# Patient Record
Sex: Male | Born: 1955
Health system: Southern US, Community
[De-identification: ages and names within clinical notes are randomized; demographics above are authoritative.]

## PROBLEM LIST (undated history)

## (undated) DIAGNOSIS — Z87448 Personal history of other diseases of urinary system: Secondary | ICD-10-CM

## (undated) DIAGNOSIS — N2581 Secondary hyperparathyroidism of renal origin: Secondary | ICD-10-CM

## (undated) DIAGNOSIS — I10 Essential (primary) hypertension: Secondary | ICD-10-CM

## (undated) DIAGNOSIS — E785 Hyperlipidemia, unspecified: Secondary | ICD-10-CM

## (undated) DIAGNOSIS — I639 Cerebral infarction, unspecified: Secondary | ICD-10-CM

## (undated) DIAGNOSIS — E0500 Thyrotoxicosis with diffuse goiter without thyrotoxic crisis or storm: Secondary | ICD-10-CM

## (undated) DIAGNOSIS — N059 Unspecified nephritic syndrome with unspecified morphologic changes: Secondary | ICD-10-CM

## (undated) DIAGNOSIS — R339 Retention of urine, unspecified: Secondary | ICD-10-CM

## (undated) DIAGNOSIS — H9190 Unspecified hearing loss, unspecified ear: Secondary | ICD-10-CM

## (undated) HISTORY — PX: OTHER SURGICAL HISTORY: SHX169

## (undated) HISTORY — DX: Secondary hyperparathyroidism of renal origin: N25.81

## (undated) HISTORY — DX: Cerebral infarction, unspecified: I63.9

## (undated) HISTORY — DX: Essential (primary) hypertension: I10

## (undated) HISTORY — DX: Hyperlipidemia, unspecified: E78.5

## (undated) HISTORY — DX: Personal history of other diseases of urinary system: Z87.448

## (undated) HISTORY — DX: Retention of urine, unspecified: R33.9

## (undated) HISTORY — DX: Unspecified hearing loss, unspecified ear: H91.90

## (undated) HISTORY — DX: Thyrotoxicosis with diffuse goiter without thyrotoxic crisis or storm: E05.00

## (undated) HISTORY — DX: Unspecified nephritic syndrome with unspecified morphologic changes: N05.9

---

## 1998-01-24 ENCOUNTER — Ambulatory Visit (HOSPITAL_COMMUNITY): Admission: RE | Admit: 1998-01-24 | Discharge: 1998-01-24 | Payer: Self-pay | Admitting: *Deleted

## 2002-11-07 ENCOUNTER — Encounter: Admission: RE | Admit: 2002-11-07 | Discharge: 2002-11-07 | Payer: Self-pay | Admitting: Otolaryngology

## 2002-11-07 ENCOUNTER — Encounter: Payer: Self-pay | Admitting: Otolaryngology

## 2003-03-13 ENCOUNTER — Emergency Department (HOSPITAL_COMMUNITY): Admission: AD | Admit: 2003-03-13 | Discharge: 2003-03-13 | Payer: Self-pay | Admitting: Emergency Medicine

## 2004-01-28 ENCOUNTER — Emergency Department (HOSPITAL_COMMUNITY): Admission: EM | Admit: 2004-01-28 | Discharge: 2004-01-28 | Payer: Self-pay | Admitting: Emergency Medicine

## 2005-06-06 LAB — HM COLONOSCOPY: HM Colonoscopy: 2

## 2005-06-27 ENCOUNTER — Encounter: Payer: Self-pay | Admitting: Internal Medicine

## 2006-01-26 ENCOUNTER — Ambulatory Visit: Payer: Self-pay | Admitting: Family Medicine

## 2006-01-26 ENCOUNTER — Ambulatory Visit: Payer: Self-pay | Admitting: Cardiology

## 2006-01-30 ENCOUNTER — Ambulatory Visit: Payer: Self-pay | Admitting: Family Medicine

## 2006-02-13 ENCOUNTER — Ambulatory Visit: Payer: Self-pay | Admitting: Family Medicine

## 2006-05-08 ENCOUNTER — Ambulatory Visit: Payer: Self-pay | Admitting: Family Medicine

## 2006-06-05 ENCOUNTER — Encounter: Admission: RE | Admit: 2006-06-05 | Discharge: 2006-09-03 | Payer: Self-pay | Admitting: Family Medicine

## 2006-08-23 DIAGNOSIS — I10 Essential (primary) hypertension: Secondary | ICD-10-CM | POA: Insufficient documentation

## 2006-08-23 DIAGNOSIS — E785 Hyperlipidemia, unspecified: Secondary | ICD-10-CM | POA: Insufficient documentation

## 2007-02-10 ENCOUNTER — Encounter (INDEPENDENT_AMBULATORY_CARE_PROVIDER_SITE_OTHER): Payer: Self-pay | Admitting: *Deleted

## 2007-08-23 ENCOUNTER — Telehealth (INDEPENDENT_AMBULATORY_CARE_PROVIDER_SITE_OTHER): Payer: Self-pay | Admitting: *Deleted

## 2007-08-30 ENCOUNTER — Ambulatory Visit: Payer: Self-pay | Admitting: Family Medicine

## 2007-08-30 LAB — CONVERTED CEMR LAB
Bilirubin Urine: NEGATIVE
Glucose, Urine, Semiquant: NEGATIVE
Ketones, urine, test strip: NEGATIVE
Protein, U semiquant: >=300
Specific Gravity, Urine: 1.025
Urobilinogen, UA: 0.2
pH: 5

## 2007-08-31 ENCOUNTER — Encounter (INDEPENDENT_AMBULATORY_CARE_PROVIDER_SITE_OTHER): Payer: Self-pay | Admitting: Family Medicine

## 2007-08-31 LAB — CONVERTED CEMR LAB
ALT: 29 units/L (ref 0–53)
AST: 21 units/L (ref 0–37)
BUN: 21 mg/dL (ref 6–23)
CO2: 29 meq/L (ref 19–32)
Calcium: 9.2 mg/dL (ref 8.4–10.5)
Chloride: 102 meq/L (ref 96–112)
Cholesterol: 206 mg/dL (ref 0–200)
Creatinine, Ser: 1.1 mg/dL (ref 0.4–1.5)
Direct LDL: 108.7 mg/dL
GFR calc Af Amer: 90 mL/min
GFR calc non Af Amer: 74 mL/min
Glucose, Bld: 156 mg/dL — ABNORMAL HIGH (ref 70–99)
HDL: 38.1 mg/dL — ABNORMAL LOW (ref >39.0)
Potassium: 3.6 meq/L (ref 3.5–5.1)
Sodium: 138 meq/L (ref 135–145)
Total CHOL/HDL Ratio: 5.4
Triglycerides: 282 mg/dL (ref 0–149)
VLDL: 56 mg/dL — ABNORMAL HIGH (ref 0–40)

## 2007-09-01 ENCOUNTER — Telehealth (INDEPENDENT_AMBULATORY_CARE_PROVIDER_SITE_OTHER): Payer: Self-pay | Admitting: *Deleted

## 2007-09-03 ENCOUNTER — Ambulatory Visit: Payer: Self-pay | Admitting: Family Medicine

## 2007-09-07 LAB — CONVERTED CEMR LAB
Direct LDL: 139.6 mg/dL
Glucose, Bld: 138 mg/dL — ABNORMAL HIGH (ref 70–99)
Triglycerides: 286 mg/dL (ref 0–149)

## 2007-09-08 ENCOUNTER — Encounter (INDEPENDENT_AMBULATORY_CARE_PROVIDER_SITE_OTHER): Payer: Self-pay | Admitting: Family Medicine

## 2007-09-08 ENCOUNTER — Telehealth (INDEPENDENT_AMBULATORY_CARE_PROVIDER_SITE_OTHER): Payer: Self-pay | Admitting: *Deleted

## 2007-09-17 ENCOUNTER — Ambulatory Visit: Payer: Self-pay | Admitting: Family Medicine

## 2007-09-20 ENCOUNTER — Encounter (INDEPENDENT_AMBULATORY_CARE_PROVIDER_SITE_OTHER): Payer: Self-pay | Admitting: *Deleted

## 2007-09-23 LAB — CONVERTED CEMR LAB
Creatinine,U: 136.9 mg/dL
Hgb A1c MFr Bld: 7.1 % — ABNORMAL HIGH (ref 4.6–6.0)
Microalb Creat Ratio: 325.8 mg/g — ABNORMAL HIGH (ref 0.0–30.0)
Microalb, Ur: 44.6 mg/dL — ABNORMAL HIGH (ref 0.0–1.9)

## 2007-09-24 ENCOUNTER — Encounter: Payer: Self-pay | Admitting: Internal Medicine

## 2007-10-01 ENCOUNTER — Ambulatory Visit: Payer: Self-pay | Admitting: Internal Medicine

## 2007-10-01 DIAGNOSIS — E119 Type 2 diabetes mellitus without complications: Secondary | ICD-10-CM | POA: Insufficient documentation

## 2007-10-22 ENCOUNTER — Encounter (INDEPENDENT_AMBULATORY_CARE_PROVIDER_SITE_OTHER): Payer: Self-pay | Admitting: *Deleted

## 2007-10-25 ENCOUNTER — Telehealth (INDEPENDENT_AMBULATORY_CARE_PROVIDER_SITE_OTHER): Payer: Self-pay | Admitting: *Deleted

## 2007-11-12 ENCOUNTER — Ambulatory Visit: Payer: Self-pay | Admitting: Internal Medicine

## 2007-11-12 LAB — CONVERTED CEMR LAB: Blood Glucose, Fasting: 130 mg/dL

## 2007-12-08 ENCOUNTER — Telehealth (INDEPENDENT_AMBULATORY_CARE_PROVIDER_SITE_OTHER): Payer: Self-pay | Admitting: *Deleted

## 2007-12-18 ENCOUNTER — Emergency Department (HOSPITAL_COMMUNITY): Admission: EM | Admit: 2007-12-18 | Discharge: 2007-12-18 | Payer: Self-pay | Admitting: Emergency Medicine

## 2007-12-20 ENCOUNTER — Emergency Department (HOSPITAL_COMMUNITY): Admission: EM | Admit: 2007-12-20 | Discharge: 2007-12-20 | Payer: Self-pay | Admitting: Emergency Medicine

## 2008-01-04 ENCOUNTER — Ambulatory Visit: Payer: Self-pay | Admitting: Internal Medicine

## 2008-01-04 LAB — CONVERTED CEMR LAB: Hemoglobin: 16.2 g/dL

## 2008-01-28 ENCOUNTER — Ambulatory Visit: Payer: Self-pay | Admitting: Internal Medicine

## 2008-02-02 ENCOUNTER — Telehealth (INDEPENDENT_AMBULATORY_CARE_PROVIDER_SITE_OTHER): Payer: Self-pay | Admitting: *Deleted

## 2008-02-02 LAB — CONVERTED CEMR LAB
BUN: 15 mg/dL (ref 6–23)
CO2: 28 meq/L (ref 19–32)
Calcium: 9 mg/dL (ref 8.4–10.5)
Chloride: 104 meq/L (ref 96–112)
Creatinine, Ser: 0.9 mg/dL (ref 0.4–1.5)
Creatinine,U: 161.4 mg/dL
GFR calc Af Amer: 114 mL/min
GFR calc non Af Amer: 94 mL/min
Glucose, Bld: 130 mg/dL — ABNORMAL HIGH (ref 70–99)
Hgb A1c MFr Bld: 6 % (ref 4.6–6.0)
Microalb, Ur: 322.1 mg/dL — ABNORMAL HIGH (ref 0.0–1.9)
Potassium: 3.4 meq/L — ABNORMAL LOW (ref 3.5–5.1)
Sodium: 139 meq/L (ref 135–145)

## 2008-05-26 ENCOUNTER — Ambulatory Visit: Payer: Self-pay | Admitting: Internal Medicine

## 2008-05-30 ENCOUNTER — Telehealth (INDEPENDENT_AMBULATORY_CARE_PROVIDER_SITE_OTHER): Payer: Self-pay | Admitting: *Deleted

## 2008-05-30 LAB — CONVERTED CEMR LAB
ALT: 24 units/L (ref 0–53)
AST: 23 units/L (ref 0–37)
Cholesterol: 216 mg/dL (ref 0–200)
Creatinine,U: 157.2 mg/dL
Direct LDL: 125.1 mg/dL
HDL: 51.6 mg/dL (ref >39.0)
Hgb A1c MFr Bld: 6.2 % — ABNORMAL HIGH (ref 4.6–6.0)
Microalb Creat Ratio: 1848.6 mg/g — ABNORMAL HIGH (ref 0.0–30.0)
Microalb, Ur: 290.6 mg/dL — ABNORMAL HIGH (ref 0.0–1.9)
Total CHOL/HDL Ratio: 4.2
Triglycerides: 107 mg/dL (ref 0–149)
VLDL: 21 mg/dL (ref 0–40)

## 2008-09-08 ENCOUNTER — Ambulatory Visit: Payer: Self-pay | Admitting: Internal Medicine

## 2008-09-13 ENCOUNTER — Telehealth (INDEPENDENT_AMBULATORY_CARE_PROVIDER_SITE_OTHER): Payer: Self-pay | Admitting: *Deleted

## 2008-09-13 LAB — CONVERTED CEMR LAB
BUN: 16 mg/dL (ref 6–23)
CO2: 29 meq/L (ref 19–32)
Calcium: 8.9 mg/dL (ref 8.4–10.5)
Chloride: 104 meq/L (ref 96–112)
Creatinine, Ser: 1.1 mg/dL (ref 0.4–1.5)
Creatinine,U: 118.6 mg/dL
GFR calc Af Amer: 90 mL/min
GFR calc non Af Amer: 74 mL/min
Glucose, Bld: 157 mg/dL — ABNORMAL HIGH (ref 70–99)
Hgb A1c MFr Bld: 6.7 % — ABNORMAL HIGH (ref 4.6–6.0)
Microalb Creat Ratio: 728.5 mg/g — ABNORMAL HIGH (ref 0.0–30.0)
Microalb, Ur: 86.4 mg/dL — ABNORMAL HIGH (ref 0.0–1.9)
Potassium: 3.6 meq/L (ref 3.5–5.1)
Sodium: 138 meq/L (ref 135–145)

## 2008-09-22 ENCOUNTER — Encounter: Payer: Self-pay | Admitting: Internal Medicine

## 2008-11-20 ENCOUNTER — Encounter: Payer: Self-pay | Admitting: Internal Medicine

## 2008-12-05 DIAGNOSIS — N059 Unspecified nephritic syndrome with unspecified morphologic changes: Secondary | ICD-10-CM

## 2008-12-05 HISTORY — DX: Unspecified nephritic syndrome with unspecified morphologic changes: N05.9

## 2008-12-08 ENCOUNTER — Ambulatory Visit (HOSPITAL_COMMUNITY): Admission: RE | Admit: 2008-12-08 | Discharge: 2008-12-08 | Payer: Self-pay | Admitting: Nephrology

## 2008-12-08 ENCOUNTER — Telehealth (INDEPENDENT_AMBULATORY_CARE_PROVIDER_SITE_OTHER): Payer: Self-pay | Admitting: *Deleted

## 2008-12-08 ENCOUNTER — Encounter (INDEPENDENT_AMBULATORY_CARE_PROVIDER_SITE_OTHER): Payer: Self-pay | Admitting: Interventional Radiology

## 2008-12-12 ENCOUNTER — Telehealth: Payer: Self-pay | Admitting: Internal Medicine

## 2008-12-29 ENCOUNTER — Ambulatory Visit: Payer: Self-pay | Admitting: Internal Medicine

## 2008-12-29 DIAGNOSIS — R809 Proteinuria, unspecified: Secondary | ICD-10-CM | POA: Insufficient documentation

## 2008-12-29 LAB — HM DIABETES FOOT EXAM

## 2009-01-01 LAB — CONVERTED CEMR LAB
BUN: 15 mg/dL (ref 6–23)
Basophils Absolute: 0 10*3/uL (ref 0.0–0.1)
Basophils Relative: 0.4 % (ref 0.0–3.0)
CO2: 28 meq/L (ref 19–32)
Calcium: 8.7 mg/dL (ref 8.4–10.5)
Chloride: 105 meq/L (ref 96–112)
Creatinine, Ser: 1 mg/dL (ref 0.4–1.5)
Eosinophils Absolute: 0.1 10*3/uL (ref 0.0–0.7)
Eosinophils Relative: 1 % (ref 0.0–5.0)
GFR calc non Af Amer: 82.66 mL/min (ref >60)
Glucose, Bld: 165 mg/dL — ABNORMAL HIGH (ref 70–99)
HCT: 40.8 % (ref 39.0–52.0)
Hemoglobin: 14.7 g/dL (ref 13.0–17.0)
Hgb A1c MFr Bld: 6.3 % (ref 4.6–6.5)
Lymphocytes Relative: 24.6 % (ref 12.0–46.0)
Lymphs Abs: 1.4 10*3/uL (ref 0.7–4.0)
MCHC: 35.9 g/dL (ref 30.0–36.0)
MCV: 84.5 fL (ref 78.0–100.0)
Monocytes Absolute: 0.4 10*3/uL (ref 0.1–1.0)
Monocytes Relative: 6.4 % (ref 3.0–12.0)
Neutro Abs: 3.8 10*3/uL (ref 1.4–7.7)
Neutrophils Relative %: 67.6 % (ref 43.0–77.0)
Platelets: 193 10*3/uL (ref 150.0–400.0)
Potassium: 3.5 meq/L (ref 3.5–5.1)
RBC: 4.83 M/uL (ref 4.22–5.81)
RDW: 12.5 % (ref 11.5–14.6)
Sodium: 139 meq/L (ref 135–145)
TSH: 1.64 microintl units/mL (ref 0.35–5.50)
WBC: 5.7 10*3/uL (ref 4.5–10.5)

## 2009-01-02 ENCOUNTER — Encounter: Admission: RE | Admit: 2009-01-02 | Discharge: 2009-01-02 | Payer: Self-pay | Admitting: Nephrology

## 2009-01-02 ENCOUNTER — Encounter: Payer: Self-pay | Admitting: Internal Medicine

## 2009-01-03 LAB — CONVERTED CEMR LAB: PSA: 1.6 ng/mL

## 2009-01-04 DIAGNOSIS — R339 Retention of urine, unspecified: Secondary | ICD-10-CM

## 2009-01-04 HISTORY — DX: Retention of urine, unspecified: R33.9

## 2009-01-11 ENCOUNTER — Encounter: Payer: Self-pay | Admitting: Internal Medicine

## 2009-01-16 ENCOUNTER — Ambulatory Visit: Payer: Self-pay | Admitting: Internal Medicine

## 2009-01-16 DIAGNOSIS — N052 Unspecified nephritic syndrome with diffuse membranous glomerulonephritis: Secondary | ICD-10-CM | POA: Insufficient documentation

## 2009-01-16 DIAGNOSIS — B009 Herpesviral infection, unspecified: Secondary | ICD-10-CM | POA: Insufficient documentation

## 2009-01-16 DIAGNOSIS — R3919 Other difficulties with micturition: Secondary | ICD-10-CM | POA: Insufficient documentation

## 2009-01-17 ENCOUNTER — Emergency Department (HOSPITAL_COMMUNITY): Admission: EM | Admit: 2009-01-17 | Discharge: 2009-01-17 | Payer: Self-pay | Admitting: Emergency Medicine

## 2009-01-19 ENCOUNTER — Encounter: Payer: Self-pay | Admitting: Internal Medicine

## 2009-01-26 ENCOUNTER — Encounter: Payer: Self-pay | Admitting: Internal Medicine

## 2009-02-01 ENCOUNTER — Encounter: Payer: Self-pay | Admitting: Internal Medicine

## 2009-08-31 ENCOUNTER — Ambulatory Visit: Payer: Self-pay | Admitting: Internal Medicine

## 2009-08-31 DIAGNOSIS — M25569 Pain in unspecified knee: Secondary | ICD-10-CM | POA: Insufficient documentation

## 2009-09-06 LAB — CONVERTED CEMR LAB
BUN: 25 mg/dL — ABNORMAL HIGH (ref 6–23)
CO2: 23 meq/L (ref 19–32)
Calcium: 9.2 mg/dL (ref 8.4–10.5)
Chloride: 106 meq/L (ref 96–112)
Creatinine, Ser: 1.4 mg/dL (ref 0.40–1.50)
Glucose, Bld: 120 mg/dL — ABNORMAL HIGH (ref 70–99)
Hgb A1c MFr Bld: 6 % (ref 4.6–6.1)
Potassium: 4 meq/L (ref 3.5–5.3)
Sodium: 141 meq/L (ref 135–145)

## 2009-09-21 ENCOUNTER — Encounter: Payer: Self-pay | Admitting: Internal Medicine

## 2009-11-20 ENCOUNTER — Ambulatory Visit: Payer: Self-pay | Admitting: Internal Medicine

## 2009-11-20 DIAGNOSIS — E0500 Thyrotoxicosis with diffuse goiter without thyrotoxic crisis or storm: Secondary | ICD-10-CM | POA: Insufficient documentation

## 2009-11-21 ENCOUNTER — Telehealth: Payer: Self-pay | Admitting: Internal Medicine

## 2009-11-23 LAB — CONVERTED CEMR LAB
ALT: 26 units/L (ref 0–53)
AST: 23 units/L (ref 0–37)
BUN: 20 mg/dL (ref 6–23)
Basophils Absolute: 0 10*3/uL (ref 0.0–0.1)
Basophils Relative: 0.5 % (ref 0.0–3.0)
CO2: 28 meq/L (ref 19–32)
Calcium: 9.2 mg/dL (ref 8.4–10.5)
Chloride: 100 meq/L (ref 96–112)
Cholesterol: 247 mg/dL — ABNORMAL HIGH (ref 0–200)
Creatinine, Ser: 1.1 mg/dL (ref 0.4–1.5)
Direct LDL: 157.5 mg/dL
Eosinophils Absolute: 0.1 10*3/uL (ref 0.0–0.7)
Eosinophils Relative: 1.1 % (ref 0.0–5.0)
GFR calc non Af Amer: 72.28 mL/min (ref >60)
Glucose, Bld: 100 mg/dL — ABNORMAL HIGH (ref 70–99)
HCT: 44.2 % (ref 39.0–52.0)
HDL: 53.6 mg/dL (ref >39.00)
Hemoglobin: 15.2 g/dL (ref 13.0–17.0)
Hgb A1c MFr Bld: 5.8 % (ref 4.6–6.5)
Lymphocytes Relative: 27.2 % (ref 12.0–46.0)
Lymphs Abs: 1.9 10*3/uL (ref 0.7–4.0)
MCHC: 34.4 g/dL (ref 30.0–36.0)
MCV: 88.5 fL (ref 78.0–100.0)
Monocytes Absolute: 0.4 10*3/uL (ref 0.1–1.0)
Monocytes Relative: 5.9 % (ref 3.0–12.0)
Neutro Abs: 4.5 10*3/uL (ref 1.4–7.7)
Neutrophils Relative %: 65.3 % (ref 43.0–77.0)
PSA: 1.95 ng/mL (ref 0.10–4.00)
Platelets: 214 10*3/uL (ref 150.0–400.0)
Potassium: 3.9 meq/L (ref 3.5–5.1)
RBC: 4.99 M/uL (ref 4.22–5.81)
RDW: 13.5 % (ref 11.5–14.6)
Sodium: 139 meq/L (ref 135–145)
Total CHOL/HDL Ratio: 5
Triglycerides: 191 mg/dL — ABNORMAL HIGH (ref 0.0–149.0)
VLDL: 38.2 mg/dL (ref 0.0–40.0)
WBC: 6.8 10*3/uL (ref 4.5–10.5)

## 2009-12-21 ENCOUNTER — Encounter: Admission: RE | Admit: 2009-12-21 | Discharge: 2009-12-21 | Payer: Self-pay | Admitting: Internal Medicine

## 2010-05-14 ENCOUNTER — Encounter (INDEPENDENT_AMBULATORY_CARE_PROVIDER_SITE_OTHER): Payer: Self-pay | Admitting: *Deleted

## 2010-06-07 ENCOUNTER — Ambulatory Visit: Payer: Self-pay | Admitting: Internal Medicine

## 2010-06-13 LAB — CONVERTED CEMR LAB
Cholesterol: 248 mg/dL — ABNORMAL HIGH (ref 0–200)
HDL: 54 mg/dL (ref >39)
Hgb A1c MFr Bld: 6 % — ABNORMAL HIGH (ref <5.7)
LDL Cholesterol: 165 mg/dL — ABNORMAL HIGH (ref 0–99)
Total CHOL/HDL Ratio: 4.6
Triglycerides: 147 mg/dL (ref <150)
VLDL: 29 mg/dL (ref 0–40)

## 2010-08-08 NOTE — Assessment & Plan Note (Signed)
Summary: CONSTIPATION/KDC   Vital Signs:  Patient profile:   55 year old male Height:      73 inches Weight:      230 pounds Temp:     99 degrees F oral Pulse rate:   60 / minute Pulse rhythm:   regular BP sitting:   160 / 100  (left arm) Cuff size:   large  Vitals Entered By: Shary Decamp (January 16, 2009 1:59 PM) CC: constipation Is Patient Diabetic? Yes   History of Present Illness: Several issues: -- had constipation last week, now better w/ MOM -- developed a rash R from the anal area, no pain or itching, went to [facility], results form the UC reviewed:   a test confirmed Herpes in the perianal lesions, was Rx valtrex --has some difficulty voiding x few days  -- recent OV note and labs from nephrology reviewed: was Dx w/ Bx proven GN  , his nephrologist would like to "r/o malignancy"   Current Medications (verified): 1)  Simvastatin 80 Mg Tabs (Simvastatin) .Marland Kitchen.. 1 By Mouth Qd 2)  Lisinopril 5 Mg Tabs (Lisinopril) .Marland Kitchen.. 1 By Mouth Once Daily 3)  Freestyle Lite Test   Strp (Glucose Blood) .... Check Blood Sugar 2x/day 4)  Freestyle Lite Lancets .... Check Blood Sugar 2x/day 5)  Aspirin 81 Mg Tbec (Aspirin) .Marland Kitchen.. 1 A Day  Allergies (verified): 1)  ! Penicillin  Past History:  Past Medical History: AODM Dx 2009 HYPERTENSION   Bx proven membranous glomerulonephritis ----Dx 12-2008 HYPERLIPIDEMIA   h/o hematuria, saw urology 3-09: neg w/u screening Cscope 12-06 Dr Bosie Clos:  2 polyps , repeat in 5 years per report    Review of Systems General:  Denies chills and fever. CV:  Denies swelling of feet. GI:  Denies bloody stools, nausea, and vomiting. GU:  Denies dysuria, hematuria, and urinary frequency.  Physical Exam  General:  alert and well-developed.   Lungs:  normal respiratory effort, no intercostal retractions, no accessory muscle use, and normal breath sounds.   Heart:  normal rate, regular rhythm, and no murmur.   Abdomen:  soft, non-tender, no distention, no  masses, no guarding, and no rigidity.   Rectal:  No external abnormalities noted. Normal sphincter tone. No rectal masses or tenderness. 2 superficial red lesion aprox. 2mm noted R from the anus  Prostate:  Prostate gland firm and smooth, slt  enlargement, no nodularity, tenderness, mass, asymmetry or induration.   Impression & Recommendations:  Problem # 1:  HERPES SIMPLEX INFECTION (ICD-054.9) Assessment New herpex around the anus, rec continue  w/ valtrex   Problem # 2:  VOIDING HESITANCY (ZOX-096.04) Assessment: New related to #1? prostatitis? recent PSA 1.6 unable to get a Urine specimen today, see instructions  consider Abx ,pt to call if sx worsen   Problem # 3:  MEMBRANOUS GLOMERULONEPHRITIS (ICD-583.1) Assessment: New new Dx , nephrology suggested "r/o malignancy" , otherwise conservative treatment  recent PSA ok patient reports they did a CXR and a thyroid u/s , results? needs a Cscope : advised to get a physical to discuss further  Problem # 4:  HYPERTENSION (ICD-401.9) BP slightly  elevated today, usually ok , no change  His updated medication list for this problem includes:    Lisinopril 5 Mg Tabs (Lisinopril) .Marland Kitchen... 1 by mouth once daily  BP today: 160/100 Prior BP: 138/76 (12/29/2008)  Labs Reviewed: K+: 3.5 (12/29/2008) Creat: : 1.0 (12/29/2008)   Chol: 216 (05/26/2008)   HDL: 51.6 (05/26/2008)   LDL: DEL (  05/26/2008)   TG: 107 (05/26/2008)  Complete Medication List: 1)  Simvastatin 80 Mg Tabs (Simvastatin) .Marland Kitchen.. 1 by mouth qd 2)  Lisinopril 5 Mg Tabs (Lisinopril) .Marland Kitchen.. 1 by mouth once daily 3)  Freestyle Lite Test Strp (Glucose blood) .... Check blood sugar 2x/day 4)  Freestyle Lite Lancets  .... Check blood sugar 2x/day 5)  Aspirin 81 Mg Tbec (Aspirin) .Marland Kitchen.. 1 a day 6)  Flomax 0.4 Mg Xr24h-cap (Tamsulosin hcl) .Marland Kitchen.. 1 by mouth  once daily  Patient Instructions: 1)  bring a urine sample 2)  UA UCX  --- Dx 788.69 3)  came back for a physical in 1  month Prescriptions: FLOMAX 0.4 MG XR24H-CAP (TAMSULOSIN HCL) 1 by mouth  once daily  #30 x 1   Entered and Authorized by:   Elita Quick E. Nakeysha Pasqual MD   Signed by:   Nolon Rod. Tokiko Diefenderfer MD on 01/16/2009   Method used:   Print then Give to Patient   RxID:   [id number]

## 2010-08-08 NOTE — Assessment & Plan Note (Signed)
Summary: 6 month followup--bumped from 11/18///sph   Vital Signs:  Patient profile:   55 year old male Weight:      225.25 pounds Pulse rate:   92 / minute Pulse rhythm:   regular BP sitting:   134 / 88  (left arm) Cuff size:   large  Vitals Entered By: Army Fossa CMA (June 07, 2010 2:27 PM) CC: Pt here for 6 month f/u- not ate since 5am. Comments [facility] [address]   History of Present Illness:  ROV  AODM-- diet only , no recent ambulatory CBGs  HYPERTENSION  -- ambulatory BPs < 139/85  membranous glomerulonephritis , to be seen again by nephroilogy  ~ 07-2010    Current Medications (verified): 1)  Lisinopril 5 Mg Tabs (Lisinopril) .Marland Kitchen.. 1 By Mouth Once Daily  Allergies (verified): 1)  ! Penicillin  Past History:  Past Medical History: AODM Dx 2009 HYPERTENSION   Bx proven membranous glomerulonephritis ----Dx 12-2008 HYPERLIPIDEMIA   h/o hematuria, saw urology 3-09: neg w/u history of urinary retention 01/2009, was seen at the ER and followup by urology screening Cscope 12-06 Dr Bosie Clos:  2 polyps , repeat in 5 years per report   reportedly, history of Graves' disease in the past--u/s 6-11 slightly  enlarged gland w/o nodules  Past Surgical History: Reviewed history from 11/20/2009 and no changes required. no major surgeries   Social History: Reviewed history from 11/20/2009 and no changes required. 3 children Married original from [place] moved to [place] in the 80s works , Psychologist, occupational quit tobaco in the 90s, used to smoke aprox 1 ppd   Review of Systems CV:  Denies chest pain or discomfort and swelling of feet. GU:  Denies dysuria, hematuria, urinary frequency, and urinary hesitancy.  Physical Exam  General:  alert and well-developed.   Lungs:  normal respiratory effort, no intercostal retractions, no accessory muscle use, and normal breath sounds.   Heart:  normal rate, regular rhythm, and no murmur.    Extremities:  no lower extremity  edema   Impression & Recommendations:  Problem # 1:  Hx of GRAVES' DISEASE (ICD-242.00) u/s 6-11 showed enlarged thyroid w/o nodules   Problem # 2:  KNEE PAIN, ACUTE (ICD-719.46) Resolved, saw ortho, had a local shot per patient   Problem # 3:  AODM (ICD-250.00)  diet only  His updated medication list for this problem includes:    Lisinopril 5 Mg Tabs (Lisinopril) .Marland Kitchen... 1 by mouth once daily    Aspirin 81 Mg Tbec (Aspirin) .Marland Kitchen... 1 a day  Labs Reviewed: Creat: 1.1 (11/20/2009)    Reviewed HgBA1c results: 5.8 (11/20/2009)  6.0 (08/31/2009)  Orders: Specimen Handling (09604)  Problem # 4:  HYPERLIPIDEMIA (ICD-272.4)  fast > 8 hours, labs  Labs Reviewed: SGOT: 23 (11/20/2009)   SGPT: 26 (11/20/2009)   HDL:53.60 (11/20/2009), 51.6 (05/26/2008)  LDL:DEL (05/26/2008), DEL (08/30/2007)  Chol:247 (11/20/2009), 216 (05/26/2008)  Trig:191.0 (11/20/2009), 107 (05/26/2008)  Orders: Venipuncture (54098) Specimen Handling (11914)  Complete Medication List: 1)  Lisinopril 5 Mg Tabs (Lisinopril) .Marland Kitchen.. 1 by mouth once daily 2)  Freestyle Lite Test Strp (Glucose blood) .... Check blood sugar 2x/day 3)  Freestyle Lite Lancets  .... Check blood sugar 2x/day 4)  Aspirin 81 Mg Tbec (Aspirin) .Marland Kitchen.. 1 a day  Patient Instructions: 1)  Please schedule a follow-up appointment in 6  months .  Prescriptions: LISINOPRIL 5 MG TABS (LISINOPRIL) 1 by mouth once daily  #30 Each x 5   Entered by:   Duwayne Heck  Barmer CMA   Authorized by:   Nolon Rod. Paz MD   Signed by:   Army Fossa CMA on 06/07/2010   Method used:   Electronically to        [facility] [phone]* (retail)       [address]       [place], [place]  [address]       Ph: [phone]       Fax: [phone]   RxID:   [id number]    Orders Added: 1)  Venipuncture [44034] 2)  Specimen Handling [99000] 3)  Est. Patient Level III [74259]   Immunization History:  Influenza Immunization History:    Influenza:  historical  (05/07/2010)   Immunization History:  Influenza Immunization History:    Influenza:  Historical (05/07/2010)

## 2010-08-08 NOTE — Progress Notes (Signed)
Summary: labs  Phone Note Outgoing Call   Summary of Call: [facility] All labs good, LDL slightly elevated, recommend diet and recheck FLP in 3 months. We'll send a letter as well. Mazi Brailsford E. Adabella Stanis MD  Nov 23, 2009 11:05 AM

## 2010-08-08 NOTE — Assessment & Plan Note (Signed)
Summary: CPX/KDC   Vital Signs:  Patient profile:   55 year old male Height:      73 inches Weight:      222 pounds BMI:     29.40 Pulse rate:   64 / minute BP sitting:   120 / 90  Vitals Entered By: Kandice Hams (Nov 20, 2009 12:59 PM) CC: cpx//  need refill of lancets and strips   History of Present Illness: CPX  still has knee pain, saw ortho, taking meloxicam   Allergies: 1)  ! Penicillin  Past History:  Past Medical History: AODM Dx 2009 HYPERTENSION   Bx proven membranous glomerulonephritis ----Dx 12-2008 HYPERLIPIDEMIA   h/o hematuria, saw urology 3-09: neg w/u history of urinary retention 01/2009, was seen at the ER and followup by urology screening Cscope 12-06 Dr Bosie Clos:  2 polyps , repeat in 5 years per report   reportedly, history of Graves' disease in the past  Past Surgical History: no major surgeries   Family History: DM-- GM MI-- no colon ca-- no prostate ca- no  Social History: 3 children Married original from [place] moved to [place] in the 80s works , Psychologist, occupational quit tobaco in the 90s, used to smoke aprox 1 ppd   Review of Systems CV:  Denies chest pain or discomfort and swelling of feet. Resp:  Denies cough and shortness of breath. GI:  Denies bloody stools, diarrhea, nausea, and vomiting. GU:  Denies hematuria, urinary frequency, and urinary hesitancy. Endo:  no recent ambulatory CBGs .  Physical Exam  General:  alert and well-developed.   Neck:  no masses, no thyromegaly, and normal carotid upstroke.   Lungs:  normal respiratory effort, no intercostal retractions, no accessory muscle use, and normal breath sounds.   Heart:  normal rate, regular rhythm, and no murmur.    Abdomen:  soft, non-tender, no distention, no masses, no guarding, and no rigidity.   Rectal:  No external abnormalities noted. Normal sphincter tone. No rectal masses or tenderness. Prostate:  exam limited by patient discomfort, I was barely able to feel the prostate but  it seems to be normal Extremities:  no lower extremity edema Psych:  Cognition and judgment appear intact. Alert and cooperative with normal attention span and concentration.  not anxious appearing and not depressed appearing.     Impression & Recommendations:  Problem # 1:  HEALTH SCREENING (ICD-V70.0) Td today Cscope 12-06 Dr Bosie Clos:  2 polyps , repeat in 5 years per report    labs Continue with healthy lifestyle  Orders: Venipuncture (16109) TLB-CBC Platelet - w/Differential (85025-CBCD) TLB-ALT (SGPT) (84460-ALT) TLB-AST (SGOT) (84450-SGOT) TLB-Lipid Panel (80061-LIPID) TLB-PSA (Prostate Specific Antigen) (84153-PSA)  Problem # 2:  MEMBRANOUS GLOMERULONEPHRITIS (ICD-583.1) the patient is follow up closely by renal He's taking meloxicam for knee pain, I advised the patient to discontinue that as it is nephrotoxic stay on Tylenol or Ultram, prescription for Ultram provided  Orders: TLB-BMP (Basic Metabolic Panel-BMET) (80048-METABOL)  Problem # 3:  Hx of GRAVES' DISEASE (ICD-242.00)  patient reports a history of Graves' disease  in one of his notes, nephrology recommended a full evaluation of his thyroid because sometimes membranous glomerulonephritis is associated with cancer and they felt a nodule in the right thigh thyroid few months ago On exam today, there is no enlargement of the thyroid or nodules that I can tell Plan:  thyroid  ultrasound  Orders: Radiology Referral (Radiology)  Complete Medication List: 1)  Lisinopril 5 Mg Tabs (Lisinopril) .Marland Kitchen.. 1 by mouth  once daily 2)  Freestyle Lite Test Strp (Glucose blood) .... Check blood sugar 2x/day 3)  Freestyle Lite Lancets  .... Check blood sugar 2x/day 4)  Aspirin 81 Mg Tbec (Aspirin) .Marland Kitchen.. 1 a day 5)  Ultram 50 Mg Tabs (Tramadol hcl) .... One by mouth 3 times a day as needed for pain  Other Orders: TLB-A1C / Hgb A1C (Glycohemoglobin) (83036-A1C) Tdap => 37yrs IM (16109) Admin 1st Vaccine (60454) Admin 1st  Vaccine [facility]) [phone])  Patient Instructions: 1)  stop meloxicam, it may affect your kidney  2)  Take 1000 mg of tylenol every 6 hours as needed for relief of pain.. Avoid taking more than 4000 mg in a 24 hour period( can cause liver damage in higher doses).  3)  if the pain is severe, then take Ultram 4)  Please schedule a follow-up appointment in 6 months .  Prescriptions: ULTRAM 50 MG TABS (TRAMADOL HCL) one by mouth 3 times a day as needed for pain  #60 x 0   Entered and Authorized by:   Nolon Rod. Neng Albee MD   Signed by:   Nolon Rod. Devera Englander MD on 11/20/2009   Method used:   Print then Give to Patient   RxID:   [id number]    Tetanus/Td Vaccine    Vaccine Type: Tdap    Site: right deltoid    Mfr: GlaxoSmithKline    Dose: 0.5 ml    Route: IM    Given by: Doristine Devoid    Exp. Date: 09/29/2011    Lot #: [id number]

## 2010-08-08 NOTE — Assessment & Plan Note (Signed)
Summary: NEEDS BUMP CHECKED/NTA   Vital Signs:  Patient profile:   55 year old male Weight:      223 pounds Pulse rate:   72 / minute BP sitting:   130 / 80  (left arm)  Vitals Entered By: Doristine Devoid (August 31, 2009 3:16 PM) CC: R knee lump tender x2 wks and also some discomfort in L knee    History of Present Illness: two weeks ago at work  he slipped going up stairs and twisted his right knee.  Ever since he has pain @  the external aspect of the knee and some swelling he went to urgent care, he was prescribed an anti-inflammatory which helped  very little he has also tried knee sleeve which doesn't help a few days ago, the  left knee started to hurt  as well, thinks is  because he is putting more pressure to compensate for the pain in the right knee  Allergies: 1)  ! Penicillin  Past History:  Past Medical History: Reviewed history from 01/16/2009 and no changes required. AODM Dx 2009 HYPERTENSION   Bx proven membranous glomerulonephritis ----Dx 12-2008 HYPERLIPIDEMIA   h/o hematuria, saw urology 3-09: neg w/u screening Cscope 12-06 Dr Bosie Clos:  2 polyps , repeat in 5 years per report    Review of Systems       he unfortunately lost his insurance temporarily as far as his cholesterol he had to discontinue simvastatinas far as his diabetes, he check his CBG from time to time and is usually in the 140 to 150 range  Physical Exam  General:  alert and well-developed.  alert and well-developed.   Lungs:  normal respiratory effort, no intercostal retractions, no accessory muscle use, and normal breath sounds.    Heart:  normal rate, regular rhythm, and no murmur.    Extremities:  no lower extremity edema right knee: no warm, e mild swelling at the external aspect of the knee with some tenderness  to pressure.  Range of motion is full left knee normal   Impression & Recommendations:  Problem # 1:  KNEE PAIN, ACUTE (ICD-719.46)  knee injury refer to ortho   he  has a history of kidney disease, will recommend to avoid anti-inflammatories, take Tylenol only  His updated medication list for this problem includes:    Aspirin 81 Mg Tbec (Aspirin) .Marland Kitchen... 1 a day  Orders: Orthopedic Referral (Ortho)  Problem # 2:  AODM (ICD-250.00) labs  His updated medication list for this problem includes:    Lisinopril 5 Mg Tabs (Lisinopril) .Marland Kitchen... 1 by mouth once daily    Aspirin 81 Mg Tbec (Aspirin) .Marland Kitchen... 1 a day  Orders: Venipuncture (16109)  Labs Reviewed: Creat: 1.0 (12/29/2008)    Reviewed HgBA1c results: 6.3 (12/29/2008)  6.7 (09/08/2008)  Problem # 3:  HYPERLIPIDEMIA (ICD-272.4) off cholesterol medication for a while reassess on return to the office The following medications were removed from the medication list:    Simvastatin 80 Mg Tabs (Simvastatin) .Marland Kitchen... 1 by mouth qd  Labs Reviewed: SGOT: 23 (05/26/2008)   SGPT: 24 (05/26/2008)   HDL:51.6 (05/26/2008), 38.1 (08/30/2007)  LDL:DEL (05/26/2008), DEL (08/30/2007)  Chol:216 (05/26/2008), 206 (08/30/2007)  Trig:107 (05/26/2008), 286 (09/03/2007)  The following medications were removed from the medication list:    Simvastatin 80 Mg Tabs (Simvastatin) .Marland Kitchen... 1 by mouth qd  Problem # 4:  HYPERTENSION (ICD-401.9)  at goal  His updated medication list for this problem includes:    Lisinopril 5 Mg  Tabs (Lisinopril) .Marland Kitchen... 1 by mouth once daily  BP today: 130/80 Prior BP: 160/100 (01/16/2009)  Labs Reviewed: K+: 3.5 (12/29/2008) Creat: : 1.0 (12/29/2008)   Chol: 216 (05/26/2008)   HDL: 51.6 (05/26/2008)   LDL: DEL (05/26/2008)   TG: 107 (05/26/2008)    His updated medication list for this problem includes:    Lisinopril 5 Mg Tabs (Lisinopril) .Marland Kitchen... 1 by mouth once daily  Complete Medication List: 1)  Lisinopril 5 Mg Tabs (Lisinopril) .Marland Kitchen.. 1 by mouth once daily 2)  Freestyle Lite Test Strp (Glucose blood) .... Check blood sugar 2x/day 3)  Freestyle Lite Lancets  .... Check blood sugar  2x/day 4)  Aspirin 81 Mg Tbec (Aspirin) .Marland Kitchen.. 1 a day 5)  Glucosamine Chondr 500 Complex Caps (Glucosamine-chondroit-vit c-mn) .... Take one table daily  Patient Instructions: 1)  for pain, take Tylenol 500 mg one or two tablets every 6 hours as needed 2)  come back fasting  in a month or two for a physical

## 2010-08-08 NOTE — Letter (Signed)
Summary: Primary Care Appointment Letter  [facility] [place]  [address] [place], [place] [address]   Phone: [phone]  Fax: [phone]    05/14/2010 MRN: [id number]  [facility] [address] [place], [place]  [address]  Dear Mr. CHAIRES,   Your Primary Care Physician Oreminea E. Paz MD has indicated that:    _______it is time to schedule an appointment.    _______you missed your appointment on______ and need to call and          reschedule.    _______you need to have lab work done.    _______you need to schedule an appointment discuss lab or test results.    __xxx_____you need to call to reschedule your appointment that is                       scheduled on 05/24/2010.  Dr Drue Novel will not be in the office that day.     Please call our office as soon as possible. Our phone number is [phone]. Our office is open 8a-12noon and 1p-5p, Monday through Friday.     Thank you,     Sarah at [facility] [address]   [facility]

## 2010-08-08 NOTE — Letter (Signed)
Summary: [facility]  [facility]   Imported By: Lanelle Bal 10/15/2009 08:20:10  _____________________________________________________________________  External Attachment:    Type:   Image     Comment:   External Document

## 2010-09-18 ENCOUNTER — Encounter: Payer: Self-pay | Admitting: Internal Medicine

## 2010-09-18 ENCOUNTER — Ambulatory Visit (INDEPENDENT_AMBULATORY_CARE_PROVIDER_SITE_OTHER): Payer: BC Managed Care – PPO | Admitting: Internal Medicine

## 2010-09-18 DIAGNOSIS — R109 Unspecified abdominal pain: Secondary | ICD-10-CM

## 2010-09-19 ENCOUNTER — Encounter: Payer: Self-pay | Admitting: Internal Medicine

## 2010-09-20 ENCOUNTER — Other Ambulatory Visit: Payer: Self-pay | Admitting: Internal Medicine

## 2010-09-20 DIAGNOSIS — R1011 Right upper quadrant pain: Secondary | ICD-10-CM

## 2010-09-24 ENCOUNTER — Other Ambulatory Visit: Payer: BC Managed Care – PPO

## 2010-09-24 NOTE — Assessment & Plan Note (Signed)
Summary: pain stomach/cbs so much back ground noise couldnt hear pt/cbs   Vital Signs:  Patient profile:   55 year old male Height:      73 inches Weight:      219.38 pounds BMI:     29.05 Pulse rate:   78 / minute Pulse rhythm:   regular BP sitting:   132 / 88  (left arm) Cuff size:   large  Vitals Entered By: Army Fossa CMA (September 18, 2010 2:36 PM) CC: Pain in upper stomach, hearing an echo in ear Comments pain in stomach only when eating not fasting [employer] [address]    History of Present Illness:  three-day history of epigastric pain , only postprandial, last about one hour, described as colicky and not burning. The only associated symptoms is mild nausea   Also, 2 weeks history of a ill-defined  left ear discomfort ("echo ")   ROS  no fevers  no diarrhea No change in the color of the stools Has not been taking any Motrin or Motrin like medication No heartburn or odynophagia  Denies any ear pain or ear discharge  Current Medications (verified): 1)  Lisinopril 5 Mg Tabs (Lisinopril) .Marland Kitchen.. 1 By Mouth Once Daily 2)  Freestyle Lite Test   Strp (Glucose Blood) .... Check Blood Sugar 2x/day 3)  Freestyle Lite Lancets .... Check Blood Sugar 2x/day 4)  Aspirin 81 Mg Tbec (Aspirin) .Marland Kitchen.. 1 A Day 5)  Lipitor 20 Mg Tabs (Atorvastatin Calcium) .Marland Kitchen.. 1 By Mouth At Bedtime.  Allergies (verified): 1)  ! Penicillin  Past History:  Past Medical History: AODM Dx 2009 HYPERTENSION   Bx proven membranous glomerulonephritis ----Dx 12-2008 HYPERLIPIDEMIA   h/o hematuria, saw urology 3-09: neg w/u history of urinary retention 01/2009, was seen at the ER and followup by urology screening Cscope 12-06 Dr Bosie Clos:  2 polyps , repeat in 5 years per report   reportedly, history of Graves' disease in the past--u/s 6-11 slightly  enlarged gland w/o nodules HOH R side   Past Surgical History: Reviewed history from 11/20/2009 and no changes required. no major surgeries   Social  History: Reviewed history from 11/20/2009 and no changes required. 3 children Married original from [place] moved to [place] in the 80s works , Psychologist, occupational quit tobaco in the 90s, used to smoke aprox 1 ppd   Physical Exam  General:  alert and well-developed.   Head:   face is symmetric, not tender to palpation Ears:  R ear normal and L ear normal.   Lungs:  normal respiratory effort, no intercostal retractions, no accessory muscle use, and normal breath sounds.   Heart:  normal rate, regular rhythm, and no murmur.    Abdomen:   not distended, soft, tender @ the  right upper quadrant > epigastrium.  no mass or rebound, normal bowel sounds. Very mild discomfort  at the right lower quadrant     Impression & Recommendations:  Problem # 1:  ABDOMINAL PAIN OTHER SPECIFIED SITE (ICD-789.09)  upper abdominal  pain, more tender at the right upper quadrant than the epigastrium   DDX : Gastritis, PUD, cholelithiasis Plan: PPIs, samples of Prilosec provided, to take twice a day Bland diet Patient will call if symptoms severe, fever, blood in the stools  ultrasound Reassess in 2 weeks  His updated medication list for this problem includes:    Aspirin 81 Mg Tbec (Aspirin) .Marland Kitchen... 1 a day  Orders: Radiology Referral (Radiology)  Problem # 2:   ear discomfort  exam negative, reassess in 2 weeks, patient would be interested to see ENT if the problems continue.  Complete Medication List: 1)  Lisinopril 5 Mg Tabs (Lisinopril) .Marland Kitchen.. 1 by mouth once daily 2)  Freestyle Lite Test Strp (Glucose blood) .... Check blood sugar 2x/day 3)  Freestyle Lite Lancets  .... Check blood sugar 2x/day 4)  Aspirin 81 Mg Tbec (Aspirin) .Marland Kitchen.. 1 a day 5)  Lipitor 20 Mg Tabs (Atorvastatin calcium) .Marland Kitchen.. 1 by mouth at bedtime.  Patient Instructions: 1)  prilosec 20mg  1 tableta antes del desayuno y [place] antes del acena 2)  dieta blanda 3)  regrese en 2 semamas 4)  llame si los simtomas son severos, tiene algun sangrado 5)   Please schedule a follow-up appointment in 2 weeks.    Orders Added: 1)  Est. Patient Level III [16109] 2)  Radiology Referral [Radiology]

## 2010-10-02 ENCOUNTER — Ambulatory Visit: Payer: BC Managed Care – PPO | Admitting: Internal Medicine

## 2010-10-07 ENCOUNTER — Other Ambulatory Visit: Payer: BC Managed Care – PPO

## 2010-10-13 LAB — URINALYSIS, ROUTINE W REFLEX MICROSCOPIC
Bilirubin Urine: NEGATIVE
Glucose, UA: 100 mg/dL — AB
Hgb urine dipstick: NEGATIVE
Ketones, ur: NEGATIVE mg/dL
Leukocytes, UA: NEGATIVE
Nitrite: NEGATIVE
Protein, ur: 100 mg/dL — AB
Specific Gravity, Urine: 1.021 (ref 1.005–1.030)
Urobilinogen, UA: 1 mg/dL (ref 0.0–1.0)
pH: 6.5 (ref 5.0–8.0)

## 2010-10-13 LAB — BASIC METABOLIC PANEL
BUN: 12 mg/dL (ref 6–23)
CO2: 28 mEq/L (ref 19–32)
Calcium: 8.8 mg/dL (ref 8.4–10.5)
Chloride: 103 mEq/L (ref 96–112)
Creatinine, Ser: 0.91 mg/dL (ref 0.4–1.5)
GFR calc Af Amer: >60 mL/min (ref >60)
GFR calc non Af Amer: >60 mL/min (ref >60)
Glucose, Bld: 200 mg/dL — ABNORMAL HIGH (ref 70–99)
Potassium: 3.8 mEq/L (ref 3.5–5.1)
Sodium: 135 mEq/L (ref 135–145)

## 2010-10-13 LAB — CBC
HCT: 42.5 % (ref 39.0–52.0)
Hemoglobin: 14.7 g/dL (ref 13.0–17.0)
MCHC: 34.6 g/dL (ref 30.0–36.0)
MCV: 86.4 fL (ref 78.0–100.0)
Platelets: 166 10*3/uL (ref 150–400)
RBC: 4.92 MIL/uL (ref 4.22–5.81)
RDW: 13.2 % (ref 11.5–15.5)
WBC: 5.2 10*3/uL (ref 4.0–10.5)

## 2010-10-13 LAB — DIFFERENTIAL
Basophils Absolute: 0 10*3/uL (ref 0.0–0.1)
Basophils Relative: 0 % (ref 0–1)
Eosinophils Absolute: 0.1 10*3/uL (ref 0.0–0.7)
Eosinophils Relative: 1 % (ref 0–5)
Lymphocytes Relative: 26 % (ref 12–46)
Lymphs Abs: 1.3 10*3/uL (ref 0.7–4.0)
Monocytes Absolute: 0.4 10*3/uL (ref 0.1–1.0)
Monocytes Relative: 7 % (ref 3–12)
Neutro Abs: 3.4 10*3/uL (ref 1.7–7.7)
Neutrophils Relative %: 66 % (ref 43–77)

## 2010-10-13 LAB — URINE MICROSCOPIC-ADD ON

## 2010-10-13 LAB — HEMOCCULT GUIAC POC 1CARD (OFFICE): Fecal Occult Bld: NEGATIVE

## 2010-10-14 LAB — CBC
HCT: 42.3 % (ref 39.0–52.0)
Hemoglobin: 15 g/dL (ref 13.0–17.0)
MCHC: 35.5 g/dL (ref 30.0–36.0)
MCV: 85.4 fL (ref 78.0–100.0)
Platelets: 178 10*3/uL (ref 150–400)
RBC: 4.95 MIL/uL (ref 4.22–5.81)
RDW: 13.3 % (ref 11.5–15.5)
WBC: 4.7 10*3/uL (ref 4.0–10.5)

## 2010-10-14 LAB — GLUCOSE, CAPILLARY
Glucose-Capillary: 135 mg/dL — ABNORMAL HIGH (ref 70–99)
Glucose-Capillary: 187 mg/dL — ABNORMAL HIGH (ref 70–99)

## 2010-10-14 LAB — PROTIME-INR
INR: 1 (ref 0.00–1.49)
Prothrombin Time: 13.6 seconds (ref 11.6–15.2)

## 2010-10-14 LAB — APTT: aPTT: 27 seconds (ref 24–37)

## 2010-10-21 ENCOUNTER — Encounter: Payer: Self-pay | Admitting: Internal Medicine

## 2010-12-13 ENCOUNTER — Ambulatory Visit: Payer: Self-pay | Admitting: Internal Medicine

## 2010-12-20 ENCOUNTER — Ambulatory Visit (INDEPENDENT_AMBULATORY_CARE_PROVIDER_SITE_OTHER): Payer: BC Managed Care – PPO | Admitting: Internal Medicine

## 2010-12-20 ENCOUNTER — Encounter: Payer: Self-pay | Admitting: Internal Medicine

## 2010-12-20 DIAGNOSIS — E119 Type 2 diabetes mellitus without complications: Secondary | ICD-10-CM

## 2010-12-20 DIAGNOSIS — I10 Essential (primary) hypertension: Secondary | ICD-10-CM

## 2010-12-20 DIAGNOSIS — H9190 Unspecified hearing loss, unspecified ear: Secondary | ICD-10-CM | POA: Insufficient documentation

## 2010-12-20 DIAGNOSIS — E0500 Thyrotoxicosis with diffuse goiter without thyrotoxic crisis or storm: Secondary | ICD-10-CM

## 2010-12-20 DIAGNOSIS — E785 Hyperlipidemia, unspecified: Secondary | ICD-10-CM

## 2010-12-20 NOTE — Assessment & Plan Note (Signed)
Continue with same medicines

## 2010-12-20 NOTE — Progress Notes (Signed)
  Subjective:    Patient ID: Calvin Williams, male    DOB: Dec 11, 1955, 55 y.o.   MRN: [id number]  HPI Routine office visit He was seen a few months ago with abdominal pain, symptoms resolved He was also seen w/  tinnitus, left ear.  Denies any pain or discharge from the ear . He also reports today that he has a chronic right hearing loss and  the left ear is slightly getting worse. As far as the diabetes, his ambulatory blood sugars are 133. Cholesterol, he was started on Lipitor, good compliance, no apparent side effects Hypertension, good medication compliance, blood pressures in the ambulatory setting is  around 130/80  Past Medical History  Diagnosis Date  . Diabetes mellitus 2009    adult onset  . Hypertension   . Glomerulonephritis 12/2008    bx provem membranous  . Hyperlipemia   . H/O: hematuria     saw urology 3/09, neg w/u  . Urinary retention 01/2009    was seen at the ER and followup by urology  . Grave's disease     reportedly, history of i nthe past u/s 6/11 slightly enlarged gland w/o nodules  . Decreased hearing    No past surgical history on file.   Review of Systems Denies chest pain or shortness of breath No nausea, vomiting, diarrhea     Objective:   Physical Exam  Constitutional: He is oriented to person, place, and time. He appears well-developed and well-nourished. No distress.  HENT:  Head: Normocephalic and atraumatic.  Right Ear: External ear normal.  Left Ear: External ear normal.       Throat is normal to inspection, no redness, discharge  Neck: Normal range of motion. Neck supple.  Cardiovascular: Normal rate, regular rhythm and normal heart sounds.   No murmur heard. Pulmonary/Chest: Effort normal. No respiratory distress. He has no wheezes. He has no rales.  Musculoskeletal: He exhibits no edema.  Lymphadenopathy:    He has no cervical adenopathy.  Neurological: He is alert and oriented to person, place, and time.  Skin: Skin is warm and  dry. He is not diaphoretic.  Psychiatric: He has a normal mood and affect. His behavior is normal. Thought content normal.          Assessment & Plan:

## 2010-12-20 NOTE — Assessment & Plan Note (Signed)
Chronic right hearing loss. Left hearing slightly decreased along with tinnitus. Plan: ENT referral

## 2010-12-20 NOTE — Assessment & Plan Note (Addendum)
Based on the last cholesterol , he was started on medication, good  compliance and tolerance. Labs, see instructions

## 2010-12-20 NOTE — Patient Instructions (Signed)
Came back fasting: A1C --microalb ---dx DM BMP---dx HTN FLP AST ALT---dx DM TSH---dx Graves Dz

## 2010-12-20 NOTE — Assessment & Plan Note (Addendum)
Due for labs

## 2010-12-23 NOTE — Assessment & Plan Note (Signed)
History of remote treatment, labs

## 2010-12-25 ENCOUNTER — Encounter: Payer: Self-pay | Admitting: Internal Medicine

## 2010-12-26 ENCOUNTER — Other Ambulatory Visit: Payer: Self-pay | Admitting: Internal Medicine

## 2010-12-26 DIAGNOSIS — E0500 Thyrotoxicosis with diffuse goiter without thyrotoxic crisis or storm: Secondary | ICD-10-CM

## 2010-12-26 DIAGNOSIS — E119 Type 2 diabetes mellitus without complications: Secondary | ICD-10-CM

## 2010-12-26 DIAGNOSIS — I10 Essential (primary) hypertension: Secondary | ICD-10-CM

## 2010-12-27 ENCOUNTER — Other Ambulatory Visit (INDEPENDENT_AMBULATORY_CARE_PROVIDER_SITE_OTHER): Payer: BC Managed Care – PPO

## 2010-12-27 DIAGNOSIS — E0500 Thyrotoxicosis with diffuse goiter without thyrotoxic crisis or storm: Secondary | ICD-10-CM

## 2010-12-27 DIAGNOSIS — I10 Essential (primary) hypertension: Secondary | ICD-10-CM

## 2010-12-27 DIAGNOSIS — E119 Type 2 diabetes mellitus without complications: Secondary | ICD-10-CM

## 2010-12-27 LAB — BASIC METABOLIC PANEL
BUN: 24 mg/dL — ABNORMAL HIGH (ref 6–23)
CO2: 26 mEq/L (ref 19–32)
Calcium: 8.8 mg/dL (ref 8.4–10.5)
Chloride: 109 mEq/L (ref 96–112)
Creatinine, Ser: 1.3 mg/dL (ref 0.4–1.5)
GFR: 59.04 mL/min — ABNORMAL LOW (ref >60.00)
Glucose, Bld: 116 mg/dL — ABNORMAL HIGH (ref 70–99)
Potassium: 3.8 mEq/L (ref 3.5–5.1)
Sodium: 142 mEq/L (ref 135–145)

## 2010-12-27 LAB — MICROALBUMIN / CREATININE URINE RATIO
Creatinine,U: 163.4 mg/dL
Microalb Creat Ratio: 23.9 mg/g (ref 0.0–30.0)
Microalb, Ur: 39.1 mg/dL — ABNORMAL HIGH (ref 0.0–1.9)

## 2010-12-27 LAB — LIPID PANEL
Cholesterol: 201 mg/dL — ABNORMAL HIGH (ref 0–200)
HDL: 48.7 mg/dL (ref >39.00)
Total CHOL/HDL Ratio: 4
Triglycerides: 112 mg/dL (ref 0.0–149.0)
VLDL: 22.4 mg/dL (ref 0.0–40.0)

## 2010-12-27 LAB — HEMOGLOBIN A1C: Hgb A1c MFr Bld: 6.3 % (ref 4.6–6.5)

## 2010-12-27 LAB — AST: AST: 20 U/L (ref 0–37)

## 2010-12-27 LAB — LDL CHOLESTEROL, DIRECT: Direct LDL: 118.7 mg/dL

## 2010-12-27 LAB — ALT: ALT: 25 U/L (ref 0–53)

## 2010-12-27 LAB — TSH: TSH: 2.41 u[IU]/mL (ref 0.35–5.50)

## 2010-12-27 NOTE — Progress Notes (Signed)
Labs only

## 2010-12-31 ENCOUNTER — Telehealth: Payer: Self-pay | Admitting: *Deleted

## 2010-12-31 NOTE — Telephone Encounter (Signed)
Message left for patient to return my call.  

## 2010-12-31 NOTE — Telephone Encounter (Signed)
Message copied by Leanne Lovely on Tue Dec 31, 2010 11:51 AM ------      Message from: Calvin Williams      Created: Mon Dec 30, 2010  5:14 PM       (creat slt elevated , recheck on RTC)      Advise patient:      Diabetes is stable      Thyroid test normal      Cholesterol has improved but needs more medication, change Lipitor from 20 to  40 mg

## 2011-01-01 NOTE — Telephone Encounter (Signed)
Pt aware of instructions will call when he needs refill

## 2011-01-01 NOTE — Telephone Encounter (Signed)
Message left for patient to return my call.  

## 2011-04-03 LAB — URINE MICROSCOPIC-ADD ON

## 2011-04-03 LAB — URINALYSIS, ROUTINE W REFLEX MICROSCOPIC
Bilirubin Urine: NEGATIVE
Bilirubin Urine: NEGATIVE
Glucose, UA: NEGATIVE
Glucose, UA: NEGATIVE
Ketones, ur: NEGATIVE
Ketones, ur: NEGATIVE
Leukocytes, UA: NEGATIVE
Leukocytes, UA: NEGATIVE
Nitrite: NEGATIVE
Nitrite: NEGATIVE
Protein, ur: 100 — AB
Protein, ur: 100 — AB
Specific Gravity, Urine: 1.019
Specific Gravity, Urine: 1.016
Urobilinogen, UA: 2 — ABNORMAL HIGH
Urobilinogen, UA: 2 — ABNORMAL HIGH
pH: 6.5
pH: 7

## 2011-04-03 LAB — CBC
HCT: 42.5
Hemoglobin: 15.2
MCHC: 35.7
MCV: 85.7
Platelets: 200
RBC: 4.96
RDW: 13.1
WBC: 8.9

## 2011-04-03 LAB — DIFFERENTIAL
Basophils Absolute: 0
Basophils Relative: 0
Eosinophils Absolute: 0.1
Eosinophils Relative: 1
Lymphocytes Relative: 15
Lymphs Abs: 1.4
Monocytes Absolute: 0.5
Monocytes Relative: 5
Neutro Abs: 7
Neutrophils Relative %: 78 — ABNORMAL HIGH

## 2011-04-03 LAB — POCT I-STAT, CHEM 8
BUN: 19
Calcium, Ion: 1.16
Chloride: 106
Creatinine, Ser: 1.3
Glucose, Bld: 158 — ABNORMAL HIGH
HCT: 42
Hemoglobin: 14.3
Potassium: 4
Sodium: 140
TCO2: 26

## 2011-04-18 ENCOUNTER — Encounter: Payer: Self-pay | Admitting: Internal Medicine

## 2011-04-18 ENCOUNTER — Ambulatory Visit (INDEPENDENT_AMBULATORY_CARE_PROVIDER_SITE_OTHER): Payer: 59 | Admitting: Internal Medicine

## 2011-04-18 DIAGNOSIS — Z136 Encounter for screening for cardiovascular disorders: Secondary | ICD-10-CM

## 2011-04-18 DIAGNOSIS — Z23 Encounter for immunization: Secondary | ICD-10-CM

## 2011-04-18 DIAGNOSIS — Z0000 Encounter for general adult medical examination without abnormal findings: Secondary | ICD-10-CM | POA: Insufficient documentation

## 2011-04-18 DIAGNOSIS — E119 Type 2 diabetes mellitus without complications: Secondary | ICD-10-CM

## 2011-04-18 DIAGNOSIS — N052 Unspecified nephritic syndrome with diffuse membranous glomerulonephritis: Secondary | ICD-10-CM

## 2011-04-18 DIAGNOSIS — H9190 Unspecified hearing loss, unspecified ear: Secondary | ICD-10-CM

## 2011-04-18 LAB — CBC WITH DIFFERENTIAL/PLATELET
Basophils Absolute: 0 10*3/uL (ref 0.0–0.1)
Basophils Relative: 0.6 % (ref 0.0–3.0)
Eosinophils Absolute: 0.1 10*3/uL (ref 0.0–0.7)
Eosinophils Relative: 1.1 % (ref 0.0–5.0)
HCT: 42.7 % (ref 39.0–52.0)
Hemoglobin: 14.8 g/dL (ref 13.0–17.0)
Lymphocytes Relative: 26.5 % (ref 12.0–46.0)
Lymphs Abs: 1.5 10*3/uL (ref 0.7–4.0)
MCHC: 34.6 g/dL (ref 30.0–36.0)
MCV: 87.1 fl (ref 78.0–100.0)
Monocytes Absolute: 0.4 10*3/uL (ref 0.1–1.0)
Monocytes Relative: 7.1 % (ref 3.0–12.0)
Neutro Abs: 3.5 10*3/uL (ref 1.4–7.7)
Neutrophils Relative %: 64.7 % (ref 43.0–77.0)
Platelets: 186 10*3/uL (ref 150.0–400.0)
RBC: 4.9 Mil/uL (ref 4.22–5.81)
RDW: 13.5 % (ref 11.5–14.6)
WBC: 5.5 10*3/uL (ref 4.5–10.5)

## 2011-04-18 LAB — POCT URINALYSIS DIPSTICK
Glucose, UA: NEGATIVE
Ketones, UA: NEGATIVE
Leukocytes, UA: NEGATIVE
Nitrite, UA: NEGATIVE
Spec Grav, UA: 1.025
Urobilinogen, UA: 0.2
pH, UA: 6

## 2011-04-18 LAB — PSA: PSA: 1.41 ng/mL (ref 0.10–4.00)

## 2011-04-18 LAB — LIPID PANEL
Cholesterol: 186 mg/dL (ref 0–200)
HDL: 50.5 mg/dL (ref >39.00)
LDL Cholesterol: 111 mg/dL — ABNORMAL HIGH (ref 0–99)
Total CHOL/HDL Ratio: 4
Triglycerides: 122 mg/dL (ref 0.0–149.0)
VLDL: 24.4 mg/dL (ref 0.0–40.0)

## 2011-04-18 LAB — BASIC METABOLIC PANEL
BUN: 17 mg/dL (ref 6–23)
CO2: 27 mEq/L (ref 19–32)
Calcium: 9.1 mg/dL (ref 8.4–10.5)
Chloride: 107 mEq/L (ref 96–112)
Creatinine, Ser: 1.2 mg/dL (ref 0.4–1.5)
GFR: 68.38 mL/min (ref >60.00)
Glucose, Bld: 114 mg/dL — ABNORMAL HIGH (ref 70–99)
Potassium: 4.3 mEq/L (ref 3.5–5.1)
Sodium: 141 mEq/L (ref 135–145)

## 2011-04-18 LAB — HEMOGLOBIN A1C: Hgb A1c MFr Bld: 6.3 % (ref 4.6–6.5)

## 2011-04-18 NOTE — Progress Notes (Signed)
  Subjective:    Patient ID: Calvin Williams, male    DOB: 1955/12/08, 55 y.o.   MRN: [id number]  HPI CPX Has a umbilical hernia, wonders if he has to do anything about it. Currently asymptomatic  Past Medical History  Diagnosis Date  . Diabetes mellitus 2009    adult onset  . Hypertension   . Glomerulonephritis 12/2008    bx provem membranous GN  . Hyperlipemia   . H/O: hematuria     saw urology 3/09, neg w/u  . Urinary retention 01/2009    was seen at the ER and followup by urology  . Grave's disease     reportedly, history of i nthe past u/s 6/11 slightly enlarged gland w/o nodules  . Decreased hearing    Past Surgical History  Procedure Date  . No past surgeries    History   Social History  . Marital Status: Married    Spouse Name: N/A    Number of Children: 3  . Years of Education: N/A   Occupational History  . welder    Social History Main Topics  . Smoking status: Former Smoker -- 1.0 packs/day    Quit date: 04/17/1989  . Smokeless tobacco: Never Used  . Alcohol Use: No  . Drug Use: No  . Sexually Active: Not on file   Other Topics Concern  . Not on file   Social History Narrative   Original from [place], moved to [place] in the 80s----Diet: healthy ---Exercise:active, walks daily    Family History  Problem Relation Age of Onset  . Diabetes      GM  . Heart attack Neg Hx   . Colon cancer Neg Hx   . Prostate cancer Neg Hx     Review of Systems  Respiratory: Negative for cough and shortness of breath.   Cardiovascular: Negative for chest pain and leg swelling.  Gastrointestinal: Negative for abdominal pain and blood in stool.  Genitourinary: Negative for dysuria, hematuria and difficulty urinating.       Objective:   Physical Exam  Constitutional: He is oriented to person, place, and time. He appears well-developed. No distress.  HENT:  Head: Normocephalic and atraumatic.  Neck: No thyromegaly present.  Cardiovascular: Normal rate, regular rhythm  and normal heart sounds.   No murmur heard. Pulmonary/Chest: Effort normal. No respiratory distress. He has no wheezes. He has no rales.  Abdominal: Soft. Bowel sounds are normal. He exhibits no distension. There is no tenderness. There is no rebound and no guarding.       1 inch, reducible, nontender, umbilical hernia.  Genitourinary: Rectum normal and prostate normal.  Musculoskeletal: He exhibits no edema.  Neurological: He is alert and oriented to person, place, and time.  Skin: He is not diaphoretic.  Psychiatric: He has a normal mood and affect. His behavior is normal. Judgment and thought content normal.          Assessment & Plan:

## 2011-04-18 NOTE — Assessment & Plan Note (Addendum)
Td 2011 Flu shot today Cscope 12-06 Dr Bosie Clos:  2 polyps , repeat in 5 years per report  --------------> refer to GI EKG today nsr Diet and exercise discussed Labs Has a umbilical hernia we discussed referral, will call if interested. Incarceration symptoms discussed

## 2011-04-18 NOTE — Assessment & Plan Note (Addendum)
Last visit with nephrology 4- 12, was stable, follow up yearly with them. Urine showed some blood, urine culture pending although doubt infection

## 2011-04-18 NOTE — Assessment & Plan Note (Addendum)
saw ENT 01-2011, was prescribed nasal steroids

## 2011-04-22 ENCOUNTER — Telehealth: Payer: Self-pay | Admitting: *Deleted

## 2011-04-22 LAB — CULTURE, URINE COMPREHENSIVE: Colony Count: 45000

## 2011-04-22 MED ORDER — CIPROFLOXACIN HCL 500 MG PO TABS: 500.0000 mg | ORAL_TABLET | Freq: Two times a day (BID) | ORAL | Status: AC

## 2011-04-22 MED ORDER — ATORVASTATIN CALCIUM 40 MG PO TABS: 80.0000 mg | ORAL_TABLET | Freq: Every day | ORAL | Status: DC

## 2011-04-22 NOTE — Telephone Encounter (Signed)
Message copied by Doristine Devoid on Tue Apr 22, 2011  3:36 PM ------      Message from: Willow Ora E      Created: Tue Apr 22, 2011  8:16 AM       Urine culture positive, he had some hematuria consequently will treat with antibiotics.      Advise patient:      --Diabetes well-controlled, kidney function stable      --Needs Cipro 500 mg twice a day for one week for a urine infection      --Cholesterol needs better control, increase Lipitor from 40 to 80, will need a new prescription.      --Next visit in 5 months as planned      Fax results to nephrology

## 2011-04-22 NOTE — Telephone Encounter (Signed)
Spoke w/ pt aware of labs and medication sent to pharmacy  

## 2011-06-20 ENCOUNTER — Other Ambulatory Visit: Payer: Self-pay | Admitting: Internal Medicine

## 2011-08-14 ENCOUNTER — Other Ambulatory Visit: Payer: Self-pay | Admitting: Gastroenterology

## 2011-09-19 ENCOUNTER — Ambulatory Visit: Payer: 59 | Admitting: Internal Medicine

## 2011-09-19 DIAGNOSIS — Z0289 Encounter for other administrative examinations: Secondary | ICD-10-CM

## 2011-10-17 ENCOUNTER — Other Ambulatory Visit: Payer: Self-pay | Admitting: Internal Medicine

## 2011-10-17 NOTE — Telephone Encounter (Signed)
Refill done.  

## 2012-11-24 ENCOUNTER — Other Ambulatory Visit: Payer: Self-pay | Admitting: Internal Medicine

## 2012-11-26 ENCOUNTER — Other Ambulatory Visit: Payer: Self-pay | Admitting: Internal Medicine

## 2012-11-26 ENCOUNTER — Ambulatory Visit (INDEPENDENT_AMBULATORY_CARE_PROVIDER_SITE_OTHER): Payer: 59 | Admitting: Internal Medicine

## 2012-11-26 ENCOUNTER — Encounter: Payer: Self-pay | Admitting: Internal Medicine

## 2012-11-26 VITALS — BP 142/90 | HR 63 | Temp 97.7°F | Ht 73.0 in | Wt 227.0 lb

## 2012-11-26 DIAGNOSIS — I10 Essential (primary) hypertension: Secondary | ICD-10-CM

## 2012-11-26 DIAGNOSIS — E785 Hyperlipidemia, unspecified: Secondary | ICD-10-CM

## 2012-11-26 DIAGNOSIS — M25561 Pain in right knee: Secondary | ICD-10-CM

## 2012-11-26 DIAGNOSIS — E119 Type 2 diabetes mellitus without complications: Secondary | ICD-10-CM

## 2012-11-26 DIAGNOSIS — M25569 Pain in unspecified knee: Secondary | ICD-10-CM | POA: Insufficient documentation

## 2012-11-26 DIAGNOSIS — N052 Unspecified nephritic syndrome with diffuse membranous glomerulonephritis: Secondary | ICD-10-CM

## 2012-11-26 DIAGNOSIS — Z0000 Encounter for general adult medical examination without abnormal findings: Secondary | ICD-10-CM

## 2012-11-26 LAB — LIPID PANEL
Cholesterol: 173 mg/dL (ref 0–200)
HDL: 45.3 mg/dL (ref >39.00)
LDL Cholesterol: 97 mg/dL (ref 0–99)
Total CHOL/HDL Ratio: 4
Triglycerides: 156 mg/dL — ABNORMAL HIGH (ref 0.0–149.0)
VLDL: 31.2 mg/dL (ref 0.0–40.0)

## 2012-11-26 LAB — POCT URINALYSIS DIPSTICK
Bilirubin, UA: NEGATIVE
Glucose, UA: 500
Ketones, UA: NEGATIVE
Leukocytes, UA: NEGATIVE
Nitrite, UA: NEGATIVE
Protein, UA: 100
Spec Grav, UA: 1.01
Urobilinogen, UA: 0.2
pH, UA: 6.5

## 2012-11-26 LAB — ALT: ALT: 31 U/L (ref 0–53)

## 2012-11-26 LAB — TSH: TSH: 0.77 u[IU]/mL (ref 0.35–5.50)

## 2012-11-26 LAB — PSA: PSA: 1.39 ng/mL (ref 0.10–4.00)

## 2012-11-26 LAB — AST: AST: 22 U/L (ref 0–37)

## 2012-11-26 LAB — HEMOGLOBIN A1C: Hgb A1c MFr Bld: 7.8 % — ABNORMAL HIGH (ref 4.6–6.5)

## 2012-11-26 MED ORDER — LISINOPRIL 5 MG PO TABS: ORAL_TABLET | ORAL | Status: DC

## 2012-11-26 NOTE — Assessment & Plan Note (Addendum)
BP at a nephrology office a month ago within normal. BP today slightly elevated but he reports ambulatory BPs in the 120s/80. Plan: No change. See instructions

## 2012-11-26 NOTE — Assessment & Plan Note (Signed)
Reports a injury ~ 2 years ago, saw a [facility] Physician, since then is not completely well, unable to exercise as much as he would like to. Plan: Refer to ortho, he knows  to avoid NSAIDS

## 2012-11-26 NOTE — Assessment & Plan Note (Addendum)
Is on the last cholesterol panel was recommended to increased Lipitor from 40-80 mg, he remains in 40 mg. Labs------refill whith results

## 2012-11-26 NOTE — Progress Notes (Signed)
  Subjective:    Patient ID: Calvin Williams, male    DOB: March 01, 1956, 57 y.o.   MRN: [id number]  HPI Complete physical exam, last visit more than a year ago, he was without medical insurance for a while.  Past Medical History  Diagnosis Date  . Diabetes mellitus 2009    adult onset  . Hypertension   . Glomerulonephritis 12/2008    bx provem membranous GN  . Hyperlipemia   . H/O: hematuria     saw urology 3/09, neg w/u  . Urinary retention 01/2009    was seen at the ER and followup by urology  . Grave's disease     reportedly, history of i nthe past u/s 6/11 slightly enlarged gland w/o nodules  . Decreased hearing     Left   Past Surgical History  Procedure Laterality Date  . No past surgeries     History   Social History  . Marital Status: Married    Spouse Name: N/A    Number of Children: 3  . Years of Education: N/A   Occupational History  . welder    Social History Main Topics  . Smoking status: Former Smoker -- 1.00 packs/day    Quit date: 04/17/1989  . Smokeless tobacco: Never Used  . Alcohol Use: No  . Drug Use: No  . Sexually Active: Not on file   Other Topics Concern  . Not on file   Social History Narrative   Original from [place], moved to [place] in the 80s , lives w/ wife   Family History  Problem Relation Age of Onset  . Diabetes Other     GM  . Heart attack Neg Hx   . Colon cancer Neg Hx   . Prostate cancer Neg Hx      Review of Systems Diet: regular to healthy Exercise limited by R knee pain Good compliance with Lipitor 40 mg and  lisinopril. No ambulatory blood sugars. Ambulatory BP in the 120s/80s. Has chronic right knee pain, see assessment and plan. Denies chest pain or shortness of breath. No nausea, vomiting, diarrhea. No blood in the stools. No dysuria or gross hematuria. No lower extremity edema unless he takes prolonged car trips.     Objective:   Physical Exam BP 142/90  Pulse 63  Temp(Src) 97.7 F (36.5 C) (Oral)  Ht 6'  1" (1.854 m)  Wt 227 lb (102.967 kg)  BMI 29.96 kg/m2  SpO2 98%  General -- alert, well-developed  .   Neck --no thyromegaly , normal carotid pulse Lungs -- normal respiratory effort, no intercostal retractions, no accessory muscle use, and normal breath sounds.   Heart-- normal rate, regular rhythm, no murmur, and no gallop.   Abdomen--soft, non-tender, no distention, no masses, no HSM, umbilical hernia unchanged from previous exam.  Extremities-- no pretibial edema bilaterally Rectal-- No external abnormalities noted. Normal sphincter tone. No rectal masses or tenderness. Brown stools Prostate:  Prostate gland firm and smooth, no enlargement, nodularity, tenderness, mass, asymmetry or induration. Neurologic-- alert & oriented X3 and strength normal in all extremities. Psych-- Cognition and judgment appear intact. Alert and cooperative with normal attention span and concentration.  not anxious appearing and not depressed appearing.       Assessment & Plan:

## 2012-11-26 NOTE — Patient Instructions (Addendum)
Check the  blood pressure 2 or 3 times a week, be sure it is between 110/60 and 140/85. If it is consistently higher or lower, let me know Next visit 4 months Tylenol  500 mg OTC 2 tabs a day every 8 hours as needed for pain NO motrin, advil, naproxen, ibuprofen or similar meds

## 2012-11-26 NOTE — Assessment & Plan Note (Addendum)
Td 2011 zostavax discussed  Cscope 12-06 Dr Bosie Clos:  2 polyps Cscope again 08-2011, +polyp--->  repeat in 5 years   Diet and exercise discussed Labs umbilical hernia unchanged

## 2012-11-26 NOTE — Assessment & Plan Note (Addendum)
Reports he saw nephrology a month ago, records not available at this time. Was told  blood work was fine .  Was diagnosed with a UTI, status post antibiotics. Urinalysis today shows some blood, will send a urine culture.  He has been completely  Asymptomatic in the last few months, will check a urine culture and treat with results. If he has persisting UTIs, may need urology eval.

## 2012-11-26 NOTE — Assessment & Plan Note (Signed)
Labs

## 2012-11-26 NOTE — Telephone Encounter (Signed)
Per dr. Drue Novel, waiting for the lab results before refilling this med.

## 2012-11-29 LAB — URINE CULTURE: Colony Count: >=100000

## 2012-12-01 ENCOUNTER — Telehealth: Payer: Self-pay | Admitting: *Deleted

## 2012-12-01 MED ORDER — SITAGLIPTIN PHOSPHATE 100 MG PO TABS: 100.0000 mg | ORAL_TABLET | Freq: Every day | ORAL | Status: DC

## 2012-12-01 NOTE — Telephone Encounter (Signed)
Refill done.  

## 2012-12-01 NOTE — Telephone Encounter (Signed)
Message copied by Nada Maclachlan on Wed Dec 01, 2012  4:47 PM ------      Message from: Willow Ora E      Created: Wed Dec 01, 2012  1:41 PM       Advise patient:      Cholesterol is well-controlled with Lipitor 40 mg one tablet daily. Call a refill.      Diabetes needs better control, start Januvia 100 mg one tablet daily, provide samples and call #30 and 3 refills.      Urine culture showed a bacteria, he is completely asymptomatic, likely a contaminant-----> Advise patient to call anytime if has fever, chills, difficulty urinating or burning.      Other labs are normal.      Next f/u in 3 months       ------

## 2012-12-01 NOTE — Telephone Encounter (Signed)
Discussed with pt.  rx sent to pharmacy & samples left at front desk.

## 2012-12-06 ENCOUNTER — Encounter: Payer: Self-pay | Admitting: Nephrology

## 2013-04-08 ENCOUNTER — Encounter: Payer: Self-pay | Admitting: Internal Medicine

## 2013-04-08 ENCOUNTER — Ambulatory Visit (INDEPENDENT_AMBULATORY_CARE_PROVIDER_SITE_OTHER): Payer: 59 | Admitting: Internal Medicine

## 2013-04-08 VITALS — BP 157/93 | HR 69 | Temp 98.6°F | Wt 226.0 lb

## 2013-04-08 DIAGNOSIS — E119 Type 2 diabetes mellitus without complications: Secondary | ICD-10-CM

## 2013-04-08 DIAGNOSIS — E785 Hyperlipidemia, unspecified: Secondary | ICD-10-CM

## 2013-04-08 DIAGNOSIS — I10 Essential (primary) hypertension: Secondary | ICD-10-CM

## 2013-04-08 DIAGNOSIS — Z23 Encounter for immunization: Secondary | ICD-10-CM

## 2013-04-08 MED ORDER — ATORVASTATIN CALCIUM 40 MG PO TABS: 40.0000 mg | ORAL_TABLET | Freq: Every day | ORAL | Status: DC

## 2013-04-08 MED ORDER — LISINOPRIL 10 MG PO TABS: 10.0000 mg | ORAL_TABLET | Freq: Every day | ORAL | Status: DC

## 2013-04-08 MED ORDER — SITAGLIPTIN PHOSPHATE 100 MG PO TABS: 100.0000 mg | ORAL_TABLET | Freq: Every day | ORAL | Status: DC

## 2013-04-08 NOTE — Assessment & Plan Note (Signed)
Was recommended Januvia based on the last A1c, he never started it, recommend to go ahead and take Januvia. New prescription sent, follow up in 2 months

## 2013-04-08 NOTE — Progress Notes (Signed)
  Subjective:    Patient ID: Calvin Williams, male    DOB: [birth year], 57 y.o.   MRN: [id number]  HPI Routine office visit Hypertension, good compliance with lisinopril, ambulatory BPs usually 130/80, sometimes "a little more". Diabetes--was prescribed Januvia but never started. No ambulatory CBGs. Labs from nephrology 10/2012 reviewed: Normal CBC, potassium 3.8, creatinine 1.2, LFTs normal. Needs a refill of Lipitor   Past Medical History  Diagnosis Date  . Diabetes mellitus 2009    adult onset  . Hypertension   . Glomerulonephritis 12/2008    bx provem membranous GN  . Hyperlipemia   . H/O: hematuria     saw urology 3/09, neg w/u  . Urinary retention 01/2009    was seen at the ER and followup by urology  . Grave's disease     reportedly, history of i nthe past u/s 6/11 slightly enlarged gland w/o nodules  . Decreased hearing     Left   Past Surgical History  Procedure Laterality Date  . No past surgeries     History   Social History  . Marital Status: Married    Spouse Name: N/A    Number of Children: 3  . Years of Education: N/A   Occupational History  . welder    Social History Main Topics  . Smoking status: Former Smoker -- 1.00 packs/day    Quit date: 04/17/1989  . Smokeless tobacco: Never Used  . Alcohol Use: No  . Drug Use: No  . Sexual Activity: Not on file   Other Topics Concern  . Not on file   Social History Narrative   Original from [place], moved to [place] in the 80s , lives w/ wife     Review of Systems History of a UTI, denies fever, chills, dysuria or gross hematuria.     Objective:   Physical Exam BP 157/93  Pulse 69  Temp(Src) 98.6 F (37 C)  Wt 226 lb (102.513 kg)  BMI 29.82 kg/m2  SpO2 99% General -- alert, well-developed, NAD.   Lungs -- normal respiratory effort, no intercostal retractions, no accessory muscle use, and normal breath sounds.  Heart-- normal rate, regular rhythm, no murmur.   Extremities-- no pretibial edema  bilaterally  Neurologic--  alert & oriented X3. Speech normal, gait normal, strength normal in all extremities.   Psych-- Cognition and judgment appear intact. Cooperative with normal attention span and concentration. No anxious appearing , no depressed appearing.     Assessment & Plan:

## 2013-04-08 NOTE — Assessment & Plan Note (Addendum)
Well-controlled? Recommend to increase lisinopril from 5 mg to 10 mg, check BPs in the ambulatory setting. Reassess in 2 months. Labs from nephrology 10/2012 reviewed: Normal CBC, potassium 3.8, creatinine 1.2, LFTs normal.

## 2013-04-08 NOTE — Assessment & Plan Note (Signed)
Last cholesterol panel showed good control on Lipitor 40 mg, a new prescription for 40 mg is sent . Labs from nephrology 10/2012 reviewed: Normal CBC, potassium 3.8, creatinine 1.2, LFTs normal.

## 2013-04-09 ENCOUNTER — Encounter: Payer: Self-pay | Admitting: Internal Medicine

## 2013-06-10 ENCOUNTER — Ambulatory Visit: Payer: 59 | Admitting: Internal Medicine

## 2013-06-24 ENCOUNTER — Encounter: Payer: Self-pay | Admitting: Internal Medicine

## 2013-06-24 ENCOUNTER — Ambulatory Visit (INDEPENDENT_AMBULATORY_CARE_PROVIDER_SITE_OTHER): Payer: 59 | Admitting: Internal Medicine

## 2013-06-24 VITALS — BP 129/86 | HR 78 | Temp 98.1°F | Wt 227.0 lb

## 2013-06-24 DIAGNOSIS — I10 Essential (primary) hypertension: Secondary | ICD-10-CM

## 2013-06-24 DIAGNOSIS — E119 Type 2 diabetes mellitus without complications: Secondary | ICD-10-CM

## 2013-06-24 LAB — BASIC METABOLIC PANEL
BUN: 22 mg/dL (ref 6–23)
CO2: 26 mEq/L (ref 19–32)
Calcium: 9 mg/dL (ref 8.4–10.5)
Chloride: 106 mEq/L (ref 96–112)
Creatinine, Ser: 1.2 mg/dL (ref 0.4–1.5)
GFR: 66.54 mL/min (ref >60.00)
Glucose, Bld: 160 mg/dL — ABNORMAL HIGH (ref 70–99)
Potassium: 3.8 mEq/L (ref 3.5–5.1)
Sodium: 138 mEq/L (ref 135–145)

## 2013-06-24 MED ORDER — GLIMEPIRIDE 2 MG PO TABS: 2.0000 mg | ORAL_TABLET | Freq: Every day | ORAL | Status: DC

## 2013-06-24 MED ORDER — LISINOPRIL 10 MG PO TABS: 10.0000 mg | ORAL_TABLET | Freq: Every day | ORAL | Status: DC

## 2013-06-24 MED ORDER — ATORVASTATIN CALCIUM 40 MG PO TABS: 40.0000 mg | ORAL_TABLET | Freq: Every day | ORAL | Status: DC

## 2013-06-24 NOTE — Progress Notes (Signed)
   Subjective:    Patient ID: Calvin Williams, male    DOB: 10-31-1955, 57 y.o.   MRN: [id number]  HPI Followup visit Hypertension, lisinopril dose was increased from 5 mg to 10 mg, ambulatory BPs now in the 120, 130. Diabetes, was unable to start Januvia d/t cost  Past Medical History  Diagnosis Date  . Diabetes mellitus 2009    adult onset  . Hypertension   . Glomerulonephritis 12/2008    bx provem membranous GN  . Hyperlipemia   . H/O: hematuria     saw urology 3/09, neg w/u  . Urinary retention 01/2009    was seen at the ER and followup by urology  . Grave's disease     reportedly, history of i nthe past u/s 6/11 slightly enlarged gland w/o nodules  . Decreased hearing     Left   Past Surgical History  Procedure Laterality Date  . No past surgeries      Review of Systems Diet has improved, still able to exercise much. Denies chest pain or difficulty breathing.     Objective:   Physical Exam  BP 129/86  Pulse 78  Temp(Src) 98.1 F (36.7 C)  Wt 227 lb (102.967 kg)  SpO2 99% General -- alert, well-developed, NAD.  Lungs -- normal respiratory effort, no intercostal retractions, no accessory muscle use, and normal breath sounds.  Heart-- normal rate, regular rhythm, no murmur.   Psych-- Cognition and judgment appear intact. Cooperative with normal attention span and concentration. No anxious appearing , no depressed appearing.     Assessment & Plan:

## 2013-06-24 NOTE — Assessment & Plan Note (Signed)
Since the last time he was here, was unable to afford Januvia. I'm somehow hesitant to prescribe metformin d/t h/o CRI Plan: Glimepiride 2 mg, low blood sugar symptoms discussed. CBG goals discussed RTC 3 months

## 2013-06-24 NOTE — Assessment & Plan Note (Signed)
Seems to be better controlled with lisinopril 10 mg, check a BMP

## 2013-06-24 NOTE — Patient Instructions (Addendum)
Get your blood work before you leave  Next visit for a physical exam follow up   regards diabetes hypertension , no fasting, in 3 months  Please make an appointment     tomese el azucar 5-6 veces a la semana, llame si sale mas de 200 o si se le baja a menos de 90      Hipoglucemia (bajo nivel de glucosa en sangre) (Hypoglycemia [Low Blood Sugar]) La hipoglucemia ocurre cuando la glucosa (azcar) en su sangre es demasiado baja. La hipoglucemia puede suceder por muchas razones. La hipoglucemia puede ocurrir [employer] con o sin diabetes. La hipoglucemia puede comenzar sbitamente y ser una emergencia mdica.  CAUSAS Hipoglucemia no significa que tiene diabetes. Las causas ms frecuentes son:  Retardar o Actor comidas o no comer la suficiente cantidad de carbohidratos.  Sobredosis de medicamentos. Esto podra ser por accidente o deliberadamente. Si por accidente, necesita cambiar o ajustar su medicacin.  Aumento de ejercicios o actividad sin ajustar los carbohidratos o medicamentos.  Un trastorno nervioso que afecta algunas funciones corporales como ritmo cardaco, presin sangunea y digestin (neuropata [place]).  Una enfermedad en la que los msculos del estmago no funcionan adecuadamente (gastroparesis). Por lo tanto, los medicamentos podran no absorberse de [place].  Incapacidad para reconocer las seales de hipoglucemia (inconsciencia hipoglucmica).  Absorcin de Immunologist.  El consumo de alcohol.  Embarazo/ciclos menstruales/postparto. Puede deberse al consumo de hormonas.  Ciertos tipos de tumores. Esto es [employer]. SNTOMAS  Sudoracin.  Hambre.  Mareos.  Visin borrosa.  Somnolencia.  Debilitamiento.  Dolor de [place].  Ritmo cardaco rpido.  Temblores.  Nervios. DIAGNSTICO El diagnstico se realiza mediante el control de la glucosa en sangre de una o varias de las siguientes maneras:  Control de glucosa  en sangre con tiras de prueba.  Resultados de laboratorio. TRATAMIENTO Si cree que su nivel de glucosa en sangre es bajo:  Controle su nivel de glucosa, si es posible. Si tiene menos de 70mg /dl, tome una de las siguientes:  3-4 tabletas de glucosa.   taza de jugo (preferiblemente claro, Berline Lopes).   taza de refresco normal.  1 taza de leche   - 1 tubo de glucosa en gel.  5 6 caramelos duros.  No se sobreestimule por que la glucosa en sangre (azcar) se elevar demasiado.  Espere 15 minutos y vuelva a controlarse la glucosa. Si tiene menos de 70mg /dl, (o por debajo de su rango normal), repita el tratamiento.  Ingiera una colacin si falta ms de una hora hasta su prxima comida. A veces, la glucosa en sangre podr ser tan baja que ser incapaz de tratarse usted mismo. Podr ser necesario que alguien le ayude. Podra incluso desmayarse o ser incapaz de tragar. Esto podr requerir una inyeccin de glucagn, que eleva la glucosa en sangre. INSTRUCCIONES PARA EL CUIDADO DOMICILIARIO  Controle la glucosa en sangre segn le haya indicado el mdico.  Utilice los medicamentos tal como se le indic.  Siga el plan de alimentacin. No saltee comidas. Coma siempre a la misma hora.  Si va a beber alcohol, hgalo slo con las comidas.  Controle su nivel de glucosa antes de conducir.  Controle su nivel de glucosa antes de hacer ejercicio. Si va a hacer ejercicio por ms tiempo o de [employer] a la usual, controle la glucosa con ms frecuencia.  Siempre lleve el tratamiento con usted. Las tabletas de glucosa son las ms fciles de Midwife.  Tenga siempre  una pulsera de alerta mdica o alguna identificacin que indique que es diabtico. Esto le indicar a las personas que usted tiene diabetes. Si tiene hipoglucemia, tendrn Estate agent idea Public relations account executive. SOLICITE ATENCIN MDICA SI:  Tiene problemas para Futures trader de glucosa en el rango indicado.  Tiene episodios  frecuentes de hipoglucemia.  Siente efectos adversos por los medicamentos prescriptos.  Tiene sntomas de enfermedad que no mejoran en 3  4 das.  Nota cambios o un nuevo problema en la visin . SOLICITE ATENCIN MDICA DE INMEDIATO SI:  Usted es un familiar o amigo de la persona cuya glucosa en sangre est por debajo de 70mg /dl y est acompaada de:  Confusin.  Cambio en el estado mental.  Incapacidad para tragar.  Se desmaya. Document Released: 06/23/2005 Document Revised: 09/15/2011 [facility] Patient Information 2014 [place], [place].

## 2013-06-24 NOTE — Progress Notes (Signed)
Pre visit review using our clinic review tool, if applicable. No additional management support is needed unless otherwise documented below in the visit note. 

## 2013-06-27 ENCOUNTER — Encounter: Payer: Self-pay | Admitting: *Deleted

## 2013-07-26 ENCOUNTER — Telehealth: Payer: Self-pay | Admitting: *Deleted

## 2013-07-26 ENCOUNTER — Other Ambulatory Visit: Payer: Self-pay | Admitting: *Deleted

## 2013-07-26 MED ORDER — GLUCOSE BLOOD VI STRP: ORAL_STRIP | Status: DC

## 2013-07-26 NOTE — Telephone Encounter (Signed)
Patient came in and requested a refill for glucose blood (FREESTYLE TEST STRIPS) test strip   Pharmacy [facility] [address]- [place], [place] - [address]

## 2013-07-26 NOTE — Telephone Encounter (Signed)
Patient came in today and stated that his glucose meter was broken. He was given a new meter, Freestyle [employer]. Strips were ordered and sent to patient's pharmacy. JG//CMA

## 2013-09-23 ENCOUNTER — Ambulatory Visit: Payer: 59 | Admitting: Internal Medicine

## 2013-09-30 ENCOUNTER — Encounter: Payer: Self-pay | Admitting: Internal Medicine

## 2013-09-30 ENCOUNTER — Ambulatory Visit (INDEPENDENT_AMBULATORY_CARE_PROVIDER_SITE_OTHER): Payer: 59 | Admitting: Internal Medicine

## 2013-09-30 VITALS — BP 149/98 | HR 75 | Temp 98.0°F | Wt 232.0 lb

## 2013-09-30 DIAGNOSIS — E119 Type 2 diabetes mellitus without complications: Secondary | ICD-10-CM

## 2013-09-30 DIAGNOSIS — I10 Essential (primary) hypertension: Secondary | ICD-10-CM

## 2013-09-30 MED ORDER — SAXAGLIPTIN HCL 5 MG PO TABS: 5.0000 mg | ORAL_TABLET | Freq: Every day | ORAL | Status: DC

## 2013-09-30 MED ORDER — LOSARTAN POTASSIUM 50 MG PO TABS: 50.0000 mg | ORAL_TABLET | Freq: Every day | ORAL | Status: DC

## 2013-09-30 NOTE — Assessment & Plan Note (Addendum)
Well-controlled? Plan:  Start lisinopril 10, start losartan 50, BMP in 2 weeks

## 2013-09-30 NOTE — Patient Instructions (Signed)
Schedule labs 2 weeks from now, No need to be fasting: BMP--- hypertension A1c ---diabetes  Schedule a complete physical 3 months from now     deje el lisinopril empieze losartan ---- glimeperide tome solamente 1/2 tableta empieze onglyza  ---- llame si la presion esta mas de 150 llame si el azucar se le baja a menos de 90

## 2013-09-30 NOTE — Assessment & Plan Note (Addendum)
With consistent use of  glimepiride 2 mg, CBGs   sometimes in the 60s. Plan: Decrease glimeperide 1 mg daily Start onglyza (cupon) Will still consider metformin in the future if needed as long as creat remains ok Also recommend eye exam  Feet exam today normal

## 2013-09-30 NOTE — Progress Notes (Signed)
Pre visit review using our clinic review tool, if applicable. No additional management support is needed unless otherwise documented below in the visit note. 

## 2013-09-30 NOTE — Progress Notes (Signed)
Subjective:    Patient ID: Calvin Williams, male    DOB: 08/18/1955, 58 y.o.   MRN: [id number]  DOS:  09/30/2013  Type of  visit: Routine office visit Hypertension, BP today is elevated at 149/98, when he checks at the pharmacy is also sometimes is elevated at 130, 140s  Diabetes, w/ daily amaryly CBGs drops to  50s - 60s , skipping doses to prevent hypoglycemia   ROS No visual disturbances. No lower extremity paresthesias. No nausea, vomiting, diarrhea.   Past Medical History  Diagnosis Date  . Diabetes mellitus 2009    adult onset  . Hypertension   . Glomerulonephritis 12/2008    bx provem membranous GN  . Hyperlipemia   . H/O: hematuria     saw urology 3/09, neg w/u  . Urinary retention 01/2009    was seen at the ER and followup by urology  . Grave's disease     reportedly, history of i nthe past u/s 6/11 slightly enlarged gland w/o nodules  . Decreased hearing     Left    Past Surgical History  Procedure Laterality Date  . No past surgeries      History   Social History  . Marital Status: Married    Spouse Name: N/A    Number of Children: 3  . Years of Education: N/A   Occupational History  . welder    Social History Main Topics  . Smoking status: Former Smoker -- 1.00 packs/day    Quit date: 04/17/1989  . Smokeless tobacco: Never Used  . Alcohol Use: No  . Drug Use: No  . Sexual Activity: Not on file   Other Topics Concern  . Not on file   Social History Narrative   Original from [place], moved to [place] in the 80s , lives w/ wife        Medication List       This list is accurate as of: 09/30/13  5:01 PM.  Always use your most recent med list.               aspirin 81 MG tablet  Take 81 mg by mouth daily.     atorvastatin 40 MG tablet  Commonly known as:  LIPITOR  Take 1 tablet (40 mg total) by mouth daily.     freestyle lancets  1 each by Other route 2 (two) times daily. Use as instructed     glimepiride 2 MG tablet  Commonly known  as:  AMARYL  Take 1 mg by mouth daily before breakfast.     glucose blood test strip  Commonly known as:  FREESTYLE INSULINX TEST  Test blood sugar once daily. Dx code: 250.00     losartan 50 MG tablet  Commonly known as:  COZAAR  Take 1 tablet (50 mg total) by mouth daily.     saxagliptin HCl 5 MG Tabs tablet  Commonly known as:  ONGLYZA  Take 1 tablet (5 mg total) by mouth daily.           Objective:   Physical Exam BP 149/98  Pulse 75  Temp(Src) 98 F (36.7 C)  Wt 232 lb (105.235 kg)  SpO2 99% General -- alert, well-developed, NAD.  DIABETIC FEET EXAM: No lower extremity edema Normal pedal pulses bilaterally Skin normal, nails dystrophic, no calluses Pinprick examination of the feet normal. Neurologic--  alert & oriented X3. Speech normal, gait normal, strength normal in all extremities.  Psych-- Cognition and judgment appear intact.  Cooperative with normal attention span and concentration. No anxious or depressed appearing.         Assessment & Plan:

## 2013-10-01 ENCOUNTER — Telehealth: Payer: Self-pay | Admitting: Internal Medicine

## 2013-10-01 NOTE — Telephone Encounter (Signed)
Relevant patient education mailed to patient.  

## 2013-10-14 ENCOUNTER — Other Ambulatory Visit (INDEPENDENT_AMBULATORY_CARE_PROVIDER_SITE_OTHER): Payer: 59

## 2013-10-14 DIAGNOSIS — I10 Essential (primary) hypertension: Secondary | ICD-10-CM

## 2013-10-14 DIAGNOSIS — E119 Type 2 diabetes mellitus without complications: Secondary | ICD-10-CM

## 2013-10-14 LAB — BASIC METABOLIC PANEL
BUN: 19 mg/dL (ref 6–23)
CO2: 26 mEq/L (ref 19–32)
Calcium: 9.2 mg/dL (ref 8.4–10.5)
Chloride: 104 mEq/L (ref 96–112)
Creatinine, Ser: 1.2 mg/dL (ref 0.4–1.5)
GFR: 66.47 mL/min (ref >60.00)
Glucose, Bld: 81 mg/dL (ref 70–99)
Potassium: 3.5 mEq/L (ref 3.5–5.1)
Sodium: 138 mEq/L (ref 135–145)

## 2013-10-14 LAB — HEMOGLOBIN A1C: Hgb A1c MFr Bld: 6.8 % — ABNORMAL HIGH (ref 4.6–6.5)

## 2013-10-17 ENCOUNTER — Encounter: Payer: Self-pay | Admitting: *Deleted

## 2013-10-27 ENCOUNTER — Telehealth: Payer: Self-pay

## 2013-10-27 NOTE — Telephone Encounter (Signed)
Relevant patient education mailed to patient.  

## 2013-12-29 ENCOUNTER — Telehealth: Payer: Self-pay | Admitting: *Deleted

## 2013-12-29 DIAGNOSIS — E785 Hyperlipidemia, unspecified: Secondary | ICD-10-CM

## 2013-12-29 NOTE — Telephone Encounter (Signed)
Spoke with patient who advised he was at work and was unable to review chart.

## 2013-12-29 NOTE — Telephone Encounter (Signed)
Medication List and allergies:     [facility] [place]  Immunizations Due:   A/P FH, PSH, or Personal Hx: Flu vaccine: 04/2013 Tdap: Td 11/2009 PNA: Shingles: CCS:  08/2011 Polyp/Internal Hemorrhoids-->5 years PSA: 11/2012 1.39  To Discuss with Provider:

## 2013-12-30 ENCOUNTER — Encounter: Payer: Self-pay | Admitting: Internal Medicine

## 2013-12-30 ENCOUNTER — Ambulatory Visit (INDEPENDENT_AMBULATORY_CARE_PROVIDER_SITE_OTHER): Payer: 59 | Admitting: Internal Medicine

## 2013-12-30 VITALS — BP 161/101 | HR 73 | Temp 98.0°F | Ht 73.0 in | Wt 235.0 lb

## 2013-12-30 DIAGNOSIS — I10 Essential (primary) hypertension: Secondary | ICD-10-CM

## 2013-12-30 DIAGNOSIS — Z0000 Encounter for general adult medical examination without abnormal findings: Secondary | ICD-10-CM

## 2013-12-30 DIAGNOSIS — E119 Type 2 diabetes mellitus without complications: Secondary | ICD-10-CM

## 2013-12-30 LAB — LIPID PANEL
Cholesterol: 229 mg/dL — ABNORMAL HIGH (ref 0–200)
HDL: 45.1 mg/dL (ref >39.00)
LDL Cholesterol: 137 mg/dL — ABNORMAL HIGH (ref 0–99)
NonHDL: 183.9
Total CHOL/HDL Ratio: 5
Triglycerides: 233 mg/dL — ABNORMAL HIGH (ref 0.0–149.0)
VLDL: 46.6 mg/dL — ABNORMAL HIGH (ref 0.0–40.0)

## 2013-12-30 LAB — PSA: PSA: 1.47 ng/mL (ref 0.10–4.00)

## 2013-12-30 LAB — CBC WITH DIFFERENTIAL/PLATELET
Basophils Absolute: 0 10*3/uL (ref 0.0–0.1)
Basophils Relative: 0.4 % (ref 0.0–3.0)
Eosinophils Absolute: 0.1 10*3/uL (ref 0.0–0.7)
Eosinophils Relative: 1 % (ref 0.0–5.0)
HCT: 44.8 % (ref 39.0–52.0)
Hemoglobin: 15.5 g/dL (ref 13.0–17.0)
Lymphocytes Relative: 22.9 % (ref 12.0–46.0)
Lymphs Abs: 1.4 10*3/uL (ref 0.7–4.0)
MCHC: 34.6 g/dL (ref 30.0–36.0)
MCV: 84.9 fl (ref 78.0–100.0)
Monocytes Absolute: 0.4 10*3/uL (ref 0.1–1.0)
Monocytes Relative: 6.9 % (ref 3.0–12.0)
Neutro Abs: 4.3 10*3/uL (ref 1.4–7.7)
Neutrophils Relative %: 68.8 % (ref 43.0–77.0)
Platelets: 205 10*3/uL (ref 150.0–400.0)
RBC: 5.28 Mil/uL (ref 4.22–5.81)
RDW: 13.5 % (ref 11.5–15.5)
WBC: 6.2 10*3/uL (ref 4.0–10.5)

## 2013-12-30 LAB — TSH: TSH: 2.08 u[IU]/mL (ref 0.35–4.50)

## 2013-12-30 LAB — AST: AST: 24 U/L (ref 0–37)

## 2013-12-30 LAB — ALT: ALT: 31 U/L (ref 0–53)

## 2013-12-30 MED ORDER — LOSARTAN POTASSIUM 100 MG PO TABS: 100.0000 mg | ORAL_TABLET | Freq: Every day | ORAL | Status: DC

## 2013-12-30 NOTE — Assessment & Plan Note (Signed)
At last OV was switched to  losartan 50 mg, ran out of medication 2 days ago, BP today elevated. When he was taking the medication BP was 130, 140. Plan: Increase losartan to 100 mg, BMP in one month, continue monitoring BPs

## 2013-12-30 NOTE — Patient Instructions (Signed)
Get your blood work before you leave   Increase losartan  Check the  blood pressure 2 times a week be sure it is between 110/60 and 140/85. Ideal blood pressure is 120/80. If it is consistently higher or lower, let me know  Necome back in 1 month for labs: BMP ----- dx HTN A1C ----- dx DM   Next visit is for routine check up regards your blood sugar , blood pressure   in 4-5 months  No need to come back fasting Please make an appointment

## 2013-12-30 NOTE — Assessment & Plan Note (Signed)
Doing very well with new medication regimen, check the A1c in one month

## 2013-12-30 NOTE — Progress Notes (Signed)
Pre visit review using our clinic review tool, if applicable. No additional management support is needed unless otherwise documented below in the visit note. 

## 2013-12-30 NOTE — Progress Notes (Signed)
Subjective:    Patient ID: Calvin LimboJorge Williams, male    DOB: 27-Aug-1955, 58 y.o.   MRN: [id number]  DOS:  12/30/2013 Type of  Visit: CPX History: feeling well  .   ROS Diet-- healthy most of the time  Exercise-- better, able to do more (had knee pain) No  CP, SOB No palpitations, no lower extremity edema Denies  nausea, vomiting diarrhea, blood in the stools (-) cough, sputum production (-) wheezing, chest congestion No dysuria, gross hematuria, difficulty urinating  No anxiety, depression    Past Medical History  Diagnosis Date  . Diabetes mellitus 2009    adult onset  . Hypertension   . Glomerulonephritis 12/2008    bx provem membranous GN, sees renal q year  . Hyperlipemia   . H/O: hematuria     saw urology 3/09, neg w/u  . Urinary retention 01/2009    was seen at the ER and followup by urology  . Grave's disease     reportedly, history of i nthe past u/s 6/11 slightly enlarged gland w/o nodules  . Decreased hearing     Left    Past Surgical History  Procedure Laterality Date  . No past surgeries      History   Social History  . Marital Status: Married    Spouse Name: N/A    Number of Children: 3  . Years of Education: N/A   Occupational History  . welder    Social History Main Topics  . Smoking status: Former Smoker -- 1.00 packs/day    Quit date: 04/17/1989  . Smokeless tobacco: Never Used  . Alcohol Use: No  . Drug Use: No  . Sexual Activity: Not on file   Other Topics Concern  . Not on file   Social History Narrative   Original from [place], moved to [place] in the 80s , lives w/ wife     Family History  Problem Relation Age of Onset  . Diabetes Other     GM  . Heart attack Neg Hx   . Colon cancer Neg Hx   . Prostate cancer Neg Hx        Medication List       This list is accurate as of: 12/30/13 11:59 PM.  Always use your most recent med list.               aspirin 81 MG tablet  Take 81 mg by mouth daily.     atorvastatin 40 MG  tablet  Commonly known as:  LIPITOR  Take 1 tablet (40 mg total) by mouth daily.     freestyle lancets  1 each by Other route 2 (two) times daily. Use as instructed     glimepiride 2 MG tablet  Commonly known as:  AMARYL  Take 1 mg by mouth daily before breakfast.     glucose blood test strip  Commonly known as:  FREESTYLE INSULINX TEST  Test blood sugar once daily. Dx code: 250.00     losartan 100 MG tablet  Commonly known as:  COZAAR  Take 1 tablet (100 mg total) by mouth daily.     saxagliptin HCl 5 MG Tabs tablet  Commonly known as:  ONGLYZA  Take 1 tablet (5 mg total) by mouth daily.           Objective:   Physical Exam BP 161/101  Pulse 73  Temp(Src) 98 F (36.7 C)  Ht 6\' 1"  (1.854 m)  Wt 235 lb (  106.595 kg)  BMI 31.01 kg/m2  SpO2 99%  General -- alert, well-developed, NAD.  Neck --no thyromegaly  HEENT-- Not pale. Lungs -- normal respiratory effort, no intercostal retractions, no accessory muscle use, and normal breath sounds.  Heart-- normal rate, regular rhythm, no murmur.  Abdomen-- Not distended, good bowel sounds,soft, non-tender.  Rectal-- No external abnormalities noted. Normal sphincter tone. No rectal masses or tenderness. No stool found  Prostate--Prostate gland firm and smooth, no enlargement, nodularity, tenderness, mass, asymmetry or induration. Extremities-- no pretibial edema bilaterally , + varicose veins B w/o phlebitis  Neurologic--  alert & oriented X3. Speech normal, gait appropriate for age, strength symmetric and appropriate for age.  Psych-- Cognition and judgment appear intact. Cooperative with normal attention span and concentration. No anxious or depressed appearing.         Assessment & Plan:

## 2013-12-30 NOTE — Assessment & Plan Note (Signed)
Td 2011 zostavax discussed before   Cscope 12-06 Dr Bosie ClosSchooler:  2 polyps Cscope again 08-2011, +polyp--->  repeat in 5 years   Diet and exercise discussed Labs

## 2014-01-04 NOTE — Addendum Note (Signed)
Addended by: Eustace QuailEABOLD, Sola Margolis J on: 01/04/2014 09:12 AM   Modules accepted: Orders

## 2014-01-13 ENCOUNTER — Telehealth: Payer: Self-pay | Admitting: *Deleted

## 2014-01-13 DIAGNOSIS — E785 Hyperlipidemia, unspecified: Secondary | ICD-10-CM

## 2014-01-13 NOTE — Telephone Encounter (Signed)
FYI will need a FLP in 1 months in addition to a BMP and A1C

## 2014-01-13 NOTE — Telephone Encounter (Signed)
Labs ordered.

## 2014-01-30 ENCOUNTER — Other Ambulatory Visit: Payer: Self-pay | Admitting: Internal Medicine

## 2014-02-03 ENCOUNTER — Other Ambulatory Visit (INDEPENDENT_AMBULATORY_CARE_PROVIDER_SITE_OTHER): Payer: 59

## 2014-02-03 DIAGNOSIS — E785 Hyperlipidemia, unspecified: Secondary | ICD-10-CM

## 2014-02-03 LAB — LIPID PANEL
Cholesterol: 208 mg/dL — ABNORMAL HIGH (ref 0–200)
HDL: 40.9 mg/dL (ref >39.00)
LDL Cholesterol: 128 mg/dL — ABNORMAL HIGH (ref 0–99)
NonHDL: 167.1
Total CHOL/HDL Ratio: 5
Triglycerides: 196 mg/dL — ABNORMAL HIGH (ref 0.0–149.0)
VLDL: 39.2 mg/dL (ref 0.0–40.0)

## 2014-02-03 LAB — BASIC METABOLIC PANEL
BUN: 20 mg/dL (ref 6–23)
CO2: 28 mEq/L (ref 19–32)
Calcium: 9.1 mg/dL (ref 8.4–10.5)
Chloride: 105 mEq/L (ref 96–112)
Creatinine, Ser: 1.2 mg/dL (ref 0.4–1.5)
GFR: 65.76 mL/min (ref >60.00)
Glucose, Bld: 135 mg/dL — ABNORMAL HIGH (ref 70–99)
Potassium: 4 mEq/L (ref 3.5–5.1)
Sodium: 139 mEq/L (ref 135–145)

## 2014-02-03 LAB — HEMOGLOBIN A1C: Hgb A1c MFr Bld: 6.6 % — ABNORMAL HIGH (ref 4.6–6.5)

## 2014-02-09 ENCOUNTER — Telehealth: Payer: Self-pay

## 2014-02-09 MED ORDER — ATORVASTATIN CALCIUM 40 MG PO TABS: ORAL_TABLET | ORAL | Status: DC

## 2014-02-09 NOTE — Telephone Encounter (Signed)
New Rx sent to Pharmacy per orders.

## 2014-02-09 NOTE — Telephone Encounter (Signed)
Message copied by Wende MottFOSTER, [place] Silveria H on Thu Feb 09, 2014  9:20 AM ------      Message from: Willow OraPAZ, JOSE E      Created: Wed Feb 08, 2014  5:11 PM      Regarding: Send a new prescription  , We are increasing the Lipitor dose       New prescription: Lipitor 40 mg 2 tablets daily #180 and 3 refills ------

## 2014-04-27 ENCOUNTER — Other Ambulatory Visit: Payer: Self-pay

## 2014-05-03 ENCOUNTER — Other Ambulatory Visit: Payer: Self-pay

## 2014-05-03 MED ORDER — SAXAGLIPTIN HCL 5 MG PO TABS: 5.0000 mg | ORAL_TABLET | Freq: Every day | ORAL | Status: DC

## 2014-05-05 ENCOUNTER — Ambulatory Visit: Payer: 59 | Admitting: Internal Medicine

## 2014-05-26 ENCOUNTER — Ambulatory Visit (INDEPENDENT_AMBULATORY_CARE_PROVIDER_SITE_OTHER): Payer: 59 | Admitting: Internal Medicine

## 2014-05-26 ENCOUNTER — Ambulatory Visit (INDEPENDENT_AMBULATORY_CARE_PROVIDER_SITE_OTHER): Payer: 59

## 2014-05-26 ENCOUNTER — Encounter: Payer: Self-pay | Admitting: Internal Medicine

## 2014-05-26 VITALS — BP 164/102 | HR 67 | Temp 98.2°F | Wt 233.4 lb

## 2014-05-26 DIAGNOSIS — Z23 Encounter for immunization: Secondary | ICD-10-CM

## 2014-05-26 DIAGNOSIS — E119 Type 2 diabetes mellitus without complications: Secondary | ICD-10-CM

## 2014-05-26 DIAGNOSIS — I10 Essential (primary) hypertension: Secondary | ICD-10-CM

## 2014-05-26 DIAGNOSIS — E785 Hyperlipidemia, unspecified: Secondary | ICD-10-CM

## 2014-05-26 MED ORDER — ATORVASTATIN CALCIUM 80 MG PO TABS: 80.0000 mg | ORAL_TABLET | Freq: Every day | ORAL | Status: DC

## 2014-05-26 MED ORDER — FREESTYLE LANCETS MISC: Status: DC

## 2014-05-26 MED ORDER — GLIMEPIRIDE 2 MG PO TABS: ORAL_TABLET | ORAL | Status: DC

## 2014-05-26 MED ORDER — LOSARTAN POTASSIUM 100 MG PO TABS: 100.0000 mg | ORAL_TABLET | Freq: Every day | ORAL | Status: DC

## 2014-05-26 NOTE — Patient Instructions (Signed)
Stop by the front desk and schedule labs to be done by the end of December (fasting)  Please come back to the office in 4 months  for a routine check up  No  fasting

## 2014-05-26 NOTE — Progress Notes (Signed)
Pre visit review using our clinic review tool, if applicable. No additional management support is needed unless otherwise documented below in the visit note. 

## 2014-05-26 NOTE — Progress Notes (Signed)
Subjective:    Patient ID: Calvin LimboJorge Lochridge, male    DOB: 1955/07/16, 58 y.o.   MRN: [id number]  DOS:  05/26/2014 Type of visit - description : rov Interval history: High cholesterol, was recommended to increase Lipitor to 80 mg but the pharmacy would not dispense 2 tablets, taking only 40 mg Diabetes, good compliance of medications, ambulatory blood sugars around 135 hypertension, not well controlled, has not been taking losartan, he simply got confused. BP today elevated.   ROS Denies chest pain or difficulty breathing No nausea, vomiting, diarrhea.   Past Medical History  Diagnosis Date  . Diabetes mellitus 2009    adult onset  . Hypertension   . Glomerulonephritis 12/2008    bx provem membranous GN, sees renal q year  . Hyperlipemia   . H/O: hematuria     saw urology 3/09, neg w/u  . Urinary retention 01/2009    was seen at the ER and followup by urology  . Grave's disease     reportedly, history of i nthe past u/s 6/11 slightly enlarged gland w/o nodules  . Decreased hearing     Left    Past Surgical History  Procedure Laterality Date  . No past surgeries      History   Social History  . Marital Status: Married    Spouse Name: N/A    Number of Children: 3  . Years of Education: N/A   Occupational History  . welder    Social History Main Topics  . Smoking status: Former Smoker -- 1.00 packs/day    Quit date: 04/17/1989  . Smokeless tobacco: Never Used  . Alcohol Use: No  . Drug Use: No  . Sexual Activity: Not on file   Other Topics Concern  . Not on file   Social History Narrative   Original from [place], moved to [place] in the 80s , lives w/ wife        Medication List       This list is accurate as of: 05/26/14 11:59 PM.  Always use your most recent med list.               aspirin 81 MG tablet  Take 81 mg by mouth daily.     atorvastatin 80 MG tablet  Commonly known as:  LIPITOR  Take 1 tablet (80 mg total) by mouth daily.     freestyle  lancets  Check 2 times daily.     glimepiride 2 MG tablet  Commonly known as:  AMARYL  1/2 tablet a day     glucose blood test strip  Commonly known as:  FREESTYLE INSULINX TEST  Test blood sugar once daily. Dx code: 250.00     losartan 100 MG tablet  Commonly known as:  COZAAR  Take 1 tablet (100 mg total) by mouth daily.     saxagliptin HCl 5 MG Tabs tablet  Commonly known as:  ONGLYZA  Take 1 tablet (5 mg total) by mouth daily.           Objective:   Physical Exam BP 164/102 mmHg  Pulse 67  Temp(Src) 98.2 F (36.8 C) (Oral)  Wt 233 lb 6 oz (105.858 kg)  SpO2 99%  General -- alert, well-developed, NAD.   Lungs -- normal respiratory effort, no intercostal retractions, no accessory muscle use, and normal breath sounds.  Heart-- normal rate, regular rhythm, no murmur.   Extremities-- no pretibial edema bilaterally  Neurologic--  alert & oriented X3. Speech  normal, gait appropriate for age, strength symmetric and appropriate for age.  Psych-- Cognition and judgment appear intact. Cooperative with normal attention span and concentration. No anxious or depressed appearing.       Assessment & Plan:    Next visit 3 months Flu shot today

## 2014-05-27 NOTE — Assessment & Plan Note (Signed)
High cholesterol goal, we increased Lipitor 40 mg to 2 tablets a day by the pharmacy would not dispense that amount, still taking one tablet daily Plan: Start Lipitor 80 mg one tablet daily. Labs in one month

## 2014-05-27 NOTE — Assessment & Plan Note (Signed)
Hypertension, not taking losartan, simply got confused. BP today elevated, ambulatory BPs 139 on average. Plan: Restart losartan, prescription sent, continue monitoring BPs, labs in one month. Will check a BMP

## 2014-05-27 NOTE — Assessment & Plan Note (Signed)
Diabetes, good compliance with medications, last A1c satisfactory

## 2014-06-09 ENCOUNTER — Ambulatory Visit (INDEPENDENT_AMBULATORY_CARE_PROVIDER_SITE_OTHER): Payer: 59 | Admitting: Medical

## 2014-06-09 ENCOUNTER — Encounter: Payer: Self-pay | Admitting: Medical

## 2014-06-09 VITALS — BP 150/90 | HR 68 | Temp 98.5°F | Ht 72.5 in | Wt 230.6 lb

## 2014-06-09 DIAGNOSIS — J3089 Other allergic rhinitis: Secondary | ICD-10-CM

## 2014-06-09 DIAGNOSIS — H6500 Acute serous otitis media, unspecified ear: Secondary | ICD-10-CM

## 2014-06-09 MED ORDER — BENZONATATE 100 MG PO CAPS: 100.0000 mg | ORAL_CAPSULE | Freq: Three times a day (TID) | ORAL | Status: DC | PRN

## 2014-06-09 MED ORDER — AZITHROMYCIN 250 MG PO TABS: ORAL_TABLET | ORAL | Status: DC

## 2014-06-09 MED ORDER — FLUTICASONE PROPIONATE 50 MCG/ACT NA SUSP: 2.0000 | Freq: Every day | NASAL | Status: DC

## 2014-06-09 NOTE — Patient Instructions (Addendum)
You appear to have allergic rhinitis with some subsequent related RT OM and possible early lt om.  I am prescribing flonase nasal spray, benzonatate for cough and azithromycin antibiotic.  Follow up in 7-10 days or as needed.  Pt did not take his blood pressure medicine for 2 days. I have advised him to restart. Bp high today appears as result not taking his medication.

## 2014-06-09 NOTE — Assessment & Plan Note (Signed)
Azithromycin antibiotic. 

## 2014-06-09 NOTE — Progress Notes (Signed)
Pre visit review using our clinic review tool, if applicable. No additional management support is needed unless otherwise documented below in the visit note. 

## 2014-06-09 NOTE — Progress Notes (Signed)
Subjective:    Patient ID: Janine LimboJorge Seelinger, male    DOB: [birth year], 58 y.o.   MRN: [id number]  HPI  Pt in today reporting  Cough(occasional productive., mild sore throat and rt ear pian.Pain for last 4 days. Cough is keeping him up at night. No wheezing. No hx of smoking. No asthma history.   Associated symptoms( below yes or no)  Fever- yes. subjective Chills-yes  Chest congestion-no Sneezing- yes Itching eyes-yes Sore throat- yes. Faint sore throat. Post-nasal drainage-yes Wheezing-no Purulent  Nasal drainage-no Fatigue-yes.  Past Medical History  Diagnosis Date  . Diabetes mellitus 2009    adult onset  . Hypertension   . Glomerulonephritis 12/2008    bx provem membranous GN, sees renal q year  . Hyperlipemia   . H/O: hematuria     saw urology 3/09, neg w/u  . Urinary retention 01/2009    was seen at the ER and followup by urology  . Grave's disease     reportedly, history of i nthe past u/s 6/11 slightly enlarged gland w/o nodules  . Decreased hearing     Left    History   Social History  . Marital Status: Married    Spouse Name: N/A    Number of Children: 3  . Years of Education: N/A   Occupational History  . welder    Social History Main Topics  . Smoking status: Former Smoker -- 1.00 packs/day    Quit date: 04/17/1989  . Smokeless tobacco: Never Used  . Alcohol Use: No  . Drug Use: No  . Sexual Activity: Not on file   Other Topics Concern  . Not on file   Social History Narrative   Original from [place], moved to [place] in the 80s , lives w/ wife    Past Surgical History  Procedure Laterality Date  . No past surgeries      Family History  Problem Relation Age of Onset  . Diabetes Other     GM  . Heart attack Neg Hx   . Colon cancer Neg Hx   . Prostate cancer Neg Hx     Allergies  Allergen Reactions  . Penicillins     Current Outpatient Prescriptions on File Prior to Visit  Medication Sig Dispense Refill  . aspirin 81 MG tablet Take  81 mg by mouth daily.      Marland Kitchen. atorvastatin (LIPITOR) 80 MG tablet Take 1 tablet (80 mg total) by mouth daily. 30 tablet 12  . glimepiride (AMARYL) 2 MG tablet 1/2 tablet a day 30 tablet 12  . glucose blood (FREESTYLE INSULINX TEST) test strip Test blood sugar once daily. Dx code: 250.00 100 each 12  . Lancets (FREESTYLE) lancets Check 2 times daily. 100 each 3  . losartan (COZAAR) 100 MG tablet Take 1 tablet (100 mg total) by mouth daily. 30 tablet 12  . saxagliptin HCl (ONGLYZA) 5 MG TABS tablet Take 1 tablet (5 mg total) by mouth daily. 30 tablet 4   No current facility-administered medications on file prior to visit.    BP 150/90 mmHg  Pulse 68  Temp(Src) 98.5 F (36.9 C) (Oral)  Ht 6' 0.5" (1.842 m)  Wt 230 lb 9.6 oz (104.599 kg)  BMI 30.83 kg/m2  SpO2 97%       Review of Systems  Constitutional: Positive for fever, chills and fatigue.  HENT: Positive for ear pain, postnasal drip, sneezing and sore throat. Negative for sinus pressure and trouble swallowing.   Eyes:  Positive for itching.  Respiratory: Positive for cough. Negative for wheezing.   Cardiovascular: Negative for chest pain and palpitations.  Neurological: Negative for dizziness and headaches.  Hematological: Negative for adenopathy. Does not bruise/bleed easily.       Objective:   Physical Exam  General- no acute distress, Pleasant pt.  Neck- full Range of motion, no nucal rigidity.    HEENT- Head- Normocephalic. Eyes- PEERL bilaterally, Ears- Rt canal clear but rt TM red and moderate budge( . Lt canal clear and tm faint dull. No mastoid tenderness either side. Nose- No frontal or maxillary sinus tenderness. Mild inflamed turbinated and boggy. Throat- mild  tonsillar hypertrophy and mild red, No exudate. pnd  Lungs- Clear even, and unlabored.  Heart- Regular, rate and rhythm.  Neurologic- CN III-XII grossly intact.        Assessment & Plan:

## 2014-06-09 NOTE — Assessment & Plan Note (Signed)
flonase rx

## 2014-06-23 ENCOUNTER — Other Ambulatory Visit (INDEPENDENT_AMBULATORY_CARE_PROVIDER_SITE_OTHER): Payer: 59

## 2014-06-23 DIAGNOSIS — I10 Essential (primary) hypertension: Secondary | ICD-10-CM

## 2014-06-23 DIAGNOSIS — E785 Hyperlipidemia, unspecified: Secondary | ICD-10-CM

## 2014-06-23 LAB — COMPREHENSIVE METABOLIC PANEL
ALT: 30 U/L (ref 0–53)
AST: 28 U/L (ref 0–37)
Albumin: 3.8 g/dL (ref 3.5–5.2)
Alkaline Phosphatase: 101 U/L (ref 39–117)
BUN: 16 mg/dL (ref 6–23)
CO2: 25 mEq/L (ref 19–32)
Calcium: 8.7 mg/dL (ref 8.4–10.5)
Chloride: 104 mEq/L (ref 96–112)
Creatinine, Ser: 1.2 mg/dL (ref 0.4–1.5)
GFR: 66.32 mL/min (ref >60.00)
Glucose, Bld: 124 mg/dL — ABNORMAL HIGH (ref 70–99)
Potassium: 3.9 mEq/L (ref 3.5–5.1)
Sodium: 136 mEq/L (ref 135–145)
Total Bilirubin: 1.4 mg/dL — ABNORMAL HIGH (ref 0.2–1.2)
Total Protein: 7.1 g/dL (ref 6.0–8.3)

## 2014-06-23 LAB — LIPID PANEL
Cholesterol: 170 mg/dL (ref 0–200)
HDL: 39.4 mg/dL (ref >39.00)
LDL Cholesterol: 103 mg/dL — ABNORMAL HIGH (ref 0–99)
NonHDL: 130.6
Total CHOL/HDL Ratio: 4
Triglycerides: 140 mg/dL (ref 0.0–149.0)
VLDL: 28 mg/dL (ref 0.0–40.0)

## 2014-09-22 ENCOUNTER — Ambulatory Visit (INDEPENDENT_AMBULATORY_CARE_PROVIDER_SITE_OTHER): Payer: 59 | Admitting: Internal Medicine

## 2014-09-22 ENCOUNTER — Encounter: Payer: Self-pay | Admitting: Internal Medicine

## 2014-09-22 VITALS — BP 138/92 | HR 78 | Temp 98.0°F | Ht 73.0 in | Wt 233.0 lb

## 2014-09-22 DIAGNOSIS — I10 Essential (primary) hypertension: Secondary | ICD-10-CM

## 2014-09-22 DIAGNOSIS — E119 Type 2 diabetes mellitus without complications: Secondary | ICD-10-CM

## 2014-09-22 DIAGNOSIS — Z23 Encounter for immunization: Secondary | ICD-10-CM

## 2014-09-22 LAB — HEMOGLOBIN A1C: Hgb A1c MFr Bld: 6.5 % (ref 4.6–6.5)

## 2014-09-22 LAB — HM DIABETES FOOT EXAM: HM Diabetic Foot Exam: NORMAL

## 2014-09-22 NOTE — Progress Notes (Signed)
Subjective:    Patient ID: Calvin Williams, male    DOB: 31-Mar-1956, 59 y.o.   MRN: [id number]  DOS:  09/22/2014 Type of visit - description : rov Interval history: In general feeling well, good medication compliance. No apparent side effects. Taking Onglyza and Amaryl correctly. Ambulatory BPs 124, 130. Ambulatory CBGs around 120. One time was  96.    Review of Systems  Very active, goes to the gym 4 times a week. Denies chest pain, difficulty breathing. No nausea, vomiting, diarrhea  Past Medical History  Diagnosis Date  . Diabetes mellitus 2009    adult onset  . Hypertension   . Glomerulonephritis 12/2008    bx provem membranous GN, sees renal q year  . Hyperlipemia   . H/O: hematuria     saw urology 3/09, neg w/u  . Urinary retention 01/2009    was seen at the ER and followup by urology  . Grave's disease     reportedly, history of i nthe past u/s 6/11 slightly enlarged gland w/o nodules  . Decreased hearing     Left    Past Surgical History  Procedure Laterality Date  . No past surgeries      History   Social History  . Marital Status: Married    Spouse Name: N/A  . Number of Children: 3  . Years of Education: N/A   Occupational History  . welder    Social History Main Topics  . Smoking status: Former Smoker -- 1.00 packs/day    Quit date: 04/17/1989  . Smokeless tobacco: Never Used  . Alcohol Use: No  . Drug Use: No  . Sexual Activity: Not on file   Other Topics Concern  . Not on file   Social History Narrative   Original from [place], moved to [place] in the 80s , lives w/ wife        Medication List       This list is accurate as of: 09/22/14 10:43 AM.  Always use your most recent med list.               aspirin 81 MG tablet  Take 81 mg by mouth daily.     atorvastatin 80 MG tablet  Commonly known as:  LIPITOR  Take 1 tablet (80 mg total) by mouth daily.     azithromycin 250 MG tablet  Commonly known as:  ZITHROMAX  Take 2 tablets  by mouth on day 1, followed by 1 tablet by mouth daily for 4 days.     benzonatate 100 MG capsule  Commonly known as:  TESSALON  Take 1 capsule (100 mg total) by mouth 3 (three) times daily as needed for cough.     fluticasone 50 MCG/ACT nasal spray  Commonly known as:  FLONASE  Place 2 sprays into both nostrils daily.     freestyle lancets  Check 2 times daily.     glimepiride 2 MG tablet  Commonly known as:  AMARYL  1/2 tablet a day     glucose blood test strip  Commonly known as:  FREESTYLE INSULINX TEST  Test blood sugar once daily. Dx code: 250.00     losartan 100 MG tablet  Commonly known as:  COZAAR  Take 1 tablet (100 mg total) by mouth daily.     saxagliptin HCl 5 MG Tabs tablet  Commonly known as:  ONGLYZA  Take 1 tablet (5 mg total) by mouth daily.  Objective:   Physical Exam BP 138/92 mmHg  Pulse 78  Temp(Src) 98 F (36.7 C) (Oral)  Ht 6\' 1"  (1.854 m)  Wt 233 lb (105.688 kg)  BMI 30.75 kg/m2  SpO2 99%  General:   Well developed, well nourished . NAD.  HEENT:  Normocephalic . Face symmetric, atraumatic Lungs:  CTA B Normal respiratory effort, no intercostal retractions, no accessory muscle use. Heart: RRR,  no murmur.  Muscle skeletal: no pretibial edema bilaterally  Skin: Not pale. Not jaundice Neurologic:  alert & oriented X3.  Speech normal, gait appropriate for age and unassisted Psych--  Cognition and judgment appear intact.  Cooperative with normal attention span and concentration.  Behavior appropriate. No anxious or depressed appearing.       Assessment & Plan:    Pneumonia shot today

## 2014-09-22 NOTE — Patient Instructions (Signed)
Get your blood work before you leave   Come back to the office in 3-4 months   for a physical exam  Please schedule an appointment at the front desk

## 2014-09-22 NOTE — Assessment & Plan Note (Signed)
Back on losartan, ambulatory BPs great, no change, last BMP satisfactory

## 2014-09-22 NOTE — Assessment & Plan Note (Addendum)
Good compliance with medication, he remains very active, going to the gym 4 times a day. Due for an eye exam, recommend to schedule a visit. Check A1c

## 2014-09-22 NOTE — Progress Notes (Signed)
Pre visit review using our clinic review tool, if applicable. No additional management support is needed unless otherwise documented below in the visit note. 

## 2014-11-10 LAB — BASIC METABOLIC PANEL
BUN: 18 mg/dL (ref 4–21)
Creatinine: 1.2 mg/dL (ref <=1.3)
Glucose: 120 mg/dL
Potassium: 3.9 mmol/L (ref 3.4–5.3)
Sodium: 138 mmol/L (ref 137–147)

## 2014-11-10 LAB — CBC AND DIFFERENTIAL
HCT: 44 % (ref 41–53)
Hemoglobin: 15.9 g/dL (ref 13.5–17.5)
Neutrophils Absolute: 4 /uL
Platelets: 168 10*3/uL (ref 150–399)
WBC: 5.8 10^3/mL

## 2015-01-09 ENCOUNTER — Other Ambulatory Visit: Payer: Self-pay

## 2015-01-09 MED ORDER — SAXAGLIPTIN HCL 5 MG PO TABS
5.0000 mg | ORAL_TABLET | Freq: Every day | ORAL | Status: DC
Start: 2015-01-09 — End: 2015-04-24

## 2015-02-22 ENCOUNTER — Telehealth: Payer: Self-pay | Admitting: Internal Medicine

## 2015-02-22 NOTE — Telephone Encounter (Signed)
pre visit letter mailed 02/09/15 °

## 2015-03-02 ENCOUNTER — Ambulatory Visit (INDEPENDENT_AMBULATORY_CARE_PROVIDER_SITE_OTHER): Payer: 59 | Admitting: Internal Medicine

## 2015-03-02 ENCOUNTER — Encounter: Payer: Self-pay | Admitting: Internal Medicine

## 2015-03-02 VITALS — BP 128/76 | HR 69 | Temp 97.8°F | Ht 73.0 in | Wt 228.2 lb

## 2015-03-02 DIAGNOSIS — Z114 Encounter for screening for human immunodeficiency virus [HIV]: Secondary | ICD-10-CM

## 2015-03-02 DIAGNOSIS — Z0000 Encounter for general adult medical examination without abnormal findings: Secondary | ICD-10-CM | POA: Diagnosis not present

## 2015-03-02 DIAGNOSIS — E119 Type 2 diabetes mellitus without complications: Secondary | ICD-10-CM | POA: Diagnosis not present

## 2015-03-02 DIAGNOSIS — Z1159 Encounter for screening for other viral diseases: Secondary | ICD-10-CM

## 2015-03-02 LAB — TSH: TSH: 2.32 u[IU]/mL (ref 0.35–4.50)

## 2015-03-02 LAB — HEMOGLOBIN A1C: Hgb A1c MFr Bld: 6.2 % (ref 4.6–6.5)

## 2015-03-02 LAB — HEPATITIS C ANTIBODY: HCV Ab: NEGATIVE

## 2015-03-02 LAB — AST: AST: 18 U/L (ref 0–37)

## 2015-03-02 LAB — HIV ANTIBODY (ROUTINE TESTING W REFLEX): HIV 1&2 Ab, 4th Generation: NONREACTIVE

## 2015-03-02 LAB — ALT: ALT: 21 U/L (ref 0–53)

## 2015-03-02 MED ORDER — KETOCONAZOLE 2 % EX CREA: 1.0000 "application " | TOPICAL_CREAM | Freq: Every day | CUTANEOUS | Status: DC

## 2015-03-02 NOTE — Patient Instructions (Signed)
Get your blood work before you leave   Apply the cream every night   for 2 weeks  Next visit 5 months, fasting for a routine checkup

## 2015-03-02 NOTE — Progress Notes (Signed)
Pre visit review using our clinic review tool, if applicable. No additional management support is needed unless otherwise documented below in the visit note. 

## 2015-03-02 NOTE — Progress Notes (Signed)
Subjective:    Patient ID: Calvin Nester Sr., male    DOB: 26-Feb-1956, 59 y.o.   MRN: [id number]  DOS:  03/02/2015 Type of visit - description : cpx Interval history: Compliance with medication except for losartan, she felt confused and has been taking half tablet daily for the last 2 weeks but plans to go back on one tablet daily    Review of Systems Constitutional: No fever. No chills. No unexplained wt changes. No unusual sweats  HEENT: No dental problems, no ear discharge, no facial swelling, no voice changes. No eye discharge, no eye  redness , no  intolerance to light   Respiratory: No wheezing , no  difficulty breathing. No cough , no mucus production  Cardiovascular: No CP, no leg swelling , no  Palpitations  GI: no nausea, no vomiting, no diarrhea , no  abdominal pain.  No blood in the stools. No dysphagia, no odynophagia    Endocrine: No polyphagia, no polyuria , no polydipsia  GU: No dysuria, gross hematuria, difficulty urinating. No urinary urgency, no frequency.  Musculoskeletal: No joint swellings or unusual aches or pains  Skin: Having slightly hyperpigmentation of the groin, occasionally the area itches  Allergic, immunologic: No environmental allergies , no  food allergies  Neurological: No dizziness no  syncope. No headaches. No diplopia, no slurred, no slurred speech, no motor deficits, no facial  Numbness  Hematological: No enlarged lymph nodes, no easy bruising , no unusual bleedings  Psychiatry: No suicidal ideas, no hallucinations, no beavior problems, no confusion.  No unusual/severe anxiety, no depression   Past Medical History  Diagnosis Date  . Diabetes mellitus 2009    adult onset  . Hypertension   . Glomerulonephritis 12/2008    bx provem membranous GN, sees renal q year  . Hyperlipemia   . H/O: hematuria     saw urology 3/09, neg w/u  . Urinary retention 01/2009    was seen at the ER and followup by urology  . Grave's disease    reportedly, history of i nthe past u/s 6/11 slightly enlarged gland w/o nodules  . Decreased hearing     Left  . Chronic kidney disease     Dr. [name]  . Secondary hyperparathyroidism, renal     Dr. [name]    Past Surgical History  Procedure Laterality Date  . No past surgeries      Social History   Social History  . Marital Status: Married    Spouse Name: N/A  . Number of Children: 3  . Years of Education: N/A   Occupational History  . welder    Social History Main Topics  . Smoking status: Former Smoker -- 1.00 packs/day    Quit date: 04/17/1989  . Smokeless tobacco: Never Used  . Alcohol Use: No  . Drug Use: No  . Sexual Activity: Not on file   Other Topics Concern  . Not on file   Social History Narrative   Original from [place], moved to [place] in the 80s , lives w/ wife        Medication List       This list is accurate as of: 03/02/15  9:29 AM.  Always use your most recent med list.               aspirin 81 MG tablet  Take 81 mg by mouth daily.     atorvastatin 80 MG tablet  Commonly known as:  LIPITOR  Take 1 tablet (  80 mg total) by mouth daily.     fluticasone 50 MCG/ACT nasal spray  Commonly known as:  FLONASE  Place 2 sprays into both nostrils daily.     freestyle lancets  Check 2 times daily.     glimepiride 2 MG tablet  Commonly known as:  AMARYL  1/2 tablet a day     glucose blood test strip  Commonly known as:  FREESTYLE INSULINX TEST  Test blood sugar once daily. Dx code: 250.00     losartan 100 MG tablet  Commonly known as:  COZAAR  Take 1 tablet (100 mg total) by mouth daily.     saxagliptin HCl 5 MG Tabs tablet  Commonly known as:  ONGLYZA  Take 1 tablet (5 mg total) by mouth daily.           Objective:   Physical Exam  Skin:      BP 128/76 mmHg  Pulse 69  Temp(Src) 97.8 F (36.6 C) (Oral)  Ht 6\' 1"  (1.854 m)  Wt 228 lb 4 oz (103.534 kg)  BMI 30.12 kg/m2  SpO2 97% General:   Well developed, well nourished .  NAD.  HEENT:  Normocephalic . Face symmetric, atraumatic Neck: No thyromegaly Lungs:  CTA B Normal respiratory effort, no intercostal retractions, no accessory muscle use. Heart: RRR,  no murmur.  no pretibial edema bilaterally  Abdomen:  Not distended, soft, non-tender. No rebound or rigidity. No mass,organomegaly   Neurologic:  alert & oriented X3.  Speech normal, gait appropriate for age and unassisted Psych--  Cognition and judgment appear intact.  Cooperative with normal attention span and concentration.  Behavior appropriate. No anxious or depressed appearing.    Assessment & Plan:   Rash: Fungal? Trial with ketoconazole  Hypertension: Controlled, the patient got confused and was taking half losartan for the last 2 weeks but plans to go back to his normal dose  Diabetes: Check A1c. Recommend to see the eye doctor  High cholesterol: Currently on Lipitor 80 mg, last LDL very good. Check LFTs.

## 2015-03-02 NOTE — Assessment & Plan Note (Addendum)
Td 2011 pnm shot 09-2014 prevnar next year  zostavax discussed before   Cscope 12-06 Dr Bosie Clos:  2 polyps Cscope again 08-2011, +polyp--->  repeat in 5 years   Prostate cancer screening: DRE and  PSA normal 12-2013. Reassess next year Diet and exercise discussed

## 2015-04-03 ENCOUNTER — Ambulatory Visit (INDEPENDENT_AMBULATORY_CARE_PROVIDER_SITE_OTHER): Payer: 59

## 2015-04-03 ENCOUNTER — Telehealth: Payer: Self-pay

## 2015-04-03 ENCOUNTER — Ambulatory Visit (INDEPENDENT_AMBULATORY_CARE_PROVIDER_SITE_OTHER): Payer: 59 | Admitting: Physician Assistant

## 2015-04-03 VITALS — BP 132/98 | HR 68 | Temp 98.4°F | Resp 16 | Ht 73.0 in | Wt 231.0 lb

## 2015-04-03 DIAGNOSIS — M6248 Contracture of muscle, other site: Secondary | ICD-10-CM

## 2015-04-03 DIAGNOSIS — M542 Cervicalgia: Secondary | ICD-10-CM

## 2015-04-03 DIAGNOSIS — M62838 Other muscle spasm: Secondary | ICD-10-CM

## 2015-04-03 DIAGNOSIS — Z23 Encounter for immunization: Secondary | ICD-10-CM | POA: Diagnosis not present

## 2015-04-03 MED ORDER — TRAMADOL HCL 50 MG PO TABS: 50.0000 mg | ORAL_TABLET | Freq: Three times a day (TID) | ORAL | Status: DC | PRN

## 2015-04-03 MED ORDER — CYCLOBENZAPRINE HCL ER 15 MG PO CP24: 15.0000 mg | ORAL_CAPSULE | Freq: Every day | ORAL | Status: DC | PRN

## 2015-04-03 MED ORDER — IBUPROFEN 600 MG PO TABS: 600.0000 mg | ORAL_TABLET | Freq: Three times a day (TID) | ORAL | Status: DC | PRN

## 2015-04-03 NOTE — Telephone Encounter (Signed)
PATIENT STATES HE SAW TISHIRA BREWINGTON TODAY FOR MUSCLE SPASMS IN HIS NECK. SHE PRESCRIBED HIM AMREX. THE PHARMACIST SAID HIS [facility] WILL NOT COVER THIS MEDICINE. HE WOULD LIKE HER TO GIVE HIM SOMETHING ELSE GENERIC. BEST PHONE [phone] (CELL)  PHARMACY CHOICE IS [facility] ON [address].  MBC

## 2015-04-03 NOTE — Progress Notes (Addendum)
   Subjective:    Patient ID: Calvin Biel Sr., male    DOB: 06/04/1956, 59 y.o.   MRN: [id number]  HPI Patient presents for neck pain that has been present for the past week. Pain does not radiate, but is worse with looking up, down, and to the left. Pain is constant, but did not bother him much until this morning when became more intense. Denies injury or trauma. No recent URI. Works as a Psychologist, occupational and looks down a lot, but denies repetitive movements. Denies numbness, tingling, loss of ROM/sensation/fxn. Have not tried any medication for pain. Med allergy: PCN.   Review of Systems  Constitutional: Negative.   Musculoskeletal: Positive for neck pain. Negative for myalgias, back pain, joint swelling, arthralgias and neck stiffness.  Skin: Negative.   Neurological: Negative for dizziness and headaches.       Objective:   Physical Exam  Constitutional: He is oriented to person, place, and time. He appears well-developed and well-nourished. No distress.  Blood pressure 132/98, pulse 68, temperature 98.4 F (36.9 C), temperature source Oral, resp. rate 16, height  (1.854 m), weight 231 lb (104.781 kg), SpO2 96 %.  HENT:  Head: Normocephalic and atraumatic.  Right Ear: External ear normal.  Left Ear: External ear normal.  Eyes: Conjunctivae are normal. Right eye exhibits no discharge. Left eye exhibits no discharge. No scleral icterus.  Neck: Normal range of motion and full passive range of motion without pain. Neck supple. Muscular tenderness present. No spinous process tenderness present. No rigidity. No edema, no erythema and normal range of motion present. No thyromegaly present.  Pulmonary/Chest: Effort normal.  Lymphadenopathy:    He has no cervical adenopathy.  Neurological: He is alert and oriented to person, place, and time.  Skin: Skin is warm and dry. No rash noted. He is not diaphoretic. No erythema. No pallor.  Psychiatric: He has a normal mood and affect. His behavior is  normal. Judgment and thought content normal.   [facility] reading (PRIMARY) by  Dr. Clelia Croft. Spondilolithesis and mild degenerative changes.    Assessment & Plan:  1. Muscle spasms of neck 2. Neck pain Alternate heating pad and ice. ROM exercises discussed.  - DG Cervical Spine 2 or 3 views; Future - ibuprofen (ADVIL,MOTRIN) 600 MG tablet; Take 1 tablet (600 mg total) by mouth every 8 (eight) hours as needed.  Dispense: 30 tablet; Refill: 0 - cyclobenzaprine (AMRIX) 15 MG 24 hr capsule; Take 1 capsule (15 mg total) by mouth daily as needed for muscle spasms.  Dispense: 15 capsule; Refill: 0 - traMADol (ULTRAM) 50 MG tablet; Take 1 tablet (50 mg total) by mouth every 8 (eight) hours as needed.  Dispense: 30 tablet; Refill: 0   3. Need for prophylactic vaccination and inoculation against influenza - Flu Vaccine QUAD 36+ mos IM   Tishira Brewington PA-C  [facility] 04/03/2015 11:05 AM

## 2015-04-04 MED ORDER — CYCLOBENZAPRINE HCL 10 MG PO TABS: 5.0000 mg | ORAL_TABLET | Freq: Three times a day (TID) | ORAL | Status: DC | PRN

## 2015-04-04 NOTE — Telephone Encounter (Signed)
Please alert that I filled flexeril this should cover or be minimal payment.

## 2015-04-04 NOTE — Telephone Encounter (Signed)
Spoke with pt advised another Rx was sent in. Pt understood.

## 2015-04-18 ENCOUNTER — Ambulatory Visit (INDEPENDENT_AMBULATORY_CARE_PROVIDER_SITE_OTHER): Payer: 59 | Admitting: Medical

## 2015-04-18 ENCOUNTER — Encounter: Payer: Self-pay | Admitting: Medical

## 2015-04-18 VITALS — BP 130/90 | HR 75 | Temp 98.4°F | Ht 73.0 in | Wt 226.0 lb

## 2015-04-18 DIAGNOSIS — R0981 Nasal congestion: Secondary | ICD-10-CM | POA: Diagnosis not present

## 2015-04-18 DIAGNOSIS — H6503 Acute serous otitis media, bilateral: Secondary | ICD-10-CM | POA: Diagnosis not present

## 2015-04-18 DIAGNOSIS — R059 Cough, unspecified: Secondary | ICD-10-CM

## 2015-04-18 DIAGNOSIS — J029 Acute pharyngitis, unspecified: Secondary | ICD-10-CM

## 2015-04-18 DIAGNOSIS — R05 Cough: Secondary | ICD-10-CM | POA: Diagnosis not present

## 2015-04-18 DIAGNOSIS — B2790 Infectious mononucleosis, unspecified without complication: Secondary | ICD-10-CM

## 2015-04-18 LAB — POCT RAPID STREP A (OFFICE): Rapid Strep A Screen: NEGATIVE

## 2015-04-18 MED ORDER — FLUTICASONE PROPIONATE 50 MCG/ACT NA SUSP: 2.0000 | Freq: Every day | NASAL | Status: DC

## 2015-04-18 MED ORDER — AZITHROMYCIN 250 MG PO TABS: ORAL_TABLET | ORAL | Status: DC

## 2015-04-18 MED ORDER — BENZONATATE 100 MG PO CAPS: 100.0000 mg | ORAL_CAPSULE | Freq: Three times a day (TID) | ORAL | Status: DC | PRN

## 2015-04-18 NOTE — Progress Notes (Signed)
Pre visit review using our clinic review tool, if applicable. No additional management support is needed unless otherwise documented below in the visit note. 

## 2015-04-18 NOTE — Patient Instructions (Addendum)
For nasal congestion will rx flonase.   For cough will rx benzonatate.  Your rapid strep test states negative. Some suspicious finding on exam in terms of appearance of pharynx redness/size and lt submandibular node enlarged. Also both tm mild red.(possilble early OM)  Start the nasal spray and benazonatate. If your symptoms worse st, ear pain, sinus pressure or chest congestion then start azithromycin antibiotic. I am sending the prescription to your pharmacy.

## 2015-04-18 NOTE — Progress Notes (Signed)
Subjective:    Patient ID: Calvin Williams., male    DOB: 1956/02/07, 59 y.o.   MRN: [id number]  HPI  Pt in with one week of cough, st, nasal congestion and dry cough.   Slight sneezing and itching eyes but not much.  Mild- moderate st. Grand child sick recently. Some chest congestion.   No fevers, no chills or sweats.   Pt denies hx of allergies this time of year.  Review of Systems  Constitutional: Negative for fever, chills and fatigue.  HENT: Positive for congestion, postnasal drip, sinus pressure, sneezing and sore throat. Negative for drooling and ear pain.   Eyes: Positive for itching.  Respiratory: Positive for cough. Negative for chest tightness, shortness of breath and wheezing.   Cardiovascular: Negative for chest pain and palpitations.  Musculoskeletal: Negative for back pain.  Neurological: Negative for dizziness, syncope, weakness and headaches.  Hematological: Does not bruise/bleed easily.  Psychiatric/Behavioral: Negative for behavioral problems and confusion.   Past Medical History  Diagnosis Date  . Diabetes mellitus 2009    adult onset  . Hypertension   . Glomerulonephritis 12/2008    bx provem membranous GN, sees renal q year  . Hyperlipemia   . H/O: hematuria     saw urology 3/09, neg w/u  . Urinary retention 01/2009    was seen at the ER and followup by urology  . Grave's disease     reportedly, history of i nthe past u/s 6/11 slightly enlarged gland w/o nodules  . Decreased hearing     Left  . Secondary hyperparathyroidism, renal (HCC)     Dr. Hyman HopesWebb    Social History   Social History  . Marital Status: Married    Spouse Name: N/A  . Number of Children: 3  . Years of Education: N/A   Occupational History  . welder    Social History Main Topics  . Smoking status: Former Smoker -- 1.00 packs/day    Quit date: 04/17/1989  . Smokeless tobacco: Never Used  . Alcohol Use: No  . Drug Use: No  . Sexual Activity: Not on file   Other  Topics Concern  . Not on file   Social History Narrative   Original from [place], moved to [place] in the 80s , lives w/ wife    Past Surgical History  Procedure Laterality Date  . No past surgeries      Family History  Problem Relation Age of Onset  . Diabetes Other     GM  . Heart attack Neg Hx   . Colon cancer Neg Hx   . Prostate cancer Neg Hx     Allergies  Allergen Reactions  . Penicillins     Current Outpatient Prescriptions on File Prior to Visit  Medication Sig Dispense Refill  . aspirin 81 MG tablet Take 81 mg by mouth daily.      Marland Kitchen. atorvastatin (LIPITOR) 80 MG tablet Take 1 tablet (80 mg total) by mouth daily. 30 tablet 12  . cyclobenzaprine (FLEXERIL) 10 MG tablet Take 0.5-1 tablets (5-10 mg total) by mouth 3 (three) times daily as needed for muscle spasms. 30 tablet 0  . fluticasone (FLONASE) 50 MCG/ACT nasal spray Place 2 sprays into both nostrils daily. 16 g 1  . glimepiride (AMARYL) 2 MG tablet 1/2 tablet a day (Patient taking differently: Take 2 mg by mouth daily with breakfast. ) 30 tablet 12  . glucose blood (FREESTYLE INSULINX TEST) test strip Test blood sugar once daily.  Dx code: 250.00 100 each 12  . ibuprofen (ADVIL,MOTRIN) 600 MG tablet Take 1 tablet (600 mg total) by mouth every 8 (eight) hours as needed. 30 tablet 0  . ketoconazole (NIZORAL) 2 % cream Apply 1 application topically daily. 60 g 0  . Lancets (FREESTYLE) lancets Check 2 times daily. 100 each 3  . losartan (COZAAR) 100 MG tablet Take 1 tablet (100 mg total) by mouth daily. 30 tablet 12  . saxagliptin HCl (ONGLYZA) 5 MG TABS tablet Take 1 tablet (5 mg total) by mouth daily. 30 tablet 1  . traMADol (ULTRAM) 50 MG tablet Take 1 tablet (50 mg total) by mouth every 8 (eight) hours as needed. 30 tablet 0   No current facility-administered medications on file prior to visit.    BP 130/90 mmHg  Pulse 75  Temp(Src) 98.4 F (36.9 C) (Oral)  Ht  (1.854 m)  Wt 226 lb (102.513 kg)  BMI 29.82  kg/m2  SpO2 98%      Objective:   Physical Exam  General  Mental Status - Alert. General Appearance - Well groomed. Not in acute distress.  Skin Rashes- No Rashes.  HEENT Head- Normal. Ear Auditory Canal - Left- Normal. Right - Normal.Tympanic Membrane- Left- Normal. Right- Normal. Eye Sclera/Conjunctiva- Left- Normal. Right- Normal. Nose & Sinuses Nasal Mucosa- Left-  Boggy and Congested. Right-  Boggy and  Congested.Bilateral maxillary and frontal sinus pressure. Mouth & Throat Lips: Upper Lip- Normal: no dryness, cracking, pallor, cyanosis, or vesicular eruption. Lower Lip-Normal: no dryness, cracking, pallor, cyanosis or vesicular eruption. Buccal Mucosa- Bilateral- No Aphthous ulcers. Oropharynx- No Discharge or Erythema. Tonsils: Characteristics- Bilateral- Moderate Erythema + Congestion. Size/Enlargement- Bilateral- No enlargement. Discharge- bilateral-None.  Neck Neck- Supple. No Masses.   Chest and Lung Exam Auscultation: Breath Sounds:-Clear even and unlabored.  Cardiovascular Auscultation:Rythm- Regular, rate and rhythm. Murmurs & Other Heart Sounds:Ausculatation of the heart reveal- No Murmurs.  Lymphatic Head & Neck General Head & Neck Lymphatics: Bilateral: Description- lt submandibular node moderate swollen and tender.      Assessment & Plan:  For nasal congestion will rx flonase.   For cough will rx benzonatate.  Your rapid strep test states negative. Some suspicious finding on exam in terms of appearance of pharynx and lt submandibular enlarged. Also both tm mild red.(possilble early OM)  Start the nasal spray and benazonatate. If your symptoms worse st, ear pain, sinus pressure or chest congestion then start azithromycin antibiotic. I am sending the prescription to your pharmacy.

## 2015-04-18 NOTE — Addendum Note (Signed)
Addended by: Neldon LabellaMABE, Abagayle Klutts S on: 04/18/2015 01:45 PM   Modules accepted: Orders

## 2015-04-24 ENCOUNTER — Other Ambulatory Visit: Payer: Self-pay | Admitting: Internal Medicine

## 2015-06-11 ENCOUNTER — Other Ambulatory Visit: Payer: Self-pay | Admitting: Internal Medicine

## 2015-06-11 NOTE — Telephone Encounter (Signed)
Pt says that he was told by the pharmacy that his Rx has expired for a few medications.   1. Atorvastatin  2. Lancets  3. Losartan   Pt is requesting a new Rx.   [facility] [address] [place], [place] - [address]   CB [phone]

## 2015-06-12 ENCOUNTER — Other Ambulatory Visit: Payer: Self-pay | Admitting: Internal Medicine

## 2015-06-12 NOTE — Telephone Encounter (Signed)
Relation to QM:VHQIpt:self Call back number:[phone] [facility] [address], [place] - [address] [phone] (Phone) [phone] (Fax)        Reason for call:  Patient checking on the status of the following medication refill: atorvastatin (LIPITOR) 80 MG tablet  losartan (COZAAR) 100 MG tablet  saxagliptin HCl (ONGLYZA) 5 MG TABS tablet  glimepiride (AMARYL) 2 MG tablet

## 2015-08-31 ENCOUNTER — Ambulatory Visit (INDEPENDENT_AMBULATORY_CARE_PROVIDER_SITE_OTHER): Payer: 59 | Admitting: Internal Medicine

## 2015-08-31 ENCOUNTER — Encounter: Payer: Self-pay | Admitting: Internal Medicine

## 2015-08-31 VITALS — BP 122/64 | HR 64 | Temp 97.8°F | Ht 73.0 in | Wt 227.4 lb

## 2015-08-31 DIAGNOSIS — E119 Type 2 diabetes mellitus without complications: Secondary | ICD-10-CM | POA: Diagnosis not present

## 2015-08-31 DIAGNOSIS — N032 Chronic nephritic syndrome with diffuse membranous glomerulonephritis: Secondary | ICD-10-CM | POA: Diagnosis not present

## 2015-08-31 DIAGNOSIS — I10 Essential (primary) hypertension: Secondary | ICD-10-CM

## 2015-08-31 DIAGNOSIS — E785 Hyperlipidemia, unspecified: Secondary | ICD-10-CM | POA: Diagnosis not present

## 2015-08-31 DIAGNOSIS — Z09 Encounter for follow-up examination after completed treatment for conditions other than malignant neoplasm: Secondary | ICD-10-CM | POA: Insufficient documentation

## 2015-08-31 DIAGNOSIS — N052 Unspecified nephritic syndrome with diffuse membranous glomerulonephritis: Secondary | ICD-10-CM

## 2015-08-31 LAB — BASIC METABOLIC PANEL
BUN: 18 mg/dL (ref 6–23)
CO2: 29 mEq/L (ref 19–32)
Calcium: 9.1 mg/dL (ref 8.4–10.5)
Chloride: 103 mEq/L (ref 96–112)
Creatinine, Ser: 1.11 mg/dL (ref 0.40–1.50)
GFR: 71.58 mL/min (ref >60.00)
Glucose, Bld: 118 mg/dL — ABNORMAL HIGH (ref 70–99)
Potassium: 3.8 mEq/L (ref 3.5–5.1)
Sodium: 136 mEq/L (ref 135–145)

## 2015-08-31 LAB — ALT: ALT: 19 U/L (ref 0–53)

## 2015-08-31 LAB — AST: AST: 18 U/L (ref 0–37)

## 2015-08-31 LAB — LIPID PANEL
Cholesterol: 169 mg/dL (ref 0–200)
HDL: 48.1 mg/dL (ref >39.00)
LDL Cholesterol: 95 mg/dL (ref 0–99)
NonHDL: 121.11
Total CHOL/HDL Ratio: 4
Triglycerides: 133 mg/dL (ref 0.0–149.0)
VLDL: 26.6 mg/dL (ref 0.0–40.0)

## 2015-08-31 LAB — HEMOGLOBIN A1C: Hgb A1c MFr Bld: 6.8 % — ABNORMAL HIGH (ref 4.6–6.5)

## 2015-08-31 NOTE — Progress Notes (Signed)
Pre visit review using our clinic review tool, if applicable. No additional management support is needed unless otherwise documented below in the visit note. 

## 2015-08-31 NOTE — Patient Instructions (Signed)
GO TO THE LAB : Get the blood work    GO TO THE FRONT DESK  Schedule a routine office visit or check up to be done in  4 months  No fasting    Check with your insurance ASAP, alternatives to onglyza  Januvia, sitagliptin Tradjenta, Linaglitin   Nesina, alogliptin

## 2015-08-31 NOTE — Assessment & Plan Note (Signed)
DM: On glimepiride, stopped Onglyza due to cost. I think in the long-term will need an alternative, recommend patient to call his insurance, see instruction. Check A1c HTN: Seems well-controlled, check a BMP Cholesterol : On Lipitor 80 mg, check a FLP Glomerulonephritis, note from nephrology  11/10/2014 reviewed. Creatinine 1.1, sodium 138, potassium 3.9. CBC normal. RTC 4 months

## 2015-08-31 NOTE — Progress Notes (Signed)
Subjective:    Patient ID: Calvin Burget Sr., male    DOB: Feb 01, 1956, 60 y.o.   MRN: [id number]  DOS:  08/31/2015 Type of visit - description : Routine checkup Interval history: DM: He is not taking Onglyza for about a month  due to cost, CBGs have not changed much, usually in the 130, 140 (fasting or not). High cholesterol: Good medication compliance, on Lipitor. Due for labs HTN: Good medication compliance ambulatory BPs usually in the 120/80 range.   Review of Systems Denies chest pain or difficulty breathing. No nausea, vomiting, diarrhea. No gross hematuria.   Past Medical History  Diagnosis Date  . Diabetes mellitus 2009    adult onset  . Hypertension   . Glomerulonephritis 12/2008    bx provem membranous GN, sees renal q year  . Hyperlipemia   . H/O: hematuria     saw urology 3/09, neg w/u  . Urinary retention 01/2009    was seen at the ER and followup by urology  . Grave's disease     reportedly, history of i nthe past u/s 6/11 slightly enlarged gland w/o nodules  . Decreased hearing     Left  . Secondary hyperparathyroidism, renal (HCC)     Dr. [name]    Past Surgical History  Procedure Laterality Date  . No past surgeries      Social History   Social History  . Marital Status: Married    Spouse Name: N/A  . Number of Children: 3  . Years of Education: N/A   Occupational History  . welder    Social History Main Topics  . Smoking status: Former Smoker -- 1.00 packs/day    Quit date: 04/17/1989  . Smokeless tobacco: Never Used  . Alcohol Use: No  . Drug Use: No  . Sexual Activity: Not on file   Other Topics Concern  . Not on file   Social History Narrative   Original from [place], moved to [place] in the 80s , lives w/ wife        Medication List       This list is accurate as of: 08/31/15  5:47 PM.  Always use your most recent med list.               aspirin 81 MG tablet  Take 81 mg by mouth daily. Reported on 08/31/2015     atorvastatin 80 MG tablet  Commonly known as:  LIPITOR  Take 1 tablet (80 mg total) by mouth daily.     freestyle lancets  Check 2 times daily.     glimepiride 2 MG tablet  Commonly known as:  AMARYL  Take 0.5 tablets (1 mg total) by mouth daily with breakfast.     glucose blood test strip  Commonly known as:  FREESTYLE INSULINX TEST  Test blood sugar once daily. Dx code: 250.00     ketoconazole 2 % cream  Commonly known as:  NIZORAL  Apply 1 application topically daily.     losartan 100 MG tablet  Commonly known as:  COZAAR  Take 1 tablet (100 mg total) by mouth daily.           Objective:   Physical Exam BP 122/64 mmHg  Pulse 64  Temp(Src) 97.8 F (36.6 C) (Oral)  Ht  (1.854 m)  Wt 227 lb 6 oz (103.137 kg)  BMI 30.01 kg/m2  SpO2 98% General:   Well developed, well nourished . NAD.  HEENT:  Normocephalic .  Face symmetric, atraumatic Lungs:  CTA B Normal respiratory effort, no intercostal retractions, no accessory muscle use. Heart: RRR,  no murmur.  No pretibial edema bilaterally  Skin: Not pale. Not jaundice Neurologic:  alert & oriented X3.  Speech normal, gait appropriate for age and unassisted Psych--  Cognition and judgment appear intact.  Cooperative with normal attention span and concentration.  Behavior appropriate. No anxious or depressed appearing.      Assessment & Plan:   Assessment DM 2009 HTN Hyperlipidemia Renal: ---Glomerulonephritis 2010, BX proven ---Secondary hyperparathyroidism HOH L GU H/o Hematuria, urology w/u 09/2007 H/o Urinary retention 2010  PLAN: DM: On glimepiride, stopped Onglyza due to cost. I think in the long-term will need an alternative, recommend patient to call his insurance, see instruction. Check A1c HTN: Seems well-controlled, check a BMP Cholesterol : On Lipitor 80 mg, check a FLP Glomerulonephritis, note from nephrology  11/10/2014 reviewed. Creatinine 1.1, sodium 138, potassium 3.9. CBC  normal. RTC 4 months

## 2015-11-30 LAB — CBC AND DIFFERENTIAL
HCT: 44 % (ref 41–53)
Hemoglobin: 15.6 g/dL (ref 13.5–17.5)
Neutrophils Absolute: 4 /uL
Platelets: 169 10*3/uL (ref 150–399)
WBC: 6 10^3/mL

## 2015-11-30 LAB — BASIC METABOLIC PANEL
BUN: 17 mg/dL (ref 4–21)
Creatinine: 1.2 mg/dL (ref 0.6–1.3)
Glucose: 156 mg/dL
Potassium: 4 mmol/L (ref 3.4–5.3)
Sodium: 137 mmol/L (ref 137–147)

## 2015-12-07 ENCOUNTER — Encounter: Payer: Self-pay | Admitting: Internal Medicine

## 2015-12-21 ENCOUNTER — Encounter: Payer: Self-pay | Admitting: Internal Medicine

## 2015-12-21 ENCOUNTER — Ambulatory Visit (INDEPENDENT_AMBULATORY_CARE_PROVIDER_SITE_OTHER): Payer: 59 | Admitting: Internal Medicine

## 2015-12-21 VITALS — BP 132/78 | HR 67 | Temp 98.4°F | Ht 73.0 in | Wt 230.1 lb

## 2015-12-21 DIAGNOSIS — Z23 Encounter for immunization: Secondary | ICD-10-CM | POA: Diagnosis not present

## 2015-12-21 DIAGNOSIS — E118 Type 2 diabetes mellitus with unspecified complications: Secondary | ICD-10-CM | POA: Diagnosis not present

## 2015-12-21 DIAGNOSIS — E785 Hyperlipidemia, unspecified: Secondary | ICD-10-CM | POA: Diagnosis not present

## 2015-12-21 DIAGNOSIS — I10 Essential (primary) hypertension: Secondary | ICD-10-CM | POA: Diagnosis not present

## 2015-12-21 LAB — CK: Total CK: 174 U/L (ref 7–232)

## 2015-12-21 LAB — HEMOGLOBIN A1C: Hgb A1c MFr Bld: 6.8 % — ABNORMAL HIGH (ref 4.6–6.5)

## 2015-12-21 NOTE — Patient Instructions (Addendum)
GO TO THE LAB : Get the blood work     GO TO THE FRONT DESK Schedule your next appointment for a  physical exam, 4 months. Fasting.   Stop Lipitor for 3 weeks, then restart. Let me know how your aches and pains are in 2 months.   DEJE DE TOMAR LIPITOR POR 3 SEMANAS, LUEGO EMPIEZE DE NUEVO. EN DOS MESES AVISEME COMO ESTAN SUS DOLORES MUSCULARES

## 2015-12-21 NOTE — Progress Notes (Signed)
Pre visit review using our clinic review tool, if applicable. No additional management support is needed unless otherwise documented below in the visit note. 

## 2015-12-21 NOTE — Progress Notes (Signed)
Subjective:    Patient ID: Calvin LimboJorge Weida Sr., male    DOB: 12/23/1955, 60 y.o.   MRN: [id number]  DOS:  12/21/2015 Type of visit - description : Routine office visit Interval history: DM: Unable to afford some of his medication, CBGs 120, 140. Reports aches and pains mostly at the proximal upper extremities, denies neck pain, lower extremity pains. No rash. Denies joint aches other than occasional wrist pain.    Review of Systems No chest pain or difficulty breathing  Past Medical History  Diagnosis Date  . Diabetes mellitus 2009    adult onset  . Hypertension   . Glomerulonephritis 12/2008    bx provem membranous GN, sees renal q year  . Hyperlipemia   . H/O: hematuria     saw urology 3/09, neg w/u  . Urinary retention 01/2009    was seen at the ER and followup by urology  . Grave's disease     reportedly, history of i nthe past u/s 6/11 slightly enlarged gland w/o nodules  . Decreased hearing     Left  . Secondary hyperparathyroidism, renal (HCC)     Dr. Hyman HopesWebb    Past Surgical History  Procedure Laterality Date  . No past surgeries      Social History   Social History  . Marital Status: Married    Spouse Name: N/A  . Number of Children: 3  . Years of Education: N/A   Occupational History  . welder    Social History Main Topics  . Smoking status: Former Smoker -- 1.00 packs/day    Quit date: 04/17/1989  . Smokeless tobacco: Never Used  . Alcohol Use: No  . Drug Use: No  . Sexual Activity: Not on file   Other Topics Concern  . Not on file   Social History Narrative   Original from [place], moved to [place] in the 80s , lives w/ wife        Medication List       This list is accurate as of: 12/21/15 11:59 PM.  Always use your most recent med list.               aspirin 81 MG tablet  Take 81 mg by mouth daily. Reported on 08/31/2015     atorvastatin 80 MG tablet  Commonly known as:  LIPITOR  Take 1 tablet (80 mg total) by mouth daily.     freestyle lancets  Check 2 times daily.     glimepiride 2 MG tablet  Commonly known as:  AMARYL  Take 0.5 tablets (1 mg total) by mouth daily with breakfast.     glucose blood test strip  Commonly known as:  FREESTYLE INSULINX TEST  Test blood sugar once daily. Dx code: 250.00     ketoconazole 2 % cream  Commonly known as:  NIZORAL  Apply 1 application topically daily.     losartan 100 MG tablet  Commonly known as:  COZAAR  Take 1 tablet (100 mg total) by mouth daily.           Objective:   Physical Exam BP 132/78 mmHg  Pulse 67  Temp(Src) 98.4 F (36.9 C) (Oral)  Ht 6\' 1"  (1.854 m)  Wt 230 lb 2 oz (104.384 kg)  BMI 30.37 kg/m2  SpO2 97% General:   Well developed, well nourished . NAD.  HEENT:  Normocephalic . Face symmetric, atraumatic Lungs:  CTA B Normal respiratory effort, no intercostal retractions, no accessory muscle use. Heart:  RRR,  no murmur.  No pretibial edema bilaterally  DIABETIC FEET EXAM: No lower extremity edema Normal pedal pulses bilaterally Skin normal, nails dystrophic, few calluses Pinprick examination of the feet normal. Neurologic:  alert & oriented X3.  Speech normal, gait appropriate for age and unassisted Psych--  Cognition and judgment appear intact.  Cooperative with normal attention span and concentration.  Behavior appropriate. No anxious or depressed appearing.      Assessment & Plan:   Assessment DM 2009 HTN Hyperlipidemia Renal: Dr [name] ---Glomerulonephritis 2010, BX proven ---Secondary hyperparathyroidism HOH L GU H/o Hematuria, urology w/u 09/2007 H/o Urinary retention 2010  PLAN: DM: On glimepiride only, unable to afford Onglyza or similar medications. Will check A1c, increase glimepiride dose if needed. Foot exam negative except for dystrophic nails HTN: Controlled on losartan, BMP last month at nephrology satisfactory Cholesterol:  well-controlled, has developed aches at the UEs, related to Lipitor ? Will  recheck CK, hold Lipitor for 3 weeks then restart. Primary care: prevnar Today. RTC 4 CPX

## 2015-12-22 NOTE — Assessment & Plan Note (Signed)
DM: On glimepiride only, unable to afford Onglyza or similar medications. Will check A1c, increase glimepiride dose if needed. Foot exam negative except for dystrophic nails HTN: Controlled on losartan, BMP last month at nephrology satisfactory Cholesterol:  well-controlled, has developed aches at the UEs, related to Lipitor ? Will recheck CK, hold Lipitor for 3 weeks then restart. Primary care: prevnar Today. RTC 4 CPX

## 2016-04-05 ENCOUNTER — Other Ambulatory Visit: Payer: Self-pay | Admitting: Internal Medicine

## 2016-04-07 ENCOUNTER — Telehealth: Payer: Self-pay | Admitting: Internal Medicine

## 2016-04-07 MED ORDER — ATORVASTATIN CALCIUM 80 MG PO TABS: 80.0000 mg | ORAL_TABLET | Freq: Every day | ORAL | 6 refills | Status: DC

## 2016-04-07 MED ORDER — GLIMEPIRIDE 2 MG PO TABS: 1.0000 mg | ORAL_TABLET | Freq: Every day | ORAL | 6 refills | Status: DC

## 2016-04-07 NOTE — Telephone Encounter (Signed)
Rxs sent

## 2016-04-07 NOTE — Telephone Encounter (Signed)
Relation to ZO:XWRUpt:self Call back [phone] [facility] [address] - [place], [place] - [address] [phone] (Phone) [phone])     Reason for call:  Patient requesting a refill atorvastatin (LIPITOR) 80 MG tablet and glimepiride (AMARYL) 2 MG tablet

## 2016-06-20 ENCOUNTER — Encounter: Payer: Self-pay | Admitting: Internal Medicine

## 2016-06-20 ENCOUNTER — Ambulatory Visit (INDEPENDENT_AMBULATORY_CARE_PROVIDER_SITE_OTHER): Payer: 59 | Admitting: Internal Medicine

## 2016-06-20 VITALS — BP 138/68 | HR 67 | Temp 97.9°F | Resp 14 | Ht 73.0 in | Wt 233.2 lb

## 2016-06-20 DIAGNOSIS — Z0000 Encounter for general adult medical examination without abnormal findings: Secondary | ICD-10-CM

## 2016-06-20 DIAGNOSIS — E118 Type 2 diabetes mellitus with unspecified complications: Secondary | ICD-10-CM

## 2016-06-20 DIAGNOSIS — Z23 Encounter for immunization: Secondary | ICD-10-CM

## 2016-06-20 LAB — LIPID PANEL
Cholesterol: 183 mg/dL (ref 0–200)
HDL: 51.4 mg/dL (ref >39.00)
LDL Cholesterol: 112 mg/dL — ABNORMAL HIGH (ref 0–99)
NonHDL: 131.11
Total CHOL/HDL Ratio: 4
Triglycerides: 97 mg/dL (ref 0.0–149.0)
VLDL: 19.4 mg/dL (ref 0.0–40.0)

## 2016-06-20 LAB — AST: AST: 21 U/L (ref 0–37)

## 2016-06-20 LAB — BASIC METABOLIC PANEL
BUN: 22 mg/dL (ref 6–23)
CO2: 29 mEq/L (ref 19–32)
Calcium: 9 mg/dL (ref 8.4–10.5)
Chloride: 106 mEq/L (ref 96–112)
Creatinine, Ser: 1.25 mg/dL (ref 0.40–1.50)
GFR: 62.24 mL/min (ref >60.00)
Glucose, Bld: 134 mg/dL — ABNORMAL HIGH (ref 70–99)
Potassium: 4.2 mEq/L (ref 3.5–5.1)
Sodium: 140 mEq/L (ref 135–145)

## 2016-06-20 LAB — HEMOGLOBIN A1C: Hgb A1c MFr Bld: 6.8 % — ABNORMAL HIGH (ref 4.6–6.5)

## 2016-06-20 LAB — ALT: ALT: 23 U/L (ref 0–53)

## 2016-06-20 MED ORDER — FREESTYLE LANCETS MISC: 12 refills | Status: AC

## 2016-06-20 MED ORDER — GLUCOSE BLOOD VI STRP: ORAL_STRIP | 12 refills | Status: AC

## 2016-06-20 NOTE — Progress Notes (Signed)
Pre visit review using our clinic review tool, if applicable. No additional management support is needed unless otherwise documented below in the visit note. 

## 2016-06-20 NOTE — Progress Notes (Signed)
Subjective:    Patient ID: Calvin LimboJorge Heuerman Sr., male    DOB: 09-12-1955, 60 y.o.   MRN: [id number]  DOS:  06/20/2016 Type of visit - description : CPX Interval history: No major concerns. He remains active, good compliance of medication.   Review of Systems  Constitutional: No fever. No chills. No unexplained wt changes. No unusual sweats  HEENT: No dental problems, no ear discharge, no facial swelling, no voice changes. No eye discharge, no eye  redness , no  intolerance to light   Respiratory: No wheezing , no  difficulty breathing. No cough , no mucus production  Cardiovascular: No CP, no leg swelling , no  Palpitations  GI: Last week saw fresh blood after BM when he wiped. He did have some straining at the time. No rectal pain or itching. No further symptoms. Otherwise GI ROS negative  no nausea, no vomiting, no diarrhea , no  abdominal pain.  No dysphagia, no odynophagia    Endocrine: No polyphagia, no polyuria , no polydipsia  GU: No dysuria, gross hematuria, difficulty urinating. No urinary urgency, no frequency.  Musculoskeletal: No joint swellings or unusual aches or pains  Skin: No change in the color of the skin, palor , no  Rash  Allergic, immunologic: No environmental allergies , no  food allergies  Neurological: No dizziness no  syncope. No headaches. No diplopia, no slurred, no slurred speech, no motor deficits, no facial  Numbness  Hematological: No enlarged lymph nodes, no easy bruising , no unusual bleedings  Psychiatry: No suicidal ideas, no hallucinations, no beavior problems, no confusion.  No unusual/severe anxiety, no depression    Past Medical History:  Diagnosis Date  . Decreased hearing    Left  . Diabetes mellitus 2009   adult onset  . Glomerulonephritis 12/2008   bx provem membranous GN, sees renal q year  . Grave's disease    reportedly, history of i nthe past u/s 6/11 slightly enlarged gland w/o nodules  . H/O: hematuria    saw  urology 3/09, neg w/u  . Hyperlipemia   . Hypertension   . Secondary hyperparathyroidism, renal (HCC)    Dr. Hyman HopesWebb  . Urinary retention 01/2009   was seen at the ER and followup by urology    Past Surgical History:  Procedure Laterality Date  . NO PAST SURGERIES      Social History   Social History  . Marital status: Married    Spouse name: N/A  . Number of children: 3  . Years of education: N/A   Occupational History  . welder    Social History Main Topics  . Smoking status: Former Smoker    Packs/day: 1.00    Quit date: 04/17/1989  . Smokeless tobacco: Never Used  . Alcohol use No  . Drug use: No  . Sexual activity: Not on file   Other Topics Concern  . Not on file   Social History Narrative   Original from [place], moved to [place] in the 80s , lives w/ wife     Family History  Problem Relation Age of Onset  . Diabetes Other     GM  . Heart attack Neg Hx   . Colon cancer Neg Hx   . Prostate cancer Neg Hx      Allergies as of 06/20/2016      Reactions   Penicillins       Medication List       Accurate as of 06/20/16  2:22  PM. Always use your most recent med list.          aspirin 81 MG tablet Take 81 mg by mouth daily. Reported on 08/31/2015   atorvastatin 80 MG tablet Commonly known as:  LIPITOR Take 1 tablet (80 mg total) by mouth daily.   freestyle lancets Check blood sugar once daily   glimepiride 2 MG tablet Commonly known as:  AMARYL Take 0.5 tablets (1 mg total) by mouth daily with breakfast.   glucose blood test strip Commonly known as:  FREESTYLE INSULINX TEST Test blood sugar once daily. Dx code: E11.9   losartan 100 MG tablet Commonly known as:  COZAAR Take 1 tablet (100 mg total) by mouth daily.          Objective:   Physical Exam BP 138/68 (BP Location: Left Arm, Patient Position: Sitting, Cuff Size: Normal)   Pulse 67   Temp 97.9 F (36.6 C) (Oral)   Resp 14   Ht 6\' 1"  (1.854 m)   Wt 233 lb 4 oz (105.8 kg)    SpO2 96%   BMI 30.77 kg/m   General:   Well developed, well nourished . NAD.  Neck: No  thyromegaly  HEENT:  Normocephalic . Face symmetric, atraumatic Lungs:  CTA B Normal respiratory effort, no intercostal retractions, no accessory muscle use. Heart: RRR,  no murmur.  No pretibial edema bilaterally  Abdomen:  Not distended, soft, non-tender. No rebound or rigidity.   Skin: Exposed areas without rash. Not pale. Not jaundice Neurologic:  alert & oriented X3.  Speech normal, gait appropriate for age and unassisted Strength symmetric and appropriate for age.  Psych: Cognition and judgment appear intact.  Cooperative with normal attention span and concentration.  Behavior appropriate. No anxious or depressed appearing.    Assessment & Plan:   Assessment DM 2009 HTN Hyperlipidemia Renal: Dr Hyman HopesWebb ---Glomerulonephritis 2010, BX proven ---Secondary hyperparathyroidism HOH L GU H/o Hematuria, urology w/u 09/2007 H/o Urinary retention 2010  PLAN: DM: Last A1c 6.8 on glimepiride. Checking labs today. Encouraged to continue be active and a good diet. Check A1c. HTN: Continue losartan, check BMP. High cholesterol: Continue Lipitor, check FLP. Red blood per rectum: No red flags, likely a distal local problem. Now resolved. Due for a colonoscopy in few months. Advised to call me if he does not get a letter from GI. RTC 6 months

## 2016-06-20 NOTE — Patient Instructions (Signed)
GO TO THE LAB : Get the blood work     GO TO THE FRONT DESK Schedule your next appointment for a  routine checkup in 6 months  

## 2016-06-20 NOTE — Assessment & Plan Note (Signed)
DM: Last A1c 6.8 on glimepiride. Checking labs today. Encouraged to continue be active and a good diet. Check A1c. HTN: Continue losartan, check BMP. High cholesterol: Continue Lipitor, check FLP. Red blood per rectum: No red flags, likely a distal local problem. Now resolved. Due for a colonoscopy in few months. Advised to call me if he does not get a letter from GI. RTC 6 months

## 2016-06-20 NOTE — Assessment & Plan Note (Addendum)
Td 2011;  pnm shot 09-2014; prevnar 12-2015; zostavax discussed before ; flu shot today  Cscope 12-06 Dr Bosie ClosSchooler:  2 polyps Cscope again 08-2011, +polyp--->  repeat in 5 years   Prostate cancer screening: DRE and  PSA normal 12-2013. PSA stable x years, asx  Diet and exercise discussed

## 2016-10-02 ENCOUNTER — Other Ambulatory Visit: Payer: Self-pay | Admitting: Internal Medicine

## 2016-11-18 ENCOUNTER — Telehealth: Payer: Self-pay | Admitting: Internal Medicine

## 2016-11-18 MED ORDER — GLIMEPIRIDE 2 MG PO TABS: 1.0000 mg | ORAL_TABLET | Freq: Every day | ORAL | 3 refills | Status: DC

## 2016-11-18 MED ORDER — LOSARTAN POTASSIUM 100 MG PO TABS: 100.0000 mg | ORAL_TABLET | Freq: Every day | ORAL | 3 refills | Status: DC

## 2016-11-18 NOTE — Telephone Encounter (Signed)
Caller name: Janine LimboJorge Williams Relationship to patient: self Can be reached: [phone] [facility] [address] - [place], [place] - [address]  Reason for call: pt called for refill on losartan and glimepiride. Has a few days left. Pt was informed losartan sent to pharmacy 10/02/16. He states they informed him they have no rx and no refills.

## 2016-11-18 NOTE — Telephone Encounter (Signed)
Rxs sent

## 2016-11-21 ENCOUNTER — Encounter: Payer: Self-pay | Admitting: Internal Medicine

## 2016-11-21 DIAGNOSIS — N2581 Secondary hyperparathyroidism of renal origin: Secondary | ICD-10-CM | POA: Diagnosis not present

## 2016-11-21 DIAGNOSIS — N042 Nephrotic syndrome with diffuse membranous glomerulonephritis: Secondary | ICD-10-CM | POA: Diagnosis not present

## 2016-11-21 DIAGNOSIS — N181 Chronic kidney disease, stage 1: Secondary | ICD-10-CM | POA: Diagnosis not present

## 2016-11-21 LAB — CBC AND DIFFERENTIAL
HCT: 47 % (ref 41–53)
Hemoglobin: 16.5 g/dL (ref 13.5–17.5)
Neutrophils Absolute: 4 /uL
Platelets: 183 10*3/uL (ref 150–399)
WBC: 6.3 10^3/mL

## 2016-11-21 LAB — BASIC METABOLIC PANEL
BUN: 21 mg/dL (ref 4–21)
Creatinine: 1.2 mg/dL (ref 0.6–1.3)
Glucose: 139 mg/dL
Potassium: 4 mmol/L (ref 3.4–5.3)
Sodium: 138 mmol/L (ref 137–147)

## 2016-12-04 ENCOUNTER — Encounter: Payer: Self-pay | Admitting: Internal Medicine

## 2016-12-26 ENCOUNTER — Ambulatory Visit: Payer: 59 | Admitting: Internal Medicine

## 2017-01-23 ENCOUNTER — Ambulatory Visit (INDEPENDENT_AMBULATORY_CARE_PROVIDER_SITE_OTHER): Payer: 59 | Admitting: Internal Medicine

## 2017-01-23 ENCOUNTER — Encounter: Payer: Self-pay | Admitting: Internal Medicine

## 2017-01-23 VITALS — BP 146/98 | HR 61 | Temp 98.4°F | Resp 14 | Ht 73.0 in | Wt 234.2 lb

## 2017-01-23 DIAGNOSIS — R079 Chest pain, unspecified: Secondary | ICD-10-CM

## 2017-01-23 DIAGNOSIS — E118 Type 2 diabetes mellitus with unspecified complications: Secondary | ICD-10-CM

## 2017-01-23 DIAGNOSIS — I10 Essential (primary) hypertension: Secondary | ICD-10-CM | POA: Diagnosis not present

## 2017-01-23 LAB — CBC WITH DIFFERENTIAL/PLATELET
Basophils Absolute: 0 10*3/uL (ref 0.0–0.1)
Basophils Relative: 0.7 % (ref 0.0–3.0)
Eosinophils Absolute: 0.1 10*3/uL (ref 0.0–0.7)
Eosinophils Relative: 1.9 % (ref 0.0–5.0)
HCT: 42.8 % (ref 39.0–52.0)
Hemoglobin: 14.8 g/dL (ref 13.0–17.0)
Lymphocytes Relative: 23.9 % (ref 12.0–46.0)
Lymphs Abs: 1.2 10*3/uL (ref 0.7–4.0)
MCHC: 34.6 g/dL (ref 30.0–36.0)
MCV: 86.4 fl (ref 78.0–100.0)
Monocytes Absolute: 0.4 10*3/uL (ref 0.1–1.0)
Monocytes Relative: 8 % (ref 3.0–12.0)
Neutro Abs: 3.3 10*3/uL (ref 1.4–7.7)
Neutrophils Relative %: 65.5 % (ref 43.0–77.0)
Platelets: 207 10*3/uL (ref 150.0–400.0)
RBC: 4.95 Mil/uL (ref 4.22–5.81)
RDW: 13.8 % (ref 11.5–15.5)
WBC: 5.1 10*3/uL (ref 4.0–10.5)

## 2017-01-23 LAB — BASIC METABOLIC PANEL
BUN: 18 mg/dL (ref 6–23)
CO2: 27 mEq/L (ref 19–32)
Calcium: 9 mg/dL (ref 8.4–10.5)
Chloride: 104 mEq/L (ref 96–112)
Creatinine, Ser: 1.23 mg/dL (ref 0.40–1.50)
GFR: 63.29 mL/min (ref >60.00)
Glucose, Bld: 157 mg/dL — ABNORMAL HIGH (ref 70–99)
Potassium: 3.7 mEq/L (ref 3.5–5.1)
Sodium: 137 mEq/L (ref 135–145)

## 2017-01-23 LAB — HEMOGLOBIN A1C: Hgb A1c MFr Bld: 7.2 % — ABNORMAL HIGH (ref 4.6–6.5)

## 2017-01-23 MED ORDER — AMLODIPINE BESYLATE 5 MG PO TABS: 5.0000 mg | ORAL_TABLET | Freq: Every day | ORAL | 6 refills | Status: DC

## 2017-01-23 NOTE — Patient Instructions (Addendum)
GO TO THE LAB : Get the blood work     GO TO THE FRONT DESK Schedule your next appointment for a  routine checkup in 3 months   Start amlodipine   Check the  blood pressure 2 or 3 times a   week   Be sure your blood pressure is between 110/65 and  145/85. If it is consistently higher or lower, let me know

## 2017-01-23 NOTE — Progress Notes (Signed)
Pre visit review using our clinic review tool, if applicable. No additional management support is needed unless otherwise documented below in the visit note. 

## 2017-01-23 NOTE — Progress Notes (Signed)
Subjective:    Patient ID: Calvin LimboJorge Sylvestre Sr., Calvin Williams    DOB: 1955-12-04, 61 y.o.   MRN: [id number]  DOS:  01/23/2017 Type of visit - description : rov Interval history: -3 weeks ago had an episode of chest pain: Located at the right upper chest and anterior mid chest. Described as pressure, no radiation, lasted 24 hours and decreased after he took a laxative? ("OTC purgant"). At the time, he has no palpitation, weakness, lower extremity edema, calf pain. He has acid reflux on and off, that particular day he didn't. Mild shortness of breath? Pain was steady, did not change with walking. No further symptoms. -HTN: BP today slightly elevated upon arrival, good medication compliance, ambulatory BPs in the 140s. -DM: No recent CBGs, on glimepiride.   Review of Systems  Denies cough. No nausea, vomiting, diarrhea. No blood in the stools. No airplane trip or prolonged car trips before the episode of chest pain. Did have a trip to [place] last week.   Past Medical History:  Diagnosis Date  . Decreased hearing    Left  . Diabetes mellitus 2009   adult onset  . Glomerulonephritis 12/2008   bx provem membranous GN, sees renal q year  . Grave's disease    reportedly, history of i nthe past u/s 6/11 slightly enlarged gland w/o nodules  . H/O: hematuria    saw urology 3/09, neg w/u  . Hyperlipemia   . Hypertension   . Secondary hyperparathyroidism, renal (HCC)    Dr. Hyman HopesWebb  . Urinary retention 01/2009   was seen at the ER and followup by urology    Past Surgical History:  Procedure Laterality Date  . NO PAST SURGERIES      Social History   Social History  . Marital status: Married    Spouse name: N/A  . Number of children: 3  . Years of education: N/A   Occupational History  . welder    Social History Main Topics  . Smoking status: Former Smoker    Packs/day: 1.00    Quit date: 04/17/1989  . Smokeless tobacco: Never Used  . Alcohol use No  . Drug use: No  . Sexual  activity: Not on file   Other Topics Concern  . Not on file   Social History Narrative   Original from [place], moved to [place] in the 80s , lives w/ wife      Allergies as of 01/23/2017      Reactions   Penicillins       Medication List       Accurate as of 01/23/17  5:25 PM. Always use your most recent med list.          amLODipine 5 MG tablet Commonly known as:  NORVASC Take 1 tablet (5 mg total) by mouth daily.   aspirin 81 MG tablet Take 81 mg by mouth daily. Reported on 08/31/2015   atorvastatin 80 MG tablet Commonly known as:  LIPITOR Take 1 tablet (80 mg total) by mouth daily.   freestyle lancets Check blood sugar once daily   glimepiride 2 MG tablet Commonly known as:  AMARYL Take 0.5 tablets (1 mg total) by mouth daily with breakfast.   glucose blood test strip Commonly known as:  FREESTYLE INSULINX TEST Test blood sugar once daily. Dx code: E11.9   losartan 100 MG tablet Commonly known as:  COZAAR Take 1 tablet (100 mg total) by mouth daily.          Objective:  Physical Exam BP (!) 146/98 (BP Location: Left Arm, Patient Position: Sitting, Cuff Size: Small)   Pulse 61   Temp 98.4 F (36.9 C) (Oral)   Resp 14   Ht 6\' 1"  (1.854 m)   Wt 234 lb 4 oz (106.3 kg)   SpO2 100%   BMI 30.91 kg/m   General:   Well developed, well nourished . NAD.  HEENT:  Normocephalic . Face symmetric, atraumatic Lungs:  CTA B Normal respiratory effort, no intercostal retractions, no accessory muscle use. Heart: RRR,  no murmur.  No pretibial edema bilaterally  Abdomen:  Not distended, soft, non-tender. No rebound or rigidity.   DIABETIC FEET EXAM: No lower extremity edema Normal pedal pulses bilaterally Skin normal, nails diastrophic throughout Pinprick examination of the feet normal. Neurologic:  alert & oriented X3.  Speech normal, gait appropriate for age and unassisted Strength symmetric and appropriate for age.  Psych: Cognition and judgment appear  intact.  Cooperative with normal attention span and concentration.  Behavior appropriate. No anxious or depressed appearing.    Assessment & Plan:   Assessment DM 2009 HTN Hyperlipidemia Renal: Dr [name] ---Glomerulonephritis 2010, BX proven ---Secondary hyperparathyroidism HOH L GU H/o Hematuria, urology w/u 09/2007 H/o Urinary retention 2010  PLAN: DM: On glimepiride, last A1c 6.8. Recheck A1c. HTN: Last BMP at  nephrology  satisfactory, BPs in the ambulatory setting in the 140s, today: 150/94, recheck 146/90. Currently on losartan. Needs better control. Check a BMP, add amlodipine 5 mg Chest pain: As described above, lasted one day, not exertional. Patient has diabetes. EKG today: Sinus rhythm, incomplete RBBB. Given risk factors will get a stress test. Check a CBC Glomerulonephritis: Next visit w/ renal 11-2017. (Patient understood to RTC prn) RTC 3 months.

## 2017-01-23 NOTE — Assessment & Plan Note (Signed)
DM: On glimepiride, last A1c 6.8. Recheck A1c. HTN: Last BMP at  nephrology  satisfactory, BPs in the ambulatory setting in the 140s, today: 150/94, recheck 146/90. Currently on losartan. Needs better control. Check a BMP, add amlodipine 5 mg Chest pain: As described above, lasted one day, not exertional. Patient has diabetes. EKG today: Sinus rhythm, incomplete RBBB. Given risk factors will get a stress test. Check a CBC Glomerulonephritis: Next visit w/ renal 11-2017. (Patient understood to RTC prn) RTC 3 months.

## 2017-01-26 ENCOUNTER — Telehealth (HOSPITAL_COMMUNITY): Payer: Self-pay | Admitting: *Deleted

## 2017-01-26 NOTE — Telephone Encounter (Signed)
Patient given detailed instructions per Myocardial Perfusion Study Information Sheet for the test on 01/27/17 at 7:30. Patient notified to arrive 15 minutes early and that it is imperative to arrive on time for appointment to keep from having the test rescheduled.  If you need to cancel or reschedule your appointment, please call the office within 24 hours of your appointment. . Patient verbalized understanding.Daneil DolinSharon S Brooks

## 2017-01-27 ENCOUNTER — Telehealth: Payer: Self-pay | Admitting: Internal Medicine

## 2017-01-27 ENCOUNTER — Ambulatory Visit (HOSPITAL_COMMUNITY): Payer: 59 | Attending: Cardiology

## 2017-01-27 DIAGNOSIS — I10 Essential (primary) hypertension: Secondary | ICD-10-CM | POA: Insufficient documentation

## 2017-01-27 DIAGNOSIS — R079 Chest pain, unspecified: Secondary | ICD-10-CM | POA: Diagnosis not present

## 2017-01-27 DIAGNOSIS — E119 Type 2 diabetes mellitus without complications: Secondary | ICD-10-CM | POA: Diagnosis not present

## 2017-01-27 DIAGNOSIS — R9439 Abnormal result of other cardiovascular function study: Secondary | ICD-10-CM | POA: Diagnosis not present

## 2017-01-27 LAB — MYOCARDIAL PERFUSION IMAGING
LV dias vol: 165 mL (ref 62–150)
LV sys vol: 82 mL
Peak HR: 76 {beats}/min
RATE: 0.28
Rest HR: 57 {beats}/min
SDS: 5
SRS: 6
SSS: 11
TID: 1.13

## 2017-01-27 MED ORDER — GLIMEPIRIDE 2 MG PO TABS: 2.0000 mg | ORAL_TABLET | Freq: Every day | ORAL | 5 refills | Status: DC

## 2017-01-27 MED ORDER — REGADENOSON 0.4 MG/5ML IV SOLN
0.4000 mg | Freq: Once | INTRAVENOUS | Status: AC
  Administered 2017-01-27: 0.4 mg via INTRAVENOUS

## 2017-01-27 MED ORDER — TECHNETIUM TC 99M TETROFOSMIN IV KIT
10.1000 | PACK | Freq: Once | INTRAVENOUS | Status: AC | PRN
  Administered 2017-01-27: 10.1 via INTRAVENOUS
  Filled 2017-01-27: qty 11

## 2017-01-27 MED ORDER — TECHNETIUM TC 99M TETROFOSMIN IV KIT
32.5000 | PACK | Freq: Once | INTRAVENOUS | Status: AC | PRN
  Administered 2017-01-27: 32.5 via INTRAVENOUS
  Filled 2017-01-27: qty 33

## 2017-01-27 NOTE — Addendum Note (Signed)
Addended byConrad [place]: Darly Fails D on: 01/27/2017 10:07 AM   Modules accepted: Orders

## 2017-01-27 NOTE — Telephone Encounter (Signed)
Stress test low risk but has some area of reversible ischemia. 1. Please arrange a referral to cardiology: dx abnormal stress test, reversible ischemia, further evaluation? 2. needs stricter control of DM and cholesterol: -Add Zetia 10 mg one tablet daily #30 and 6 refills -Metformin 500 mg twice a day #60 and 6 refills. -We just increase glimepiride to 1 tablet daily. Discussed with patient who verbalized understanding

## 2017-01-28 MED ORDER — EZETIMIBE 10 MG PO TABS: 10.0000 mg | ORAL_TABLET | Freq: Every day | ORAL | 6 refills | Status: DC

## 2017-01-28 MED ORDER — METFORMIN HCL 500 MG PO TABS: 500.0000 mg | ORAL_TABLET | Freq: Two times a day (BID) | ORAL | 6 refills | Status: DC

## 2017-01-28 NOTE — Telephone Encounter (Signed)
Cardio referral placed. [facility]

## 2017-02-20 ENCOUNTER — Ambulatory Visit (INDEPENDENT_AMBULATORY_CARE_PROVIDER_SITE_OTHER): Payer: 59 | Admitting: Physician Assistant

## 2017-02-20 ENCOUNTER — Encounter: Payer: Self-pay | Admitting: Physician Assistant

## 2017-02-20 VITALS — BP 141/90 | HR 73 | Ht 73.0 in | Wt 229.6 lb

## 2017-02-20 DIAGNOSIS — I10 Essential (primary) hypertension: Secondary | ICD-10-CM

## 2017-02-20 DIAGNOSIS — Z79899 Other long term (current) drug therapy: Secondary | ICD-10-CM | POA: Diagnosis not present

## 2017-02-20 DIAGNOSIS — R9439 Abnormal result of other cardiovascular function study: Secondary | ICD-10-CM

## 2017-02-20 DIAGNOSIS — E119 Type 2 diabetes mellitus without complications: Secondary | ICD-10-CM

## 2017-02-20 DIAGNOSIS — E785 Hyperlipidemia, unspecified: Secondary | ICD-10-CM

## 2017-02-20 DIAGNOSIS — I2510 Atherosclerotic heart disease of native coronary artery without angina pectoris: Secondary | ICD-10-CM

## 2017-02-20 MED ORDER — METOPROLOL TARTRATE 25 MG PO TABS: 12.5000 mg | ORAL_TABLET | Freq: Two times a day (BID) | ORAL | 5 refills | Status: DC

## 2017-02-20 MED ORDER — CLOPIDOGREL BISULFATE 75 MG PO TABS: ORAL_TABLET | ORAL | 5 refills | Status: DC

## 2017-02-20 MED ORDER — NITROGLYCERIN 0.4 MG SL SUBL: 0.4000 mg | SUBLINGUAL_TABLET | SUBLINGUAL | 3 refills | Status: DC | PRN

## 2017-02-20 NOTE — Patient Instructions (Signed)
Medication Instructions:   START these new medications.  -Nitroglycerin 0.4mg  (sublingual tablet). You may take this as needed for chest pain symptoms. This medication is placed under the tongue until it dissolves. Take up to 3 tablets spaced 5 minutes apart for relief of chest pain. If you do not have relief of symptoms after 3rd dose call 911.  -Metoprolol Tartrate 12.5mg  TWICE DAILY. The tablet size is 25mg . Please cut these tablets in half and take 1/2 in AM, 1/2 in PM approximately 12 hours apart.  -Clopidogrel (Plavix). The DAILY dose is 75mg  (1 tablet). The first day you take this medication, you will need to take a loading dose of 300mg  (4 tablets).   Labwork:   Your physician recommends that you return for fasting cholesterol lab work in: 2-3 months (before your next appointment)  Testing/Procedures:  Your physician has requested that you have an echocardiogram. Echocardiography is a painless test that uses sound waves to create images of your heart. It provides your doctor with information about the size and shape of your heart and how well your heart's chambers and valves are working. This procedure takes approximately one hour. There are no restrictions for this procedure.    Follow-Up:  3 months with Dr. [name] (after tests)   If you need a refill on your cardiac medications before your next appointment, please call your pharmacy.  Call if you have any questions, concerns, or further needs at any time.

## 2017-02-20 NOTE — Progress Notes (Signed)
Cardiology Office Note    Date:  02/21/2017   ID:  Calvin Limbo Sr., DOB 04-Sep-1955, MRN [id number]  PCP:  [name], MD  Cardiologist:  [name] discussed with DOD Dr. Royann Williams  Chief Complaint  Patient presents with  . New Patient (Initial Visit)    seen with DOD Dr. Royann Williams    History of Present Illness:  Calvin Breden Sr. is a 61 y.o. male with DM 2, hypertension, hyperlipidemia, secondary hyperparathyroidism, history of Graves' disease. Patient presented to his primary care provider's office on 01/23/2017 with episode of chest pain. EKG showed incomplete right bundle branch block. Recent hemoglobin A1c obtained on 01/23/2017 was 7.2. Outpatient stress test obtained on 01/27/2017 showed EF 50%, T wave inversion in lead 1 and aVL during stress, defect present in basal inferolateral, mid inferolateral and apical lateral location consistent with prior MI with peri-infarct ischemia. Overall it is considered a low risk study. Patient was referred to cardiology service for further evaluation.  According to the patient, 6 weeks ago, he had a right-sided chest pain similar in characteristics to his usual acid reflux symptom. The only differences, this time it lasted for majority of Friday and extended all the way through Saturday before resolving. He did mention this to his primary care physician a few weeks later. Stress test obtained subsequently came back abnormal showing an area of infarct. Interestingly, he never had any symptom like that prior to the event and since the event, he denies any chest discomfort. Last time he exercised was 2 weeks ago. He was able to do 20 minutes of treadmill followed by 20 minutes of bicycle in the gym without any exertional chest discomfort or shortness breath. He does not appears to be volume overloaded. He denies any lower extremity edema, orthopnea or PND. I have discussed the case with DOD Dr. Royann Williams, we will add PRN sublingual nitroglycerin, we will also  add metoprolol 12.5 mg twice a day. He will receive of 300 mg loading dose of Plavix followed by 75 mg daily of Plavix. He will require echocardiogram and a follow-up in 3 month with Dr. Royann Williams. Prior to his follow-up, he will need a repeat fasting lipid panel and LFTs.   Past Medical History:  Diagnosis Date  . Decreased hearing    Left  . Diabetes mellitus 2009   adult onset  . Glomerulonephritis 12/2008   bx provem membranous GN, sees renal q year  . Grave's disease    reportedly, history of i nthe past u/s 6/11 slightly enlarged gland w/o nodules  . H/O: hematuria    saw urology 3/09, neg w/u  . Hyperlipemia   . Hypertension   . Secondary hyperparathyroidism, renal (HCC)    Dr. [name]  . Urinary retention 01/2009   was seen at the ER and followup by urology    Past Surgical History:  Procedure Laterality Date  . right knee surgery     around 2015    Current Medications: Outpatient Medications Prior to Visit  Medication Sig Dispense Refill  . amLODipine (NORVASC) 5 MG tablet Take 1 tablet (5 mg total) by mouth daily. 30 tablet 6  . aspirin 81 MG tablet Take 81 mg by mouth daily. Reported on 08/31/2015    . atorvastatin (LIPITOR) 80 MG tablet Take 1 tablet (80 mg total) by mouth daily. 30 tablet 6  . ezetimibe (ZETIA) 10 MG tablet Take 1 tablet (10 mg total) by mouth daily. 30 tablet 6  . glimepiride (AMARYL) 2  MG tablet Take 1 tablet (2 mg total) by mouth daily with breakfast. 30 tablet 5  . glucose blood (FREESTYLE INSULINX TEST) test strip Test blood sugar once daily. Dx code: E11.9 100 each 12  . Lancets (FREESTYLE) lancets Check blood sugar once daily 100 each 12  . losartan (COZAAR) 100 MG tablet Take 1 tablet (100 mg total) by mouth daily. 30 tablet 3  . metFORMIN (GLUCOPHAGE) 500 MG tablet Take 1 tablet (500 mg total) by mouth 2 (two) times daily with a meal. 60 tablet 6   No facility-administered medications prior to visit.      Allergies:   Penicillins   Social  History   Social History  . Marital status: Married    Spouse name: N/A  . Number of children: 3  . Years of education: N/A   Occupational History  . welder    Social History Main Topics  . Smoking status: Former Smoker    Packs/day: 1.00    Quit date: 04/17/1989  . Smokeless tobacco: Never Used  . Alcohol use No  . Drug use: No  . Sexual activity: Not Asked   Other Topics Concern  . None   Social History Narrative   Original from [place], moved to [place] in the 80s , lives w/ wife     Family History:  The patient's family history includes Diabetes in his maternal grandmother and other; Healthy in his mother; Hypertension in his maternal grandmother.   ROS:   Please see the history of present illness.    ROS All other systems reviewed and are negative.   PHYSICAL EXAM:   VS:  BP (!) 141/90 (BP Location: Left Arm, Cuff Size: Normal)   Pulse 73   Ht 6\' 1"  (1.854 m)   Wt 229 lb 9.6 oz (104.1 kg)   BMI 30.29 kg/m    GEN: Well nourished, well developed, in no acute distress  HEENT: normal  Neck: no JVD, carotid bruits, or masses Cardiac: RRR; no murmurs, rubs, or gallops,no edema  Respiratory:  clear to auscultation bilaterally, normal work of breathing GI: soft, nontender, nondistended, + BS MS: no deformity or atrophy  Skin: warm and dry, no rash Neuro:  Alert and Oriented x 3, Strength and sensation are intact Psych: euthymic mood, full affect  Wt Readings from Last 3 Encounters:  02/20/17 229 lb 9.6 oz (104.1 kg)  01/23/17 234 lb 4 oz (106.3 kg)  06/20/16 233 lb 4 oz (105.8 kg)      Studies/Labs Reviewed:   EKG:  EKG is ordered today.  The ekg ordered today demonstrates Normal sinus rhythm, incomplete right bundle branch block, otherwise no significant ST-T wave changes  Recent Labs: 06/20/2016: ALT 23 01/23/2017: BUN 18; Creatinine, Ser 1.23; Hemoglobin 14.8; Platelets 207.0; Potassium 3.7; Sodium 137   Lipid Panel    Component Value Date/Time   CHOL  183 06/20/2016 0822   TRIG 97.0 06/20/2016 0822   HDL 51.40 06/20/2016 0822   CHOLHDL 4 06/20/2016 0822   VLDL 19.4 06/20/2016 0822   LDLCALC 112 (H) 06/20/2016 0822   LDLDIRECT 118.7 12/27/2010 0816    Additional studies/ records that were reviewed today include:   Myoview 01/27/2017 Study Highlights     Nuclear stress EF: 50%.  There was no ST segment deviation noted during stress.  T wave inversion was noted during stress in the I and aVL leads.  Defect 1: There is a defect present in the basal inferolateral, mid inferolateral and apical lateral  location.  Findings consistent with prior myocardial infarction with peri-infarct ischemia.  This is a low risk study.   Low risk nuclear study with evidence of a mostly fixed inferolateral defect consistent with infarction in the distribution of a posterolateral coronary artery. There appears to be a moderate amount of reversible ischemia in this distribution.       ASSESSMENT:    1. Coronary artery disease involving native coronary artery of native heart without angina pectoris   2. Encounter for long-term (current) use of medications   3. Abnormal stress test   4. Essential hypertension   5. Hyperlipidemia, unspecified hyperlipidemia type   6. Controlled type 2 diabetes mellitus without complication, without long-term current use of insulin (HCC)      PLAN:  In order of problems listed above:  1. Abnormal Myoview: He had episode of prolonged chest pain 6 weeks ago, no further chest pain since even with exercise. Myoview showed an area of infarct. He likely suffered an out of hospital MI. We will prescribe him sublingual nitroglycerin to take on an as needed basis, had metoprolol 12.5 mg twice a day. Started on Plavix 75 mg daily with 300 mg loading dose.  2. Hypertension: Add metoprolol 12.5 mg twice a day.  3. Hyperlipidemia: On Lipitor 80 mg daily. Fasting lipid panel and LFTs in 3 month  4. DM 2: on  Metformin    Medication Adjustments/Labs and Tests Ordered: Current medicines are reviewed at length with the patient today.  Concerns regarding medicines are outlined above.  Medication changes, Labs and Tests ordered today are listed in the Patient Instructions below. Patient Instructions  Medication Instructions:   START these new medications.  -Nitroglycerin 0.4mg  (sublingual tablet). You may take this as needed for chest pain symptoms. This medication is placed under the tongue until it dissolves. Take up to 3 tablets spaced 5 minutes apart for relief of chest pain. If you do not have relief of symptoms after 3rd dose call 911.  -Metoprolol Tartrate 12.5mg  TWICE DAILY. The tablet size is 25mg . Please cut these tablets in half and take 1/2 in AM, 1/2 in PM approximately 12 hours apart.  -Clopidogrel (Plavix). The DAILY dose is 75mg  (1 tablet). The first day you take this medication, you will need to take a loading dose of 300mg  (4 tablets).   Labwork:   Your physician recommends that you return for fasting cholesterol lab work in: 2-3 months (before your next appointment)  Testing/Procedures:  Your physician has requested that you have an echocardiogram. Echocardiography is a painless test that uses sound waves to create images of your heart. It provides your doctor with information about the size and shape of your heart and how well your heart's chambers and valves are working. This procedure takes approximately one hour. There are no restrictions for this procedure.    Follow-Up:  3 months with Dr. Royann Williams (after tests)   If you need a refill on your cardiac medications before your next appointment, please call your pharmacy.  Call if you have any questions, concerns, or further needs at any time.     Ramond Dial, [place]  02/21/2017 10:29 PM    [facility] [address] [place], [place], [place]  [address] Phone: [phone]; Fax: [phone]

## 2017-02-21 ENCOUNTER — Encounter: Payer: Self-pay | Admitting: Physician Assistant

## 2017-03-05 ENCOUNTER — Other Ambulatory Visit: Payer: Self-pay

## 2017-03-05 ENCOUNTER — Ambulatory Visit (HOSPITAL_COMMUNITY): Payer: 59 | Attending: Cardiovascular Disease

## 2017-03-05 DIAGNOSIS — I5030 Unspecified diastolic (congestive) heart failure: Secondary | ICD-10-CM | POA: Diagnosis not present

## 2017-03-05 DIAGNOSIS — I2510 Atherosclerotic heart disease of native coronary artery without angina pectoris: Secondary | ICD-10-CM

## 2017-03-06 NOTE — Progress Notes (Signed)
Good news, even with recent abnormal stress test, the overall pumping function remain very much normal. Only abnormality is the severely thickened heart muscle, most likely cause is high pressure. Will need to control BP better

## 2017-03-10 ENCOUNTER — Telehealth: Payer: Self-pay | Admitting: Physician Assistant

## 2017-03-10 NOTE — Telephone Encounter (Signed)
Calvin Williams is calling to get his  Echo results.. Please cal

## 2017-03-10 NOTE — Telephone Encounter (Signed)
Re: echo results --- I have discussed w patient, who voiced understanding and thanks.

## 2017-05-01 ENCOUNTER — Encounter: Payer: Self-pay | Admitting: Internal Medicine

## 2017-05-01 ENCOUNTER — Ambulatory Visit (INDEPENDENT_AMBULATORY_CARE_PROVIDER_SITE_OTHER): Payer: 59 | Admitting: Internal Medicine

## 2017-05-01 VITALS — BP 132/68 | HR 63 | Temp 98.2°F | Resp 14 | Ht 73.0 in | Wt 222.4 lb

## 2017-05-01 DIAGNOSIS — I10 Essential (primary) hypertension: Secondary | ICD-10-CM | POA: Diagnosis not present

## 2017-05-01 DIAGNOSIS — I2510 Atherosclerotic heart disease of native coronary artery without angina pectoris: Secondary | ICD-10-CM | POA: Diagnosis not present

## 2017-05-01 DIAGNOSIS — Z23 Encounter for immunization: Secondary | ICD-10-CM

## 2017-05-01 DIAGNOSIS — E118 Type 2 diabetes mellitus with unspecified complications: Secondary | ICD-10-CM

## 2017-05-01 DIAGNOSIS — E785 Hyperlipidemia, unspecified: Secondary | ICD-10-CM

## 2017-05-01 LAB — LIPID PANEL
Cholesterol: 149 mg/dL (ref 0–200)
HDL: 42.3 mg/dL (ref >39.00)
LDL Cholesterol: 76 mg/dL (ref 0–99)
NonHDL: 106.63
Total CHOL/HDL Ratio: 4
Triglycerides: 154 mg/dL — ABNORMAL HIGH (ref 0.0–149.0)
VLDL: 30.8 mg/dL (ref 0.0–40.0)

## 2017-05-01 LAB — COMPREHENSIVE METABOLIC PANEL
ALT: 26 U/L (ref 0–53)
AST: 22 U/L (ref 0–37)
Albumin: 3.9 g/dL (ref 3.5–5.2)
Alkaline Phosphatase: 93 U/L (ref 39–117)
BUN: 20 mg/dL (ref 6–23)
CO2: 31 mEq/L (ref 19–32)
Calcium: 9.4 mg/dL (ref 8.4–10.5)
Chloride: 103 mEq/L (ref 96–112)
Creatinine, Ser: 1.1 mg/dL (ref 0.40–1.50)
GFR: 71.93 mL/min (ref >60.00)
Glucose, Bld: 122 mg/dL — ABNORMAL HIGH (ref 70–99)
Potassium: 3.8 mEq/L (ref 3.5–5.1)
Sodium: 139 mEq/L (ref 135–145)
Total Bilirubin: 1.1 mg/dL (ref 0.2–1.2)
Total Protein: 7 g/dL (ref 6.0–8.3)

## 2017-05-01 LAB — HEMOGLOBIN A1C: Hgb A1c MFr Bld: 6.3 % (ref 4.6–6.5)

## 2017-05-01 MED ORDER — AMLODIPINE BESYLATE 5 MG PO TABS: 5.0000 mg | ORAL_TABLET | Freq: Every day | ORAL | 6 refills | Status: DC

## 2017-05-01 MED ORDER — ATORVASTATIN CALCIUM 80 MG PO TABS: 80.0000 mg | ORAL_TABLET | Freq: Every day | ORAL | 6 refills | Status: DC

## 2017-05-01 MED ORDER — LOSARTAN POTASSIUM 100 MG PO TABS: 100.0000 mg | ORAL_TABLET | Freq: Every day | ORAL | 6 refills | Status: DC

## 2017-05-01 MED ORDER — METFORMIN HCL 500 MG PO TABS: 500.0000 mg | ORAL_TABLET | Freq: Two times a day (BID) | ORAL | 6 refills | Status: DC

## 2017-05-01 NOTE — Patient Instructions (Signed)
GO TO THE LAB : Get the blood work     GO TO THE FRONT DESK Schedule your next appointment for a routine checkup in 3 months    Check the  blood pressure weekly  Be sure your blood pressure is between 110/65 and  145/85. If it is consistently higher or lower, let me know

## 2017-05-01 NOTE — Progress Notes (Signed)
Pre visit review using our clinic review tool, if applicable. No additional management support is needed unless otherwise documented below in the visit note. 

## 2017-05-01 NOTE — Progress Notes (Signed)
Subjective:    Patient ID: Calvin Larkey Sr., male    DOB: [birth year], 61 y.o.   MRN: [id number]  DOS:  05/01/2017 Type of visit - description : rov Interval history: CAD: Chart reviewed. No further chest pain. Has increased his physical activity and feeling very well. DM: Good med compliance, has changed his diet, lost some weight, CBGs very good High cholesterol: Was prescribed Zetia 01-2017, self DC'd after a week.  Wt Readings from Last 3 Encounters:  05/01/17 222 lb 6 oz (100.9 kg)  02/20/17 229 lb 9.6 oz (104.1 kg)  01/23/17 234 lb 4 oz (106.3 kg)     Review of Systems  No chest pain or difficulty breathing No nausea, vomiting, blood in the stools. No gross hematuria.  Past Medical History:  Diagnosis Date  . Decreased hearing    Left  . Diabetes mellitus 2009   adult onset  . Glomerulonephritis 12/2008   bx provem membranous GN, sees renal q year  . Grave's disease    reportedly, history of i nthe past u/s 6/11 slightly enlarged gland w/o nodules  . H/O: hematuria    saw urology 3/09, neg w/u  . Hyperlipemia   . Hypertension   . Secondary hyperparathyroidism, renal (HCC)    Dr. [name]  . Urinary retention 01/2009   was seen at the ER and followup by urology    Past Surgical History:  Procedure Laterality Date  . right knee surgery     around 2015    Social History   Social History  . Marital status: Married    Spouse name: N/A  . Number of children: 3  . Years of education: N/A   Occupational History  . welder    Social History Main Topics  . Smoking status: Former Smoker    Packs/day: 1.00    Quit date: 04/17/1989  . Smokeless tobacco: Never Used  . Alcohol use No  . Drug use: No  . Sexual activity: Not on file   Other Topics Concern  . Not on file   Social History Narrative   Original from [place], moved to [place] in the 80s , lives w/ wife      Allergies as of 05/01/2017      Reactions   Penicillins    Unsure if this drug was the  cause of rash      Medication List       Accurate as of 05/01/17 11:59 PM. Always use your most recent med list.          amLODipine 5 MG tablet Commonly known as:  NORVASC Take 1 tablet (5 mg total) by mouth daily.   aspirin 81 MG tablet Take 81 mg by mouth daily. Reported on 08/31/2015   atorvastatin 80 MG tablet Commonly known as:  LIPITOR Take 1 tablet (80 mg total) by mouth daily.   clopidogrel 75 MG tablet Commonly known as:  PLAVIX Take 4 tablets (total of 300mg ) the 1st day you take this medication. Take 1 tablet (75mg ) by mouth daily after initial dose.   freestyle lancets Check blood sugar once daily   glimepiride 2 MG tablet Commonly known as:  AMARYL Take 1 tablet (2 mg total) by mouth daily with breakfast.   glucose blood test strip Commonly known as:  FREESTYLE INSULINX TEST Test blood sugar once daily. Dx code: E11.9   losartan 100 MG tablet Commonly known as:  COZAAR Take 1 tablet (100 mg total) by mouth daily.  metFORMIN 500 MG tablet Commonly known as:  GLUCOPHAGE Take 1 tablet (500 mg total) by mouth 2 (two) times daily with a meal.   metoprolol tartrate 25 MG tablet Commonly known as:  LOPRESSOR Take 0.5 tablets (12.5 mg total) by mouth 2 (two) times daily.   nitroGLYCERIN 0.4 MG SL tablet Commonly known as:  NITROSTAT Place 1 tablet (0.4 mg total) under the tongue every 5 (five) minutes as needed for chest pain.          Objective:   Physical Exam BP 132/68 (BP Location: Left Arm, Patient Position: Sitting, Cuff Size: Normal)   Pulse 63   Temp 98.2 F (36.8 C) (Oral)   Resp 14   Ht 6\' 1"  (1.854 m)   Wt 222 lb 6 oz (100.9 kg)   SpO2 96%   BMI 29.34 kg/m  General:   Well developed, well nourished . NAD.  HEENT:  Normocephalic . Face symmetric, atraumatic Lungs:  CTA B Normal respiratory effort, no intercostal retractions, no accessory muscle use. Heart: RRR,  no murmur.  No pretibial edema bilaterally  Skin: Not pale.  Not jaundice Neurologic:  alert & oriented X3.  Speech normal, gait appropriate for age and unassisted Psych--  Cognition and judgment appear intact.  Cooperative with normal attention span and concentration.  Behavior appropriate. No anxious or depressed appearing.      Assessment & Plan:   Assessment DM 2009 HTN Hyperlipidemia CAD 01/2017: had CP,  stress test show an area of fixed defect, saw cardiology, likely s/p a out-of-hospital MI at the time of CP. Rx Plavix /metoprolol  echo 02/2017 nl EF Renal: Dr Hyman HopesWebb ---Glomerulonephritis 2010, BX proven ---Secondary hyperparathyroidism HOH L GU H/o Hematuria, urology w/u 09/2007 H/o Urinary retention 2010  PLAN: DM: Currently on glimepiride and metformin. Has definitely improved his lifestyle. Last A1c was > 7, recheck today. HTN: Currently on losartan, recently cardiology added metoprolol. I rx  Amlodipine 01/2017,  for one month, then he ran out and did not renew his prescription. Recommend to stay on amlodipine as he needs even better BP control. Check a CMP. CAD: See last OV, had CP, stress test show an area of fixed defect, saw cardiology, felt he probably had a out-of-hospital MI at the time of CP. They added Plavix and metoprolol for better BP control. Good compliance with both, no apparent side effects. No further CP. High cholesterol: Good compliance with Lipitor, Zetia was added and 01-2012, took it for one week, felt "anxious" and self DC. Check a FLP, suspect it will better because his lifestyle has improved. RTC 3 months  7----------- DM: On glimepiride, last A1c 6.8. Recheck A1c. HTN: Last BMP at  nephrology  satisfactory, BPs in the ambulatory setting in the 140s, today: 150/94, recheck 146/90. Currently on losartan. Needs better control. Check a BMP, add amlodipine 5 mg Chest pain: As described above, lasted one day, not exertional. Patient has diabetes. EKG today: Sinus rhythm, incomplete RBBB. Given risk factors will  get a stress test. Check a CBC Glomerulonephritis: Next visit w/ renal 11-2017. (Patient understood to RTC prn) RTC 3 months.

## 2017-05-03 DIAGNOSIS — I2510 Atherosclerotic heart disease of native coronary artery without angina pectoris: Secondary | ICD-10-CM | POA: Insufficient documentation

## 2017-05-03 NOTE — Assessment & Plan Note (Signed)
DM: Currently on glimepiride and metformin. Has definitely improved his lifestyle. Last A1c was > 7, recheck today. HTN: Currently on losartan, recently cardiology added metoprolol. I rx  Amlodipine 01/2017,  for one month, then he ran out and did not renew his prescription. Recommend to stay on amlodipine as he needs even better BP control. Check a CMP. CAD: See last OV, had CP, stress test show an area of fixed defect, saw cardiology, felt he probably had a out-of-hospital MI at the time of CP. They added Plavix and metoprolol for better BP control. Good compliance with both, no apparent side effects. No further CP. High cholesterol: Good compliance with Lipitor, Zetia was added and 01-2012, took it for one week, felt "anxious" and self DC. Check a FLP, suspect it will better because his lifestyle has improved. RTC 3 months

## 2017-05-22 ENCOUNTER — Ambulatory Visit: Payer: 59 | Admitting: Cardiovascular Disease

## 2017-05-22 ENCOUNTER — Encounter: Payer: Self-pay | Admitting: Cardiovascular Disease

## 2017-05-22 VITALS — BP 129/80 | HR 58 | Ht 73.0 in | Wt 222.6 lb

## 2017-05-22 DIAGNOSIS — I10 Essential (primary) hypertension: Secondary | ICD-10-CM | POA: Diagnosis not present

## 2017-05-22 DIAGNOSIS — E782 Mixed hyperlipidemia: Secondary | ICD-10-CM | POA: Diagnosis not present

## 2017-05-22 DIAGNOSIS — E118 Type 2 diabetes mellitus with unspecified complications: Secondary | ICD-10-CM | POA: Diagnosis not present

## 2017-05-22 DIAGNOSIS — I25118 Atherosclerotic heart disease of native coronary artery with other forms of angina pectoris: Secondary | ICD-10-CM | POA: Diagnosis not present

## 2017-05-22 DIAGNOSIS — E663 Overweight: Secondary | ICD-10-CM

## 2017-05-22 NOTE — Progress Notes (Signed)
Cardiology Office Note:    Date:  05/22/2017   ID:  Calvin LimboJorge Engebretsen Sr., DOB 1955-10-23, MRN [id number]  PCP:  Wanda PlumpPaz, Jose E, MD  Cardiologist:  Thurmon FairMihai Deshanae Lindo, MD    Referring MD: Wanda PlumpPaz, Jose E, MD   Chief Complaint  Patient presents with  . Follow-up    pt denied chest pain and SOB  Abnormal nuclear scan  History of Present Illness:    Calvin LimboJorge Champlain Sr. is a 61 y.o. male with a hx of diabetes mellitus, hyperlipidemia, hypertension, overweight, with an episode of chest discomfort earlier this year that might have represented a myocardial infarction.    His nuclear stress test showed a fixed perfusion defect in the basal lateral wall, suggesting a possible infarction in the distribution of a branch of the left circumflex coronary artery.  There is no meaningful reversible ischemia and left ventricular systolic function is normal he has not undergone coronary angiography.  His echo showed normal left ventricular systolic function and "severe" left ventricular hypertrophy with a relaxation abnormality, but without elevation in filling pressures.  The left atrium was mildly dilated  He has not had any recurrence of the chest discomfort he had over the summer.  He exercises at the gym several times a week using the stationary bicycle and treadmill.  He denies any exertional dyspnea or angina.  He has not had palpitations, dizziness, syncope, leg edema or intermittent claudication and denies erectile dysfunction.  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  He has occasional episodes of "nervousness" that may represent hypoglycemia.  Labs performed just a couple of weeks ago show marked improvement in his glycemic control with a hemoglobin A1c of 6.3%.  His lipid profile also shows improvement with an LDL cholesterol that is down to 76 and borderline triglycerides at 154.  Past Medical History:  Diagnosis Date  . Decreased hearing    Left  . Diabetes mellitus 2009   adult onset    . Glomerulonephritis 12/2008   bx provem membranous GN, sees renal q year  . Grave's disease    reportedly, history of i nthe past u/s 6/11 slightly enlarged gland w/o nodules  . H/O: hematuria    saw urology 3/09, neg w/u  . Hyperlipemia   . Hypertension   . Secondary hyperparathyroidism, renal (HCC)    Dr. Hyman HopesWebb  . Urinary retention 01/2009   was seen at the ER and followup by urology    Past Surgical History:  Procedure Laterality Date  . right knee surgery     around 2015    Current Medications: Current Meds  Medication Sig  . amLODipine (NORVASC) 5 MG tablet Take 1 tablet (5 mg total) by mouth daily.  Marland Kitchen. aspirin 81 MG tablet Take 81 mg by mouth daily. Reported on 08/31/2015  . atorvastatin (LIPITOR) 80 MG tablet Take 1 tablet (80 mg total) by mouth daily.  . clopidogrel (PLAVIX) 75 MG tablet Take 4 tablets (total of 300mg ) the 1st day you take this medication. Take 1 tablet (75mg ) by mouth daily after initial dose.  Marland Kitchen. glimepiride (AMARYL) 2 MG tablet Take 1 tablet (2 mg total) by mouth daily with breakfast.  . glucose blood (FREESTYLE INSULINX TEST) test strip Test blood sugar once daily. Dx code: E11.9  . Lancets (FREESTYLE) lancets Check blood sugar once daily  . losartan (COZAAR) 100 MG tablet Take 1 tablet (100 mg total) by mouth daily.  . metFORMIN (GLUCOPHAGE) 500 MG tablet Take 1 tablet (500  mg total) by mouth 2 (two) times daily with a meal.     Allergies:   Penicillins   Social History   Socioeconomic History  . Marital status: Married    Spouse name: None  . Number of children: 3  . Years of education: None  . Highest education level: None  Social Needs  . Financial resource strain: None  . Food insecurity - worry: None  . Food insecurity - inability: None  . Transportation needs - medical: None  . Transportation needs - non-medical: None  Occupational History  . Occupation: welder  Tobacco Use  . Smoking status: Former Smoker    Packs/day: 1.00     Last attempt to quit: 04/17/1989    Years since quitting: 28.1  . Smokeless tobacco: Never Used  Substance and Sexual Activity  . Alcohol use: No  . Drug use: No  . Sexual activity: None  Other Topics Concern  . None  Social History Narrative   Original from [place], moved to [place] in the 80s , lives w/ wife     Family History: The patient's family history includes Diabetes in his maternal grandmother and other; Healthy in his mother; Hypertension in his maternal grandmother. There is no history of Heart attack, Colon cancer, or Prostate cancer. ROS:   Please see the history of present illness.     All other systems reviewed and are negative.  EKGs/Labs/Other Studies Reviewed:    The following studies were reviewed today: Nuclear stress test images, echocardiogram  EKG:  EKG is not ordered today.    Recent Labs: 01/23/2017: Hemoglobin 14.8; Platelets 207.0 05/01/2017: ALT 26; BUN 20; Creatinine, Ser 1.10; Potassium 3.8; Sodium 139  Recent Lipid Panel    Component Value Date/Time   CHOL 149 05/01/2017 0909   TRIG 154.0 (H) 05/01/2017 0909   HDL 42.30 05/01/2017 0909   CHOLHDL 4 05/01/2017 0909   VLDL 30.8 05/01/2017 0909   LDLCALC 76 05/01/2017 0909   LDLDIRECT 118.7 12/27/2010 0816    Physical Exam:    VS:  BP 129/80   Pulse (!) 58   Ht 6\' 1"  (1.854 m)   Wt 222 lb 9.6 oz (101 kg)   SpO2 97%   BMI 29.37 kg/m     Wt Readings from Last 3 Encounters:  05/22/17 222 lb 9.6 oz (101 kg)  05/01/17 222 lb 6 oz (100.9 kg)  02/20/17 229 lb 9.6 oz (104.1 kg)     GEN:  Well nourished, well developed in no acute distress, overweight HEENT: Normal NECK: No JVD; No carotid bruits LYMPHATICS: No lymphadenopathy CARDIAC: RRR, no murmurs, rubs, gallops RESPIRATORY:  Clear to auscultation without rales, wheezing or rhonchi  ABDOMEN: Soft, non-tender, non-distended MUSCULOSKELETAL:  No edema; No deformity  SKIN: Warm and dry NEUROLOGIC:  Alert and oriented x 3 PSYCHIATRIC:   Normal affect   ASSESSMENT:    1. Coronary artery disease involving native coronary artery of native heart with other form of angina pectoris (HCC)   2. Essential hypertension   3. Mixed hyperlipidemia   4. Controlled type 2 diabetes mellitus with complication, without long-term current use of insulin (HCC)   5. Overweight (BMI 25.0-29.9)    PLAN:    In order of problems listed above:  1. CAD: The evidence overwhelmingly suggest that he had a small myocardial infarction in the basal lateral wall that is complete.  He does not have any exertional angina and there was no reversibility on his nuclear stress test.  Left ventricular systolic function is preserved.  Continue conservative management.  Plan clopidogrel for a total of 12 months, then low-dose aspirin lifelong.  He is on a beta-blocker and angiotensin receptor blocker.  Encouraged him to continue regular exercise.  Discussed the difference between stable angina and unstable angina symptoms.  Told him that he should seek urgent medical attention for chest discomfort lasting more than 30 minutes. 2. HTN: Target 130/80 or less, at target today on current medications. 3. HLP: LDL cholesterol is close to target of less than 70, I do not think adding ezetimibe is necessary at this point, but encouraged him to try to lose more weight. 4. DM: Excellent glycemic control, I wonder if he is having some symptoms of hypoglycemia when he has "nervousness". 5. Overweight: Try to lose down to a waistline of 34 inches and BMI of 25 or less.  I suspect if he is successful in this he may be able to discontinue his sulfonylurea and stay only on metformin.   Medication Adjustments/Labs and Tests Ordered: Current medicines are reviewed at length with the patient today.  Concerns regarding medicines are outlined above.  No orders of the defined types were placed in this encounter.  No orders of the defined types were placed in this  encounter.   Signed, Thurmon FairMihai Laurieanne Galloway, MD  05/22/2017 9:37 AM    [facility]

## 2017-05-22 NOTE — Patient Instructions (Signed)
Dr Croitoru recommends that you schedule a follow-up appointment in 6 months. You will receive a reminder letter in the mail two months in advance. If you don't receive a letter, please call our office to schedule the follow-up appointment.  If you need a refill on your cardiac medications before your next appointment, please call your pharmacy. 

## 2017-07-31 ENCOUNTER — Ambulatory Visit (INDEPENDENT_AMBULATORY_CARE_PROVIDER_SITE_OTHER): Payer: 59 | Admitting: Internal Medicine

## 2017-07-31 ENCOUNTER — Encounter: Payer: Self-pay | Admitting: Internal Medicine

## 2017-07-31 VITALS — BP 124/70 | HR 70 | Temp 98.4°F | Resp 14 | Ht 73.0 in | Wt 227.0 lb

## 2017-07-31 DIAGNOSIS — E118 Type 2 diabetes mellitus with unspecified complications: Secondary | ICD-10-CM

## 2017-07-31 LAB — HEMOGLOBIN A1C: Hgb A1c MFr Bld: 6.8 % — ABNORMAL HIGH (ref 4.6–6.5)

## 2017-07-31 NOTE — Progress Notes (Signed)
Pre visit review using our clinic review tool, if applicable. No additional management support is needed unless otherwise documented below in the visit note. 

## 2017-07-31 NOTE — Patient Instructions (Signed)
GO TO THE LAB : Get the blood work     GO TO THE FRONT DESK Schedule your next appointment for a   checkup in 3 months  Stop glimepiride  Diabetes: Check your blood sugar  once a day  at different times of the day  GOALS: Fasting before a meal 70- 130 2 hours after a meal less than 180

## 2017-07-31 NOTE — Progress Notes (Signed)
Subjective:    Patient ID: Calvin Brau Sr., male    DOB: July 25, 1955, 62 y.o.   MRN: [id number]  DOS:  07/31/2017 Type of visit - description : rov Interval history: DM: Continue doing okay with lifestyle, compliant with medicines, ambulatory CBGs 109, 96.  Sometimes get "nervous" 2 hours after breakfast  when he is at work.  No CBGs at the time.  No other symptoms.  See ROS CAD: Cardiology note reviewed   Wt Readings from Last 3 Encounters:  07/31/17 227 lb (103 kg)  05/22/17 222 lb 9.6 oz (101 kg)  05/01/17 222 lb 6 oz (100.9 kg)     Review of Systems No  chest pain or difficulty breathing No sweats or nausea  Past Medical History:  Diagnosis Date  . Decreased hearing    Left  . Diabetes mellitus 2009   adult onset  . Glomerulonephritis 12/2008   bx provem membranous GN, sees renal q year  . Grave's disease    reportedly, history of i nthe past u/s 6/11 slightly enlarged gland w/o nodules  . H/O: hematuria    saw urology 3/09, neg w/u  . Hyperlipemia   . Hypertension   . Secondary hyperparathyroidism, renal (HCC)    Dr. [name]  . Urinary retention 01/2009   was seen at the ER and followup by urology    Past Surgical History:  Procedure Laterality Date  . right knee surgery     around 2015    Social History   Socioeconomic History  . Marital status: Married    Spouse name: Not on file  . Number of children: 3  . Years of education: Not on file  . Highest education level: Not on file  Social Needs  . Financial resource strain: Not on file  . Food insecurity - worry: Not on file  . Food insecurity - inability: Not on file  . Transportation needs - medical: Not on file  . Transportation needs - non-medical: Not on file  Occupational History  . Occupation: welder  Tobacco Use  . Smoking status: Former Smoker    Packs/day: 1.00    Last attempt to quit: 04/17/1989    Years since quitting: 28.3  . Smokeless tobacco: Never Used  Substance and Sexual  Activity  . Alcohol use: No  . Drug use: No  . Sexual activity: Not on file  Other Topics Concern  . Not on file  Social History Narrative   Original from [place], moved to [place] in the 80s , lives w/ wife      Allergies as of 07/31/2017      Reactions   Penicillins    Unsure if this drug was the cause of rash      Medication List        Accurate as of 07/31/17 11:59 PM. Always use your most recent med list.          amLODipine 5 MG tablet Commonly known as:  NORVASC Take 1 tablet (5 mg total) by mouth daily.   aspirin 81 MG tablet Take 81 mg by mouth daily. Reported on 08/31/2015   atorvastatin 80 MG tablet Commonly known as:  LIPITOR Take 1 tablet (80 mg total) by mouth daily.   clopidogrel 75 MG tablet Commonly known as:  PLAVIX Take 4 tablets (total of 300mg ) the 1st day you take this medication. Take 1 tablet (75mg ) by mouth daily after initial dose.   freestyle lancets Check blood sugar once daily  glucose blood test strip Commonly known as:  FREESTYLE INSULINX TEST Test blood sugar once daily. Dx code: E11.9   losartan 100 MG tablet Commonly known as:  COZAAR Take 1 tablet (100 mg total) by mouth daily.   metFORMIN 500 MG tablet Commonly known as:  GLUCOPHAGE Take 1 tablet (500 mg total) by mouth 2 (two) times daily with a meal.   metoprolol tartrate 25 MG tablet Commonly known as:  LOPRESSOR Take 0.5 tablets (12.5 mg total) by mouth 2 (two) times daily.   nitroGLYCERIN 0.4 MG SL tablet Commonly known as:  NITROSTAT Place 1 tablet (0.4 mg total) under the tongue every 5 (five) minutes as needed for chest pain.          Objective:   Physical Exam BP 124/70 (BP Location: Left Arm, Patient Position: Sitting, Cuff Size: Normal)   Pulse 70   Temp 98.4 F (36.9 C) (Oral)   Resp 14   Ht 6\' 1"  (1.854 m)   Wt 227 lb (103 kg)   SpO2 97%   BMI 29.95 kg/m  General:   Well developed, well nourished . NAD.  HEENT:  Normocephalic . Face symmetric,  atraumatic Lungs:  CTA B Normal respiratory effort, no intercostal retractions, no accessory muscle use. Heart: RRR,  no murmur.  No pretibial edema bilaterally  Skin: Not pale. Not jaundice Neurologic:  alert & oriented X3.  Speech normal, gait appropriate for age and unassisted Psych--  Cognition and judgment appear intact.  Cooperative with normal attention span and concentration.  Behavior appropriate. No anxious or depressed appearing.      Assessment & Plan:   Assessment DM 2009 HTN Hyperlipidemia CAD 01/2017: had CP,  stress test show an area of fixed defect, saw cardiology, likely s/p a out-of-hospital MI at the time of CP. Rx Plavix /metoprolol  echo 02/2017 nl EF Renal: Dr Hyman HopesWebb ---Glomerulonephritis 2010, BX proven ---Secondary hyperparathyroidism HOH L GU H/o Hematuria, urology w/u 09/2007 H/o Urinary retention 2010  PLAN: DM: Currently on glimepiride and metformin 500 mg twice a day.  Doing well with lifestyle although has gained some weight.  Encouraged to redouble his efforts to eat better and stay active.  Occasionally gets nervous, hypoglycemia? Plan: Check a A1c, stop glimepiride, depending on results consider increase metformin. CAD: saw cards 05/2017, felt to be stable, they rec to continue aspirin and stay on Plavix x 12 months. RTC 3 months

## 2017-08-01 NOTE — Assessment & Plan Note (Signed)
DM: Currently on glimepiride and metformin 500 mg twice a day.  Doing well with lifestyle although has gained some weight.  Encouraged to redouble his efforts to eat better and stay active.  Occasionally gets nervous, hypoglycemia? Plan: Check a A1c, stop glimepiride, depending on results consider increase metformin. CAD: saw cards 05/2017, felt to be stable, they rec to continue aspirin and stay on Plavix x 12 months. RTC 3 months

## 2017-08-03 ENCOUNTER — Other Ambulatory Visit: Payer: Self-pay

## 2017-08-03 DIAGNOSIS — E118 Type 2 diabetes mellitus with unspecified complications: Secondary | ICD-10-CM

## 2017-08-03 MED ORDER — METFORMIN HCL 850 MG PO TABS: 850.0000 mg | ORAL_TABLET | Freq: Two times a day (BID) | ORAL | 6 refills | Status: DC

## 2017-11-06 ENCOUNTER — Ambulatory Visit (INDEPENDENT_AMBULATORY_CARE_PROVIDER_SITE_OTHER): Payer: 59 | Admitting: Internal Medicine

## 2017-11-06 ENCOUNTER — Encounter: Payer: Self-pay | Admitting: Internal Medicine

## 2017-11-06 VITALS — BP 128/82 | HR 70 | Temp 98.5°F | Resp 14 | Ht 73.0 in | Wt 219.2 lb

## 2017-11-06 DIAGNOSIS — N052 Unspecified nephritic syndrome with diffuse membranous glomerulonephritis: Secondary | ICD-10-CM | POA: Diagnosis not present

## 2017-11-06 DIAGNOSIS — L97509 Non-pressure chronic ulcer of other part of unspecified foot with unspecified severity: Secondary | ICD-10-CM

## 2017-11-06 DIAGNOSIS — E213 Hyperparathyroidism, unspecified: Secondary | ICD-10-CM | POA: Diagnosis not present

## 2017-11-06 DIAGNOSIS — I10 Essential (primary) hypertension: Secondary | ICD-10-CM

## 2017-11-06 DIAGNOSIS — E11621 Type 2 diabetes mellitus with foot ulcer: Secondary | ICD-10-CM

## 2017-11-06 DIAGNOSIS — B351 Tinea unguium: Secondary | ICD-10-CM

## 2017-11-06 LAB — BASIC METABOLIC PANEL
BUN: 17 mg/dL (ref 6–23)
CO2: 29 mEq/L (ref 19–32)
Calcium: 9.1 mg/dL (ref 8.4–10.5)
Chloride: 103 mEq/L (ref 96–112)
Creatinine, Ser: 1.24 mg/dL (ref 0.40–1.50)
GFR: 62.54 mL/min (ref >60.00)
Glucose, Bld: 143 mg/dL — ABNORMAL HIGH (ref 70–99)
Potassium: 3.8 mEq/L (ref 3.5–5.1)
Sodium: 139 mEq/L (ref 135–145)

## 2017-11-06 LAB — MICROALBUMIN / CREATININE URINE RATIO
Creatinine,U: 97.2 mg/dL
Microalb Creat Ratio: 18.9 mg/g (ref 0.0–30.0)
Microalb, Ur: 18.3 mg/dL — ABNORMAL HIGH (ref 0.0–1.9)

## 2017-11-06 LAB — HEMOGLOBIN A1C: Hgb A1c MFr Bld: 7.8 % — ABNORMAL HIGH (ref 4.6–6.5)

## 2017-11-06 LAB — ALT: ALT: 23 U/L (ref 0–53)

## 2017-11-06 LAB — AST: AST: 22 U/L (ref 0–37)

## 2017-11-06 NOTE — Progress Notes (Signed)
Subjective:    Patient ID: Calvin Pautsch Sr., male    DOB: [birth year], 62 y.o.   MRN: [id number]  DOS:  11/06/2017 Type of visit - description : rov Interval history: DM: No major concerns.  Good compliance with medication, ambulatory CBGs in the 120s Allergies: Bothering him lately but no fever or chills. History of glomerulonephritis and hyper parathyroidism: Has not seen nephrology.  Review of Systems Denies chest pain, difficulty breathing or edema. No nausea, vomiting. No lower extremity paresthesias  Past Medical History:  Diagnosis Date  . Decreased hearing    Left  . Diabetes mellitus 2009   adult onset  . Glomerulonephritis 12/2008   bx provem membranous GN, sees renal q year  . Grave's disease    reportedly, history of i nthe past u/s 6/11 slightly enlarged gland w/o nodules  . H/O: hematuria    saw urology 3/09, neg w/u  . Hyperlipemia   . Hypertension   . Secondary hyperparathyroidism, renal (HCC)    Dr. [name]  . Urinary retention 01/2009   was seen at the ER and followup by urology    Past Surgical History:  Procedure Laterality Date  . right knee surgery     around 2015    Social History   Socioeconomic History  . Marital status: Married    Spouse name: Not on file  . Number of children: 3  . Years of education: Not on file  . Highest education level: Not on file  Occupational History  . Occupation: welder  Social Needs  . Financial resource strain: Not on file  . Food insecurity:    Worry: Not on file    Inability: Not on file  . Transportation needs:    Medical: Not on file    Non-medical: Not on file  Tobacco Use  . Smoking status: Former Smoker    Packs/day: 1.00    Last attempt to quit: 04/17/1989    Years since quitting: 28.5  . Smokeless tobacco: Never Used  Substance and Sexual Activity  . Alcohol use: No  . Drug use: No  . Sexual activity: Not on file  Lifestyle  . Physical activity:    Days per week: Not on file    Minutes  per session: Not on file  . Stress: Not on file  Relationships  . Social connections:    Talks on phone: Not on file    Gets together: Not on file    Attends religious service: Not on file    Active member of club or organization: Not on file    Attends meetings of clubs or organizations: Not on file    Relationship status: Not on file  . Intimate partner violence:    Fear of current or ex partner: Not on file    Emotionally abused: Not on file    Physically abused: Not on file    Forced sexual activity: Not on file  Other Topics Concern  . Not on file  Social History Narrative   Original from [place], moved to [place] in the 80s , lives w/ wife      Allergies as of 11/06/2017      Reactions   Penicillins    Unsure if this drug was the cause of rash      Medication List        Accurate as of 11/06/17 11:59 PM. Always use your most recent med list.          amLODipine 5  MG tablet Commonly known as:  NORVASC Take 1 tablet (5 mg total) by mouth daily.   aspirin 81 MG tablet Take 81 mg by mouth daily. Reported on 08/31/2015   atorvastatin 80 MG tablet Commonly known as:  LIPITOR Take 1 tablet (80 mg total) by mouth daily.   clopidogrel 75 MG tablet Commonly known as:  PLAVIX Take 4 tablets (total of ) the 1st day you take this medication. Take 1 tablet ( ) by mouth daily after initial dose.   freestyle lancets Check blood sugar once daily   glucose blood test strip Commonly known as:  FREESTYLE INSULINX TEST Test blood sugar once daily. Dx code: E11.9   losartan 100 MG tablet Commonly known as:  COZAAR Take 1 tablet (100 mg total) by mouth daily.   metFORMIN 850 MG tablet Commonly known as:  GLUCOPHAGE Take 1 tablet (850 mg total) by mouth 2 (two) times daily with a meal.   metoprolol tartrate 25 MG tablet Commonly known as:  LOPRESSOR Take 0.5 tablets (12.5 mg total) by mouth 2 (two) times daily.   nitroGLYCERIN 0.4 MG SL tablet Commonly known as:   NITROSTAT Place 1 tablet (0.4 mg total) under the tongue every 5 (five) minutes as needed for chest pain.          Objective:   Physical Exam BP 128/82 (BP Location: Left Arm, Patient Position: Sitting, Cuff Size: Normal)   Pulse 70   Temp 98.5 F (36.9 C) (Oral)   Resp 14   Ht  (1.854 m)   Wt 219 lb 4 oz (99.5 kg)   SpO2 97%   BMI 28.93 kg/m  General:   Well developed, well nourished . NAD.  HEENT:  Normocephalic . Face symmetric, atraumatic Lungs:  CTA B Normal respiratory effort, no intercostal retractions, no accessory muscle use. Heart: RRR,  no murmur.  No pretibial edema bilaterally  DIABETIC FEET EXAM: No lower extremity edema Normal pedal pulses bilaterally Skin normal, all nails are quite dystrophic and thick. Pinprick examination of the feet normal. Skin: Not pale. Not jaundice Neurologic:  alert & oriented X3.  Speech normal, gait appropriate for age and unassisted Psych--  Cognition and judgment appear intact.  Cooperative with normal attention span and concentration.  Behavior appropriate. No anxious or depressed appearing.      Assessment & Plan:   Assessment DM 2009 HTN Hyperlipidemia CAD 01/2017: had CP,  stress test show an area of fixed defect, saw cardiology, likely s/p a out-of-hospital MI at the time of CP. Rx Plavix /metoprolol  echo 02/2017 nl EF Renal: Dr [name] ---Glomerulonephritis 2010, BX proven ---Secondary hyperparathyroidism HOH L GU H/o Hematuria, urology w/u 09/2007 H/o Urinary retention 2010  PLAN: DM: Since the last visit, glimepiride discontinued, metformin dose increased, does not have symptoms of low blood sugar, ambulatory CBGs in the 120s.  Will check a A1c, micro.  Feet exam negative. HTN: Well-controlled on amlodipine, losartan, metoprolol.  Checking a BMP H/o glomerulonephritis, secondary hyperparathyroidism: last renal visit 11/2016, pt understood needed to RTC prn, per note RTC 1 year; for now will get a BMP,  micro and PTH. Nail dystrophy: Patient thinks at some point he was treated for onychomycosis but does not recall any details.  We will send LFTs, nail culture and KOH.  Treat based on results. RTC 4 months  Today, I spent more than 25   min with the patient: >50% of the time counseling regards benefits of seen renal q year, also risk-benefits  of treating onychomycosis Also: -reviewing the chart

## 2017-11-06 NOTE — Patient Instructions (Signed)
GO TO THE LAB : Get the blood work     GO TO THE FRONT DESK Schedule your next appointment for a   checkup in 4 months  

## 2017-11-06 NOTE — Progress Notes (Signed)
Pre visit review using our clinic review tool, if applicable. No additional management support is needed unless otherwise documented below in the visit note. 

## 2017-11-07 NOTE — Assessment & Plan Note (Signed)
DM: Since the last visit, glimepiride discontinued, metformin dose increased, does not have symptoms of low blood sugar, ambulatory CBGs in the 120s.  Will check a A1c, micro.  Feet exam negative. HTN: Well-controlled on amlodipine, losartan, metoprolol.  Checking a BMP H/o glomerulonephritis, secondary hyperparathyroidism: last renal visit 11/2016, pt understood needed to RTC prn, per note RTC 1 year; for now will get a BMP, micro and PTH. Nail dystrophy: Patient thinks at some point he was treated for onychomycosis but does not recall any details.  We will send LFTs, nail culture and KOH.  Treat based on results. RTC 4 months

## 2017-11-09 LAB — PARATHYROID HORMONE, INTACT (NO CA): PTH: 61 pg/mL (ref 14–64)

## 2017-11-09 MED ORDER — PIOGLITAZONE HCL 30 MG PO TABS: 30.0000 mg | ORAL_TABLET | Freq: Every day | ORAL | 5 refills | Status: DC

## 2017-11-09 NOTE — Addendum Note (Signed)
Addended byConrad Houck D on: 11/09/2017 03:03 PM   Modules accepted: Orders

## 2017-12-17 LAB — CULT, FUNGUS, SKIN,HAIR,NAIL W/KOH
MICRO NUMBER:: 90542695
SPECIMEN QUALITY:: ADEQUATE

## 2017-12-17 MED ORDER — TERBINAFINE HCL 250 MG PO TABS: 250.0000 mg | ORAL_TABLET | Freq: Every day | ORAL | 0 refills | Status: DC

## 2017-12-17 NOTE — Addendum Note (Signed)
Addended byConrad [place]: Brekyn Huntoon D on: 12/17/2017 02:12 PM   Modules accepted: Orders

## 2017-12-17 NOTE — Addendum Note (Signed)
Addended byConrad [place]: Carrell Rahmani D on: 12/17/2017 02:13 PM   Modules accepted: Orders

## 2018-02-11 ENCOUNTER — Ambulatory Visit (INDEPENDENT_AMBULATORY_CARE_PROVIDER_SITE_OTHER): Payer: 59 | Admitting: Internal Medicine

## 2018-02-11 ENCOUNTER — Encounter: Payer: Self-pay | Admitting: Internal Medicine

## 2018-02-11 DIAGNOSIS — M542 Cervicalgia: Secondary | ICD-10-CM

## 2018-02-11 MED ORDER — CYCLOBENZAPRINE HCL 5 MG PO TABS
5.0000 mg | ORAL_TABLET | Freq: Two times a day (BID) | ORAL | 0 refills | Status: DC | PRN
Start: 2018-02-11 — End: 2018-03-19

## 2018-02-11 MED ORDER — DICLOFENAC SODIUM 1 % TD GEL: 2.0000 g | Freq: Three times a day (TID) | TRANSDERMAL | 0 refills | Status: DC | PRN

## 2018-02-11 NOTE — Assessment & Plan Note (Addendum)
Likely muscular injury or sprain. Not likely related to lamisil but if no improvement could check CK. He has stopped taking this. Rx for voltaren gel and flexeril to use for pain. Can continue tylenol over the counter if effective. Call back in 1-2 weeks if no improvement. Given diabetes will not do steroid burst.

## 2018-02-11 NOTE — Patient Instructions (Signed)
We have sent in voltaren gel to rub on the area that hurts up to 3 times per day.   We have also sent in the mucle relaxer flexeril to take 1 pill up to twice a day for the pain.

## 2018-02-11 NOTE — Progress Notes (Signed)
   Subjective:    Patient ID: Calvin LimboJorge Helmkamp Sr., male    DOB: [birth year], 62 y.o.   MRN: [id number]  HPI The patient is a 62 YO man coming in for neck pain. Started 2-3 days ago. Has tried tylenol which was not effective. Pain is 5-6/10 Overall it is worsening. He denies injury or overuse. The pain is in the side of the left neck. Does not radiate anywhere. No pain in his jaw or chest. The pain does not move into his arm. Denies numbness associated. This started soon after he started lamisil so he has stopped that thinking it could be related.   Review of Systems  Constitutional: Positive for activity change. Negative for appetite change, fatigue, fever and unexpected weight change.  Respiratory: Negative.   Cardiovascular: Negative.   Musculoskeletal: Positive for arthralgias, myalgias, neck pain and neck stiffness. Negative for back pain.  Skin: Negative.   Neurological: Negative for syncope, weakness and numbness.      Objective:   Physical Exam  Constitutional: He is oriented to person, place, and time. He appears well-developed and well-nourished.  HENT:  Head: Normocephalic and atraumatic.  Eyes: EOM are normal.  Neck: Normal range of motion.  Cardiovascular: Normal rate and regular rhythm.  Pulmonary/Chest: Effort normal and breath sounds normal. No respiratory distress. He has no wheezes. He has no rales.  Musculoskeletal: He exhibits tenderness. He exhibits no edema.  Some stiffness in the neck and pain in the left lateral muscles of the neck, no pain in the trapezius or in the arm or shoulder. No pain in the chin or chest.   Neurological: He is alert and oriented to person, place, and time. Coordination normal.  Skin: Skin is warm and dry.   Vitals:   02/11/18 1001  BP: (!) 160/84  Pulse: 71  Temp: 98.3 F (36.8 C)  TempSrc: Oral  SpO2: 98%  Weight: 228 lb (103.4 kg)  Height: 6\' 1"  (1.854 m)      Assessment & Plan:

## 2018-02-12 ENCOUNTER — Other Ambulatory Visit: Payer: Self-pay

## 2018-02-12 ENCOUNTER — Observation Stay (HOSPITAL_COMMUNITY)
Admission: EM | Admit: 2018-02-12 | Discharge: 2018-02-14 | Disposition: A | Discharge status: Home / Self Care | Payer: 59 | Attending: Internal Medicine | Admitting: Internal Medicine

## 2018-02-12 DIAGNOSIS — N390 Urinary tract infection, site not specified: Secondary | ICD-10-CM | POA: Diagnosis not present

## 2018-02-12 DIAGNOSIS — N052 Unspecified nephritic syndrome with diffuse membranous glomerulonephritis: Secondary | ICD-10-CM | POA: Diagnosis present

## 2018-02-12 DIAGNOSIS — E1169 Type 2 diabetes mellitus with other specified complication: Secondary | ICD-10-CM | POA: Insufficient documentation

## 2018-02-12 DIAGNOSIS — E785 Hyperlipidemia, unspecified: Secondary | ICD-10-CM | POA: Diagnosis not present

## 2018-02-12 DIAGNOSIS — E1159 Type 2 diabetes mellitus with other circulatory complications: Secondary | ICD-10-CM

## 2018-02-12 DIAGNOSIS — N032 Chronic nephritic syndrome with diffuse membranous glomerulonephritis: Secondary | ICD-10-CM | POA: Insufficient documentation

## 2018-02-12 DIAGNOSIS — I152 Hypertension secondary to endocrine disorders: Secondary | ICD-10-CM

## 2018-02-12 DIAGNOSIS — R339 Retention of urine, unspecified: Secondary | ICD-10-CM

## 2018-02-12 DIAGNOSIS — Z79899 Other long term (current) drug therapy: Secondary | ICD-10-CM | POA: Insufficient documentation

## 2018-02-12 DIAGNOSIS — I2510 Atherosclerotic heart disease of native coronary artery without angina pectoris: Secondary | ICD-10-CM | POA: Diagnosis not present

## 2018-02-12 DIAGNOSIS — I639 Cerebral infarction, unspecified: Secondary | ICD-10-CM | POA: Diagnosis not present

## 2018-02-12 DIAGNOSIS — Z7984 Long term (current) use of oral hypoglycemic drugs: Secondary | ICD-10-CM | POA: Diagnosis not present

## 2018-02-12 DIAGNOSIS — M542 Cervicalgia: Secondary | ICD-10-CM | POA: Diagnosis not present

## 2018-02-12 DIAGNOSIS — R51 Headache: Secondary | ICD-10-CM | POA: Diagnosis not present

## 2018-02-12 DIAGNOSIS — E119 Type 2 diabetes mellitus without complications: Secondary | ICD-10-CM

## 2018-02-12 DIAGNOSIS — I10 Essential (primary) hypertension: Secondary | ICD-10-CM | POA: Diagnosis not present

## 2018-02-12 DIAGNOSIS — E8881 Metabolic syndrome: Secondary | ICD-10-CM | POA: Diagnosis not present

## 2018-02-12 DIAGNOSIS — Z87891 Personal history of nicotine dependence: Secondary | ICD-10-CM | POA: Insufficient documentation

## 2018-02-12 DIAGNOSIS — E669 Obesity, unspecified: Secondary | ICD-10-CM

## 2018-02-12 DIAGNOSIS — R4781 Slurred speech: Secondary | ICD-10-CM | POA: Diagnosis present

## 2018-02-12 NOTE — ED Triage Notes (Signed)
Pt report pain to posterior head/occipital area since Wednesday. Denies recent trauma or similar. Seen at doctor yesterday and given voltaren gel & flexeril. Pt reports no improvement. Denies fevers, chills, weakness, slurred speech or other s/sx.

## 2018-02-13 ENCOUNTER — Encounter (HOSPITAL_COMMUNITY): Payer: Self-pay | Admitting: Nurse Practitioner

## 2018-02-13 ENCOUNTER — Observation Stay (HOSPITAL_COMMUNITY): Payer: 59

## 2018-02-13 ENCOUNTER — Emergency Department (HOSPITAL_COMMUNITY): Payer: 59

## 2018-02-13 ENCOUNTER — Other Ambulatory Visit: Payer: Self-pay

## 2018-02-13 DIAGNOSIS — R51 Headache: Secondary | ICD-10-CM | POA: Diagnosis not present

## 2018-02-13 DIAGNOSIS — N390 Urinary tract infection, site not specified: Secondary | ICD-10-CM | POA: Diagnosis not present

## 2018-02-13 DIAGNOSIS — R079 Chest pain, unspecified: Secondary | ICD-10-CM | POA: Diagnosis not present

## 2018-02-13 DIAGNOSIS — I10 Essential (primary) hypertension: Secondary | ICD-10-CM | POA: Diagnosis not present

## 2018-02-13 DIAGNOSIS — M5412 Radiculopathy, cervical region: Secondary | ICD-10-CM | POA: Diagnosis not present

## 2018-02-13 DIAGNOSIS — I639 Cerebral infarction, unspecified: Secondary | ICD-10-CM

## 2018-02-13 DIAGNOSIS — E1169 Type 2 diabetes mellitus with other specified complication: Secondary | ICD-10-CM

## 2018-02-13 DIAGNOSIS — R339 Retention of urine, unspecified: Secondary | ICD-10-CM

## 2018-02-13 DIAGNOSIS — M542 Cervicalgia: Secondary | ICD-10-CM | POA: Diagnosis not present

## 2018-02-13 DIAGNOSIS — E1159 Type 2 diabetes mellitus with other circulatory complications: Secondary | ICD-10-CM

## 2018-02-13 DIAGNOSIS — E118 Type 2 diabetes mellitus with unspecified complications: Secondary | ICD-10-CM | POA: Diagnosis not present

## 2018-02-13 DIAGNOSIS — E669 Obesity, unspecified: Secondary | ICD-10-CM

## 2018-02-13 LAB — GLUCOSE, CAPILLARY
Glucose-Capillary: 118 mg/dL — ABNORMAL HIGH (ref 70–99)
Glucose-Capillary: 155 mg/dL — ABNORMAL HIGH (ref 70–99)
Glucose-Capillary: 110 mg/dL — ABNORMAL HIGH (ref 70–99)

## 2018-02-13 LAB — CBC WITH DIFFERENTIAL/PLATELET
Abs Immature Granulocytes: 0 10*3/uL (ref 0.0–0.1)
Basophils Absolute: 0 10*3/uL (ref 0.0–0.1)
Basophils Relative: 1 %
Eosinophils Absolute: 0.1 10*3/uL (ref 0.0–0.7)
Eosinophils Relative: 2 %
HCT: 43.2 % (ref 39.0–52.0)
Hemoglobin: 14.2 g/dL (ref 13.0–17.0)
Immature Granulocytes: 0 %
Lymphocytes Relative: 20 %
Lymphs Abs: 1.2 10*3/uL (ref 0.7–4.0)
MCH: 29 pg (ref 26.0–34.0)
MCHC: 32.9 g/dL (ref 30.0–36.0)
MCV: 88.3 fL (ref 78.0–100.0)
Monocytes Absolute: 0.6 10*3/uL (ref 0.1–1.0)
Monocytes Relative: 10 %
Neutro Abs: 4 10*3/uL (ref 1.7–7.7)
Neutrophils Relative %: 67 %
Platelets: 147 10*3/uL — ABNORMAL LOW (ref 150–400)
RBC: 4.89 MIL/uL (ref 4.22–5.81)
RDW: 13.6 % (ref 11.5–15.5)
WBC: 6 10*3/uL (ref 4.0–10.5)

## 2018-02-13 LAB — I-STAT TROPONIN, ED: Troponin i, poc: 0.02 ng/mL (ref 0.00–0.08)

## 2018-02-13 LAB — URINALYSIS, ROUTINE W REFLEX MICROSCOPIC
Bilirubin Urine: NEGATIVE
Glucose, UA: 50 mg/dL — AB
Ketones, ur: NEGATIVE mg/dL
Leukocytes, UA: NEGATIVE
Nitrite: POSITIVE — AB
Protein, ur: 30 mg/dL — AB
Specific Gravity, Urine: 1.014 (ref 1.005–1.030)
pH: 7 (ref 5.0–8.0)

## 2018-02-13 LAB — COMPREHENSIVE METABOLIC PANEL
ALT: 20 U/L (ref 0–44)
AST: 19 U/L (ref 15–41)
Albumin: 3.3 g/dL — ABNORMAL LOW (ref 3.5–5.0)
Alkaline Phosphatase: 78 U/L (ref 38–126)
Anion gap: 9 (ref 5–15)
BUN: 20 mg/dL (ref 8–23)
CO2: 24 mmol/L (ref 22–32)
Calcium: 8.5 mg/dL — ABNORMAL LOW (ref 8.9–10.3)
Chloride: 107 mmol/L (ref 98–111)
Creatinine, Ser: 1.18 mg/dL (ref 0.61–1.24)
GFR calc Af Amer: >60 mL/min (ref >60)
GFR calc non Af Amer: >60 mL/min (ref >60)
Glucose, Bld: 145 mg/dL — ABNORMAL HIGH (ref 70–99)
Potassium: 3.5 mmol/L (ref 3.5–5.1)
Sodium: 140 mmol/L (ref 135–145)
Total Bilirubin: 1.2 mg/dL (ref 0.3–1.2)
Total Protein: 6.2 g/dL — ABNORMAL LOW (ref 6.5–8.1)

## 2018-02-13 LAB — APTT: aPTT: 29 seconds (ref 24–36)

## 2018-02-13 LAB — PROTIME-INR
INR: 0.98
Prothrombin Time: 12.9 seconds (ref 11.4–15.2)

## 2018-02-13 LAB — RAPID URINE DRUG SCREEN, HOSP PERFORMED
Amphetamines: NOT DETECTED
Barbiturates: NOT DETECTED
Benzodiazepines: NOT DETECTED
Cocaine: NOT DETECTED
Opiates: NOT DETECTED
Tetrahydrocannabinol: NOT DETECTED

## 2018-02-13 LAB — HEMOGLOBIN A1C
Hgb A1c MFr Bld: 6.7 % — ABNORMAL HIGH (ref 4.8–5.6)
Mean Plasma Glucose: 145.59 mg/dL

## 2018-02-13 LAB — ETHANOL: Alcohol, Ethyl (B): <10 mg/dL (ref <10)

## 2018-02-13 LAB — CBG MONITORING, ED: Glucose-Capillary: 132 mg/dL — ABNORMAL HIGH (ref 70–99)

## 2018-02-13 MED ORDER — SODIUM CHLORIDE 0.9 % IV SOLN
1.0000 g | Freq: Once | INTRAVENOUS | Status: AC
  Administered 2018-02-13: 1 g via INTRAVENOUS
  Filled 2018-02-13: qty 10

## 2018-02-13 MED ORDER — CYCLOBENZAPRINE HCL 10 MG PO TABS
5.0000 mg | ORAL_TABLET | Freq: Two times a day (BID) | ORAL | Status: DC | PRN
  Administered 2018-02-13: 5 mg via ORAL
  Filled 2018-02-13: qty 1

## 2018-02-13 MED ORDER — SENNOSIDES-DOCUSATE SODIUM 8.6-50 MG PO TABS
1.0000 | ORAL_TABLET | Freq: Every evening | ORAL | Status: DC | PRN
Start: 2018-02-13 — End: 2018-02-14

## 2018-02-13 MED ORDER — STROKE: EARLY STAGES OF RECOVERY BOOK
Freq: Once | Status: AC
  Administered 2018-02-13: 13:00:00
  Filled 2018-02-13 (×2): qty 1

## 2018-02-13 MED ORDER — ACETAMINOPHEN 325 MG PO TABS
650.0000 mg | ORAL_TABLET | ORAL | Status: DC | PRN
  Administered 2018-02-13: 650 mg via ORAL
  Filled 2018-02-13: qty 2

## 2018-02-13 MED ORDER — SODIUM CHLORIDE 0.9 % IV SOLN
1.0000 g | INTRAVENOUS | Status: DC
  Filled 2018-02-13: qty 10

## 2018-02-13 MED ORDER — ASPIRIN 325 MG PO TABS
325.0000 mg | ORAL_TABLET | Freq: Every day | ORAL | Status: DC
  Administered 2018-02-13: 325 mg via ORAL
  Filled 2018-02-13: qty 1

## 2018-02-13 MED ORDER — ACETAMINOPHEN 650 MG RE SUPP: 650.0000 mg | RECTAL | Status: DC | PRN

## 2018-02-13 MED ORDER — METOCLOPRAMIDE HCL 5 MG/ML IJ SOLN
10.0000 mg | Freq: Once | INTRAMUSCULAR | Status: AC
  Administered 2018-02-13: 10 mg via INTRAVENOUS
  Filled 2018-02-13: qty 2

## 2018-02-13 MED ORDER — GADOBENATE DIMEGLUMINE 529 MG/ML IV SOLN
20.0000 mL | Freq: Once | INTRAVENOUS | Status: AC
  Administered 2018-02-13: 20 mL via INTRAVENOUS

## 2018-02-13 MED ORDER — PIOGLITAZONE HCL 30 MG PO TABS
30.0000 mg | ORAL_TABLET | Freq: Every day | ORAL | Status: DC
  Administered 2018-02-14: 30 mg via ORAL
  Filled 2018-02-13 (×4): qty 1

## 2018-02-13 MED ORDER — ENOXAPARIN SODIUM 40 MG/0.4ML ~~LOC~~ SOLN
40.0000 mg | SUBCUTANEOUS | Status: DC
  Administered 2018-02-13 – 2018-02-14 (×2): 40 mg via SUBCUTANEOUS
  Filled 2018-02-13 (×2): qty 0.4

## 2018-02-13 MED ORDER — ATORVASTATIN CALCIUM 80 MG PO TABS
80.0000 mg | ORAL_TABLET | Freq: Every evening | ORAL | Status: DC
  Administered 2018-02-13: 80 mg via ORAL
  Filled 2018-02-13: qty 1

## 2018-02-13 MED ORDER — DICLOFENAC SODIUM 1 % TD GEL
2.0000 g | Freq: Three times a day (TID) | TRANSDERMAL | Status: DC | PRN
  Filled 2018-02-13: qty 100

## 2018-02-13 MED ORDER — ACETAMINOPHEN 160 MG/5ML PO SOLN: 650.0000 mg | ORAL | Status: DC | PRN

## 2018-02-13 MED ORDER — DIPHENHYDRAMINE HCL 25 MG PO CAPS
50.0000 mg | ORAL_CAPSULE | Freq: Once | ORAL | Status: AC
  Administered 2018-02-13: 50 mg via ORAL
  Filled 2018-02-13: qty 2

## 2018-02-13 MED ORDER — INSULIN ASPART 100 UNIT/ML ~~LOC~~ SOLN
0.0000 [IU] | Freq: Three times a day (TID) | SUBCUTANEOUS | Status: DC
  Administered 2018-02-13: 4 [IU] via SUBCUTANEOUS
  Administered 2018-02-14: 3 [IU] via SUBCUTANEOUS

## 2018-02-13 MED ORDER — SODIUM CHLORIDE 0.9 % IV SOLN
INTRAVENOUS | Status: AC
  Administered 2018-02-13: 11:00:00 via INTRAVENOUS

## 2018-02-13 MED ORDER — SODIUM CHLORIDE 0.9 % IV BOLUS
1000.0000 mL | Freq: Once | INTRAVENOUS | Status: AC
  Administered 2018-02-13: 1000 mL via INTRAVENOUS

## 2018-02-13 MED ORDER — NITROGLYCERIN 0.4 MG SL SUBL: 0.4000 mg | SUBLINGUAL_TABLET | SUBLINGUAL | Status: DC | PRN

## 2018-02-13 NOTE — ED Notes (Signed)
Patient transported to MRI 

## 2018-02-13 NOTE — Progress Notes (Signed)
PT Cancellation Note  Patient Details Name: Calvin LimboJorge Mclane Sr. MRN: [id number] DOB: [birth year]   Cancelled Treatment:    Reason Eval/Treat Not Completed: Patient at procedure or test/unavailable, MRI will follow   Fabio AsaDevon J Lilybelle Mayeda 02/13/2018, 2:17 PM

## 2018-02-13 NOTE — ED Provider Notes (Signed)
[facility] EMERGENCY DEPARTMENT Provider Note   CSN: [id number] Arrival date & time: 02/12/18  2157     History   Chief Complaint Chief Complaint  Patient presents with  . Headache    HPI Calvin Rodenberg Sr. is a 62 y.o. male with a past medical history of DM, glomerulonephritis, Graves' disease, hypertension, hyperlipidemia, secondary hyperparathyroidism, who presents today for evaluation of headache.  He reports that since Wednesday he has had pain to the back of his neck.  He denies any fevers or chills at home.  He reports that his pain is made worse with movement.  He denies any headache pain.  His wife reports that approximately 24 hours prior to his presentation he developed speech changes.  She states that his speech seems choppy and slurred when compared to his usual.  He denies any weakness, numbness, or tingling.  He was seen at his doctor the day before and started on Voltaren gel and Flexeril without significant relief and improvement.  HPI  Past Medical History:  Diagnosis Date  . Decreased hearing    Left  . Diabetes mellitus 2009   adult onset  . Glomerulonephritis 12/2008   bx provem membranous GN, sees renal q year  . Grave's disease    reportedly, history of i nthe past u/s 6/11 slightly enlarged gland w/o nodules  . H/O: hematuria    saw urology 3/09, neg w/u  . Hyperlipemia   . Hypertension   . Secondary hyperparathyroidism, renal (HCC)    Dr. [name]  . Urinary retention 01/2009   was seen at the ER and followup by urology    Patient Active Problem List   Diagnosis Date Noted  . Neck pain 02/11/2018  . Overweight (BMI 25.0-29.9) 05/22/2017  . CAD (coronary artery disease) 05/03/2017  . PCP NOTES >>>>>>>>>>>>>>>>>>>>>>> 08/31/2015  . Knee pain 11/26/2012  . Annual physical exam 04/18/2011  . Decreased hearing   . Membranous glomerulonephritis 01/16/2009  . DM II (diabetes mellitus, type II), controlled (HCC) 10/01/2007  .  Hyperlipidemia 08/23/2006  . HTN (hypertension) 08/23/2006    Past Surgical History:  Procedure Laterality Date  . right knee surgery     around 2015        Home Medications    Prior to Admission medications   Medication Sig Start Date End Date Taking? Authorizing Provider  atorvastatin (LIPITOR) 80 MG tablet Take 1 tablet (80 mg total) by mouth daily. 05/01/17  Yes Paz, Nolon Rod, MD  cyclobenzaprine (FLEXERIL) 5 MG tablet Take 1 tablet (5 mg total) by mouth 2 (two) times daily as needed for muscle spasms. 02/11/18  Yes Myrlene Broker, MD  diclofenac sodium (VOLTAREN) 1 % GEL Apply 2 g topically 3 (three) times daily as needed (pain). 02/11/18  Yes Myrlene Broker, MD  losartan (COZAAR) 100 MG tablet Take 1 tablet (100 mg total) by mouth daily. 05/01/17  Yes [name], MD  metFORMIN (GLUCOPHAGE) 850 MG tablet Take 1 tablet (850 mg total) by mouth 2 (two) times daily with a meal. 08/03/17  Yes Paz, Nolon Rod, MD  metoprolol tartrate (LOPRESSOR) 25 MG tablet Take 0.5 tablets (12.5 mg total) by mouth 2 (two) times daily. 02/20/17 02/13/26 Yes Azalee Course, PA  nitroGLYCERIN (NITROSTAT) 0.4 MG SL tablet Place 1 tablet (0.4 mg total) under the tongue every 5 (five) minutes as needed for chest pain. 02/20/17 02/13/25 Yes Azalee Course, PA  pioglitazone (ACTOS) 30 MG tablet Take 1 tablet (30 mg  total) by mouth daily. 11/09/17  Yes Paz, Nolon Rod, MD  amLODipine (NORVASC) 5 MG tablet Take 1 tablet (5 mg total) by mouth daily. Patient not taking: Reported on 02/11/2018 05/01/17   [name], MD  clopidogrel (PLAVIX) 75 MG tablet Take 4 tablets (total of 300mg ) the 1st day you take this medication. Take 1 tablet (75mg ) by mouth daily after initial dose. Patient not taking: Reported on 02/11/2018 02/20/17   Azalee Course, PA  glucose blood (FREESTYLE INSULINX TEST) test strip Test blood sugar once daily. Dx code: E11.9 06/20/16   [name], MD  Lancets (FREESTYLE) lancets Check blood sugar once daily 06/20/16    [name], MD  terbinafine (LAMISIL) 250 MG tablet Take 1 tablet (250 mg total) by mouth daily. Patient not taking: Reported on 02/13/2018 12/17/17   [name], MD    Family History Family History  Problem Relation Age of Onset  . Diabetes Other        GM  . Diabetes Maternal Grandmother   . Hypertension Maternal Grandmother   . Healthy Mother   . Heart attack Neg Hx   . Colon cancer Neg Hx   . Prostate cancer Neg Hx     Social History Social History   Tobacco Use  . Smoking status: Former Smoker    Packs/day: 1.00    Last attempt to quit: 04/17/1989    Years since quitting: 28.8  . Smokeless tobacco: Never Used  Substance Use Topics  . Alcohol use: No  . Drug use: No     Allergies   Penicillins   Review of Systems Review of Systems  Constitutional: Negative for chills and fever.  HENT: Negative for ear pain and sore throat.   Eyes: Negative for pain and visual disturbance.  Respiratory: Negative for cough and shortness of breath.   Cardiovascular: Negative for chest pain and palpitations.  Gastrointestinal: Negative for abdominal pain, diarrhea, nausea and vomiting.  Genitourinary: Negative for dysuria and hematuria.  Musculoskeletal: Positive for neck pain and neck stiffness. Negative for arthralgias, back pain and joint swelling.  Skin: Negative for color change and rash.  Neurological: Positive for speech difficulty. Negative for seizures, syncope and headaches.  All other systems reviewed and are negative.    Physical Exam Updated Vital Signs BP (!) 163/107   Pulse 67   Temp 98.4 F (36.9 C) (Rectal)   Resp 18   Ht 6\' 1"  (1.854 m)   Wt 105.2 kg   SpO2 99%   BMI 30.61 kg/m   Physical Exam  Constitutional: He appears well-developed and well-nourished.  Non-toxic appearance. No distress.  HENT:  Head: Normocephalic and atraumatic.  Mouth/Throat: Oropharynx is clear and moist.  Eyes: Pupils are equal, round, and reactive to light. Conjunctivae  and EOM are normal.  Neck: Normal range of motion. Neck supple. No neck rigidity.  There is slight midline tenderness to palpation of the C-spine, however this is diffuse.  There is worse tenderness to palpation on the left-sided paraspinal muscles, primarily suboccipitally.  Palpation over these areas both re-creates and exacerbates his reported neck pain.  Cardiovascular: Normal rate, regular rhythm, normal heart sounds and intact distal pulses. Exam reveals no gallop.  No murmur heard. Pulmonary/Chest: Effort normal and breath sounds normal. No respiratory distress.  Abdominal: Soft. There is no tenderness.  Musculoskeletal: He exhibits no edema.  Lymphadenopathy:    He has no cervical adenopathy.  Neurological: He is alert.  Mental Status:  Alert,  oriented, thought content appropriate.  Speech is slurred according to wife.  His speech is possibly dysarthric, difficult to judge as patient's primary language is not english.  Able to follow 2 step commands without difficulty.  Cranial Nerves:  II:  Peripheral visual fields grossly normal, pupils equal, round, reactive to light III,IV, VI: ptosis not present, extra-ocular motions intact bilaterally  V,VII: smile symmetric, facial light touch sensation equal VIII: hearing grossly normal to voice  X: uvula elevates symmetrically  XI: bilateral shoulder shrug symmetric and strong XII: midline tongue extension without fassiculations Motor:  Normal tone. 5/5 in upper and lower extremities bilaterally including strong and equal grip strength and dorsiflexion/plantar flexion Cerebellar: normal finger-to-nose with bilateral upper extremities Gait: normal gait and balance CV: distal pulses palpable throughout    Skin: Skin is warm and dry.  Psychiatric: He has a normal mood and affect.  Nursing note and vitals reviewed.    ED Treatments / Results  Labs (all labs ordered are listed, but only abnormal results are displayed) Labs Reviewed    COMPREHENSIVE METABOLIC PANEL - Abnormal; Notable for the following components:      Result Value   Glucose, Bld 145 (*)    Calcium 8.5 (*)    Total Protein 6.2 (*)    Albumin 3.3 (*)    All other components within normal limits  CBC WITH DIFFERENTIAL/PLATELET - Abnormal; Notable for the following components:   Platelets 147 (*)    All other components within normal limits  URINALYSIS, ROUTINE W REFLEX MICROSCOPIC - Abnormal; Notable for the following components:   APPearance HAZY (*)    Glucose, UA 50 (*)    Hgb urine dipstick MODERATE (*)    Protein, ur 30 (*)    Nitrite POSITIVE (*)    Bacteria, UA FEW (*)    All other components within normal limits  CBG MONITORING, ED - Abnormal; Notable for the following components:   Glucose-Capillary 132 (*)    All other components within normal limits  URINE CULTURE  ETHANOL  PROTIME-INR  APTT  RAPID URINE DRUG SCREEN, HOSP PERFORMED  I-STAT TROPONIN, ED    EKG EKG Interpretation  Date/Time:  Saturday February 13 2018 04:30:52 EDT Ventricular Rate:  66 PR Interval:    QRS Duration: 114 QT Interval:  436 QTC Calculation: 457 R Axis:   -33 Text Interpretation:  Sinus rhythm Incomplete right bundle branch block Inferior infarct, old No old tracing to compare Confirmed by Dione Booze ([id number]) on 02/13/2018 4:39:48 AM   Radiology Ct Head Wo Contrast  Result Date: 02/13/2018 CLINICAL DATA:  Acute onset of neck pain. Posterior headache. Question of cerebrovascular accident. EXAM: CT HEAD WITHOUT CONTRAST CT CERVICAL SPINE WITHOUT CONTRAST TECHNIQUE: Multidetector CT imaging of the head and cervical spine was performed following the standard protocol without intravenous contrast. Multiplanar CT image reconstructions of the cervical spine were also generated. COMPARISON:  Cervical spine radiographs performed 04/03/2015 FINDINGS: CT HEAD FINDINGS Brain: No evidence of acute infarction, hemorrhage, hydrocephalus, extra-axial collection or  mass lesion / mass effect. Prominence of the sulci reflects mild cortical volume loss. The brainstem and fourth ventricle are within normal limits. The basal ganglia are unremarkable in appearance. The cerebral hemispheres demonstrate grossly normal gray-white differentiation. No mass effect or midline shift is seen. Vascular: No hyperdense vessel or unexpected calcification. Skull: There is no evidence of fracture; visualized osseous structures are unremarkable in appearance. Sinuses/Orbits: The orbits are within normal limits. Mild mucosal thickening is noted at the  right maxillary sinus and sphenoid sinus. The remaining paranasal sinuses and mastoid air cells are well-aerated. Other: No significant soft tissue abnormalities are seen. CT CERVICAL SPINE FINDINGS Alignment: Normal. Skull base and vertebrae: No acute fracture. No primary bone lesion or focal pathologic process. Soft tissues and spinal canal: No prevertebral fluid or swelling. No visible canal hematoma. Disc levels: Intervertebral disc spaces are preserved. Scattered anterior and posterior disc osteophyte complexes are noted along the lower cervical spine. Upper chest: Minimal atelectasis is noted at the lung apices. Mild calcification is noted at the carotid bifurcations bilaterally. The thyroid gland is unremarkable. Other: No additional soft tissue abnormalities are seen. IMPRESSION: 1. No evidence of traumatic intracranial injury or fracture. 2. No evidence of fracture or subluxation along the cervical spine. 3. Mild cortical volume loss noted. 4. Mild mucosal thickening at the right maxillary sinus and sphenoid sinus. 5. Mild calcification at the carotid bifurcations bilaterally. Electronically Signed   By: Roanna Raider M.D.   On: 02/13/2018 03:44   Ct Cervical Spine Wo Contrast  Result Date: 02/13/2018 CLINICAL DATA:  Acute onset of neck pain. Posterior headache. Question of cerebrovascular accident. EXAM: CT HEAD WITHOUT CONTRAST CT  CERVICAL SPINE WITHOUT CONTRAST TECHNIQUE: Multidetector CT imaging of the head and cervical spine was performed following the standard protocol without intravenous contrast. Multiplanar CT image reconstructions of the cervical spine were also generated. COMPARISON:  Cervical spine radiographs performed 04/03/2015 FINDINGS: CT HEAD FINDINGS Brain: No evidence of acute infarction, hemorrhage, hydrocephalus, extra-axial collection or mass lesion / mass effect. Prominence of the sulci reflects mild cortical volume loss. The brainstem and fourth ventricle are within normal limits. The basal ganglia are unremarkable in appearance. The cerebral hemispheres demonstrate grossly normal gray-white differentiation. No mass effect or midline shift is seen. Vascular: No hyperdense vessel or unexpected calcification. Skull: There is no evidence of fracture; visualized osseous structures are unremarkable in appearance. Sinuses/Orbits: The orbits are within normal limits. Mild mucosal thickening is noted at the right maxillary sinus and sphenoid sinus. The remaining paranasal sinuses and mastoid air cells are well-aerated. Other: No significant soft tissue abnormalities are seen. CT CERVICAL SPINE FINDINGS Alignment: Normal. Skull base and vertebrae: No acute fracture. No primary bone lesion or focal pathologic process. Soft tissues and spinal canal: No prevertebral fluid or swelling. No visible canal hematoma. Disc levels: Intervertebral disc spaces are preserved. Scattered anterior and posterior disc osteophyte complexes are noted along the lower cervical spine. Upper chest: Minimal atelectasis is noted at the lung apices. Mild calcification is noted at the carotid bifurcations bilaterally. The thyroid gland is unremarkable. Other: No additional soft tissue abnormalities are seen. IMPRESSION: 1. No evidence of traumatic intracranial injury or fracture. 2. No evidence of fracture or subluxation along the cervical spine. 3. Mild  cortical volume loss noted. 4. Mild mucosal thickening at the right maxillary sinus and sphenoid sinus. 5. Mild calcification at the carotid bifurcations bilaterally. Electronically Signed   By: Roanna Raider M.D.   On: 02/13/2018 03:44   Mr Brain Wo Contrast  Result Date: 02/13/2018 CLINICAL DATA:  Occipital head pain.  No trauma. EXAM: MRI HEAD WITHOUT CONTRAST TECHNIQUE: Multiplanar, multiecho pulse sequences of the brain and surrounding structures were obtained without intravenous contrast. COMPARISON:  Head CT 02/13/2018 FINDINGS: BRAIN: Small, 3 mm focus of acute ischemia at the inferior aspect of the right caudate head. No other diffusion abnormality. The midline structures are normal. There are no old infarcts. Minimal white matter hyperintensity, nonspecific and commonly  seen in asymptomatic patients of this age. The CSF spaces are normal for age, with no hydrocephalus. Susceptibility-sensitive sequences show no chronic microhemorrhage or superficial siderosis. VASCULAR: Major intracranial arterial and venous sinus flow voids are preserved. SKULL AND UPPER CERVICAL SPINE: The visualized skull base, calvarium, upper cervical spine and extracranial soft tissues are normal. SINUSES/ORBITS: Mild ethmoid sinus mucosal thickening. The orbits are normal. IMPRESSION: Punctate focus of acute ischemia at the inferior aspect of the right caudate head. No hemorrhage or mass effect. Electronically Signed   By: Deatra RobinsonKevin  Herman M.D.   On: 02/13/2018 07:01    Procedures Procedures (including critical care time)  Medications Ordered in ED Medications  sodium chloride 0.9 % bolus 1,000 mL (0 mLs Intravenous Stopped 02/13/18 0448)  metoCLOPramide (REGLAN) injection 10 mg (10 mg Intravenous Given 02/13/18 0335)  diphenhydrAMINE (BENADRYL) capsule 50 mg (50 mg Oral Given 02/13/18 0335)  cefTRIAXone (ROCEPHIN) 1 g in sodium chloride 0.9 % 100 mL IVPB (0 g Intravenous Stopped 02/13/18 0704)     Initial Impression /  Assessment and Plan / ED Course  I have reviewed the triage vital signs and the nursing notes.  Pertinent labs & imaging results that were available during my care of the patient were reviewed by me and considered in my medical decision making (see chart for details).    Patient presents today for evaluation of neck pain.  He was seen by his PCP and given Voltaren gel along with Flexeril without significant improvement.  On my evaluation he is afebrile, not tachycardic or tachypneic without elevated white count.  He does have neck stiffness and pain, however this is primarily on the left sided paraspinal area.  His wife reported that his speech was slurred for over 24 hours prior to arrival.  He did not have any other neurologic deficits, is slightly dysarthric.  CT head and neck were obtained without acute abnormalities.  UA showing UTI, patient denied any urinary symptoms.  Plan for MRI of head for further evaluation.  He was given saline bolus along with Reglan and Benadryl.  He was given Rocephin for his UTI.  [facility]  who will follow pending studies, re-evaulate and determine disposition.      Final Clinical Impressions(s) / ED Diagnoses   Final diagnoses:  Cerebrovascular accident (CVA), unspecified mechanism [facility](HCC)    ED Discharge Orders    None       Norman ClayHammond, Elizabeth W, PA-C 02/13/18 [id number]    Dione BoozeGlick, David, MD 02/13/18 [phone]

## 2018-02-13 NOTE — Progress Notes (Signed)
Pt BP 139/101 . Paged MD via Amion text page to request pt home meds for BP.

## 2018-02-13 NOTE — H&P (Addendum)
History and Physical    Calvin Vitelli Sr. ZOX:[id number] DOB: 1955-08-28 DOA: 02/12/2018  PCP: [name], MD   Patient coming from: Home  I have personally briefly reviewed patient's old medical records in [facility] Link  Chief Complaint: 36 hours of slurred speech and neck pain.  HPI: Calvin Siefring Sr. is a 62 y.o. male with medical history significant of Beatties, Graves' disease, hypertension, hyperlipidemia, Leonie Douglas nephritis, secondary hyperparathyroidism who presents for evaluation of neck pain and slurred speech.  Since Wednesday he has had severe pain to the back of his neck.  He saw his primary care doctor who prescribed Voltaren gel as well as some muscle relaxers.  He has had no fever or chills at home and the pain is made worse with movement he has no headache.  His wife then reported 24 hours prior to presentation that he developed some speech changes she stated that his speech is choppy and slurred when compared to his usual.  He denies any weakness numbness or tingling.  He was seen at his doctor's office and at that time his blood pressures were noted to be a proximally 156.  Reports taking his medications but upon review of the medication reconciliation form performed by pharmacy patient has stopped taking his amlodipine.  Need to notes from Dr. pause his primary care physician back in May he thought the patient was taking his medications at the time included amlodipine, losartan and metoprolol.  Is unclear why he is no longer taking his amlodipine. ED Course: The ED and MRI and CT scans of the neck were obtained.  MRI showed inferior right caudate head acute ischemia.  Also noted to have possible urinary tract infection based on urinalysis with nitrite and blood in his urine.  Will be admitted into the hospital for further evaluation and management of stroke and treatment of mild UTI.  Culture is pending.  Review of Systems: As per HPI otherwise all other systems reviewed and   negative.    Past Medical History:  Diagnosis Date  . Decreased hearing    Left  . Diabetes mellitus 2009   adult onset  . Glomerulonephritis 12/2008   bx provem membranous GN, sees renal q year  . Grave's disease    reportedly, history of i nthe past u/s 6/11 slightly enlarged gland w/o nodules  . H/O: hematuria    saw urology 3/09, neg w/u  . Hyperlipemia   . Hypertension   . Secondary hyperparathyroidism, renal (HCC)    Dr. [name]  . Urinary retention 01/2009   was seen at the ER and followup by urology    Past Surgical History:  Procedure Laterality Date  . right knee surgery     around 2015    Social History   Social History Narrative   Original from [place], moved to [place] in the [place] , lives w/ wife   Works as a Psychologist, occupational  reports that he quit smoking about 28 years ago. He smoked 1.00 pack per day. He has never used smokeless tobacco. He reports that he does not drink alcohol or use drugs.  Allergies  Allergen Reactions  . Penicillins     Unsure if this drug was the cause of rash    Family History  Problem Relation Age of Onset  . Diabetes Other        GM  . Diabetes Maternal Grandmother   . Hypertension Maternal Grandmother   . Healthy Mother   . Heart attack  Neg Hx   . Colon cancer Neg Hx   . Prostate cancer Neg Hx      Prior to Admission medications   Medication Sig Start Date End Date Taking? Authorizing Provider  atorvastatin (LIPITOR) 80 MG tablet Take 1 tablet (80 mg total) by mouth daily. 05/01/17  Yes Paz, Nolon Rod, MD  cyclobenzaprine (FLEXERIL) 5 MG tablet Take 1 tablet (5 mg total) by mouth 2 (two) times daily as needed for muscle spasms. 02/11/18  Yes Myrlene Broker, MD  diclofenac sodium (VOLTAREN) 1 % GEL Apply 2 g topically 3 (three) times daily as needed (pain). 02/11/18  Yes Myrlene Broker, MD  losartan (COZAAR) 100 MG tablet Take 1 tablet (100 mg total) by mouth daily. 05/01/17  Yes [name], MD  metFORMIN (GLUCOPHAGE) 850 MG  tablet Take 1 tablet (850 mg total) by mouth 2 (two) times daily with a meal. 08/03/17  Yes Paz, Nolon Rod, MD  metoprolol tartrate (LOPRESSOR) 25 MG tablet Take 0.5 tablets (12.5 mg total) by mouth 2 (two) times daily. 02/20/17 02/13/26 Yes Azalee Course, PA  nitroGLYCERIN (NITROSTAT) 0.4 MG SL tablet Place 1 tablet (0.4 mg total) under the tongue every 5 (five) minutes as needed for chest pain. 02/20/17 02/13/25 Yes Azalee Course, PA  pioglitazone (ACTOS) 30 MG tablet Take 1 tablet (30 mg total) by mouth daily. 11/09/17  Yes Paz, Elita Quick E, MD  glucose blood (FREESTYLE INSULINX TEST) test strip Test blood sugar once daily. Dx code: E11.9 06/20/16   [name], MD  Lancets (FREESTYLE) lancets Check blood sugar once daily 06/20/16   [name], MD    Physical Exam:  Constitutional: NAD, calm, comfortable Vitals:   02/13/18 0415 02/13/18 0515 02/13/18 0545 02/13/18 0719  BP: (!) 178/96 (!) 152/96 (!) 163/107 (!) 180/96  Pulse: 70 65 67 65  Resp: 20 14 18 15   Temp:      TempSrc:      SpO2: 99% 98% 99% 99%  Weight:      Height:       Eyes: PERRL, lids and conjunctivae normal ENMT: Mucous membranes are moist. Posterior pharynx clear of any exudate or lesions.Normal dentition.  Neck: normal, supple, no masses, no thyromegaly Respiratory: clear to auscultation bilaterally, no wheezing, no crackles. Normal respiratory effort. No accessory muscle use.  Cardiovascular: Regular rate and rhythm, no murmurs / rubs / gallops. No extremity edema. 2+ pedal pulses. No carotid bruits.  Abdomen: no tenderness, no masses palpated. No hepatosplenomegaly. Bowel sounds positive.  Musculoskeletal: no clubbing / cyanosis. No joint deformity upper and lower extremities. Good ROM, no contractures. Normal muscle tone.  Skin: no rashes, lesions, ulcers. No induration Neurologic: CN 2-12 grossly intact. Sensation intact, DTR normal. Strength 5/5 in all 4.  Speech is slightly slurred and the patient feels like he is getting phlegm in  the back of his throat.  Passed swallow screen. Psychiatric: Normal judgment and insight. Alert and oriented x 3. Normal mood.    Labs on Admission: I have personally reviewed following labs and imaging studies  CBC: Recent Labs  Lab 02/13/18 0417  WBC 6.0  NEUTROABS 4.0  HGB 14.2  HCT 43.2  MCV 88.3  PLT 147*   Basic Metabolic Panel: Recent Labs  Lab 02/13/18 0417  NA 140  K 3.5  CL 107  CO2 24  GLUCOSE 145*  BUN 20  CREATININE 1.18  CALCIUM 8.5*   GFR: Estimated Creatinine Clearance: 81.6 mL/min (by C-G formula based  on SCr of 1.18 mg/dL). Liver Function Tests: Recent Labs  Lab 02/13/18 0417  AST 19  ALT 20  ALKPHOS 78  BILITOT 1.2  PROT 6.2*  ALBUMIN 3.3*   Coagulation Profile: Recent Labs  Lab 02/13/18 0417  INR 0.98   CBG: Recent Labs  Lab 02/13/18 0150  GLUCAP 132*   Urine analysis:    Component Value Date/Time   COLORURINE YELLOW 02/13/2018 0355   APPEARANCEUR HAZY (A) 02/13/2018 0355   LABSPEC 1.014 02/13/2018 0355   PHURINE 7.0 02/13/2018 0355   GLUCOSEU 50 (A) 02/13/2018 0355   HGBUR MODERATE (A) 02/13/2018 0355   HGBUR large 08/30/2007 1107   BILIRUBINUR NEGATIVE 02/13/2018 0355   BILIRUBINUR neg 11/26/2012 1122   KETONESUR NEGATIVE 02/13/2018 0355   PROTEINUR 30 (A) 02/13/2018 0355   UROBILINOGEN 0.2 11/26/2012 1122   UROBILINOGEN 1.0 01/17/2009 0743   NITRITE POSITIVE (A) 02/13/2018 0355   LEUKOCYTESUR NEGATIVE 02/13/2018 0355    Radiological Exams on Admission: Ct Head Wo Contrast  Result Date: 02/13/2018 CLINICAL DATA:  Acute onset of neck pain. Posterior headache. Question of cerebrovascular accident. EXAM: CT HEAD WITHOUT CONTRAST CT CERVICAL SPINE WITHOUT CONTRAST TECHNIQUE: Multidetector CT imaging of the head and cervical spine was performed following the standard protocol without intravenous contrast. Multiplanar CT image reconstructions of the cervical spine were also generated. COMPARISON:  Cervical spine radiographs  performed 04/03/2015 FINDINGS: CT HEAD FINDINGS Brain: No evidence of acute infarction, hemorrhage, hydrocephalus, extra-axial collection or mass lesion / mass effect. Prominence of the sulci reflects mild cortical volume loss. The brainstem and fourth ventricle are within normal limits. The basal ganglia are unremarkable in appearance. The cerebral hemispheres demonstrate grossly normal gray-white differentiation. No mass effect or midline shift is seen. Vascular: No hyperdense vessel or unexpected calcification. Skull: There is no evidence of fracture; visualized osseous structures are unremarkable in appearance. Sinuses/Orbits: The orbits are within normal limits. Mild mucosal thickening is noted at the right maxillary sinus and sphenoid sinus. The remaining paranasal sinuses and mastoid air cells are well-aerated. Other: No significant soft tissue abnormalities are seen. CT CERVICAL SPINE FINDINGS Alignment: Normal. Skull base and vertebrae: No acute fracture. No primary bone lesion or focal pathologic process. Soft tissues and spinal canal: No prevertebral fluid or swelling. No visible canal hematoma. Disc levels: Intervertebral disc spaces are preserved. Scattered anterior and posterior disc osteophyte complexes are noted along the lower cervical spine. Upper chest: Minimal atelectasis is noted at the lung apices. Mild calcification is noted at the carotid bifurcations bilaterally. The thyroid gland is unremarkable. Other: No additional soft tissue abnormalities are seen. IMPRESSION: 1. No evidence of traumatic intracranial injury or fracture. 2. No evidence of fracture or subluxation along the cervical spine. 3. Mild cortical volume loss noted. 4. Mild mucosal thickening at the right maxillary sinus and sphenoid sinus. 5. Mild calcification at the carotid bifurcations bilaterally. Electronically Signed   By: Roanna Raider M.D.   On: 02/13/2018 03:44   Ct Cervical Spine Wo Contrast  Result Date:  02/13/2018 CLINICAL DATA:  Acute onset of neck pain. Posterior headache. Question of cerebrovascular accident. EXAM: CT HEAD WITHOUT CONTRAST CT CERVICAL SPINE WITHOUT CONTRAST TECHNIQUE: Multidetector CT imaging of the head and cervical spine was performed following the standard protocol without intravenous contrast. Multiplanar CT image reconstructions of the cervical spine were also generated. COMPARISON:  Cervical spine radiographs performed 04/03/2015 FINDINGS: CT HEAD FINDINGS Brain: No evidence of acute infarction, hemorrhage, hydrocephalus, extra-axial collection or mass lesion / mass  effect. Prominence of the sulci reflects mild cortical volume loss. The brainstem and fourth ventricle are within normal limits. The basal ganglia are unremarkable in appearance. The cerebral hemispheres demonstrate grossly normal gray-white differentiation. No mass effect or midline shift is seen. Vascular: No hyperdense vessel or unexpected calcification. Skull: There is no evidence of fracture; visualized osseous structures are unremarkable in appearance. Sinuses/Orbits: The orbits are within normal limits. Mild mucosal thickening is noted at the right maxillary sinus and sphenoid sinus. The remaining paranasal sinuses and mastoid air cells are well-aerated. Other: No significant soft tissue abnormalities are seen. CT CERVICAL SPINE FINDINGS Alignment: Normal. Skull base and vertebrae: No acute fracture. No primary bone lesion or focal pathologic process. Soft tissues and spinal canal: No prevertebral fluid or swelling. No visible canal hematoma. Disc levels: Intervertebral disc spaces are preserved. Scattered anterior and posterior disc osteophyte complexes are noted along the lower cervical spine. Upper chest: Minimal atelectasis is noted at the lung apices. Mild calcification is noted at the carotid bifurcations bilaterally. The thyroid gland is unremarkable. Other: No additional soft tissue abnormalities are seen.  IMPRESSION: 1. No evidence of traumatic intracranial injury or fracture. 2. No evidence of fracture or subluxation along the cervical spine. 3. Mild cortical volume loss noted. 4. Mild mucosal thickening at the right maxillary sinus and sphenoid sinus. 5. Mild calcification at the carotid bifurcations bilaterally. Electronically Signed   By: Roanna Raider M.D.   On: 02/13/2018 03:44   Mr Brain Wo Contrast  Result Date: 02/13/2018 CLINICAL DATA:  Occipital head pain.  No trauma. EXAM: MRI HEAD WITHOUT CONTRAST TECHNIQUE: Multiplanar, multiecho pulse sequences of the brain and surrounding structures were obtained without intravenous contrast. COMPARISON:  Head CT 02/13/2018 FINDINGS: BRAIN: Small, 3 mm focus of acute ischemia at the inferior aspect of the right caudate head. No other diffusion abnormality. The midline structures are normal. There are no old infarcts. Minimal white matter hyperintensity, nonspecific and commonly seen in asymptomatic patients of this age. The CSF spaces are normal for age, with no hydrocephalus. Susceptibility-sensitive sequences show no chronic microhemorrhage or superficial siderosis. VASCULAR: Major intracranial arterial and venous sinus flow voids are preserved. SKULL AND UPPER CERVICAL SPINE: The visualized skull base, calvarium, upper cervical spine and extracranial soft tissues are normal. SINUSES/ORBITS: Mild ethmoid sinus mucosal thickening. The orbits are normal. IMPRESSION: Punctate focus of acute ischemia at the inferior aspect of the right caudate head. No hemorrhage or mass effect. Electronically Signed   By: Deatra Robinson M.D.   On: 02/13/2018 07:01    EKG: Independently reviewed.  This rhythm with incomplete right bundle branch block and possible old inferior infarction age-indeterminate but does appear old.  Assessment/Plan Active Problems:   Acute CVA (cerebrovascular accident) (HCC)   Acute lower UTI   DM II (diabetes mellitus, type II), controlled  (HCC)   Hyperlipidemia   HTN (hypertension)   CAD (coronary artery disease)   Urinary retention   Membranous glomerulonephritis   Obesity, diabetes, and hypertension syndrome (HCC)    1.  Acute cerebrovascular accident with ischemia in the inferior right caudate head: Neurology has been consulted.  Cardiogram is not ordered as patient is in sinus rhythm.  Will monitor telemetry if he has episodes of atrial fibrillation would reconsider that.  Patient in the past had been on Plavix this was discontinued for an unknown reason.  He will receive aspirin 325 mg p.o. daily.  Lipitor 80 mg p.o. daily.  Consult PT OT and ST.  Did  pass his initial swallow screen.  But will need speech and language evaluation for cognition etc.  He will be admitted to the telemetry unit and receive frequent neuro checks as well.  Will allow for permissive hypertension to allow for perfusion of the penumbra.  2.  Acute lower urinary tract infection: Ceftriaxone has been started and continued.  Await urine culture results.  3.  Diabetes type 2 currently controlled: Close only slightly elevated.  We will continue to monitor.  4.  Hyperlipidemia: Continue current statin which is Lipitor 80 p.o. daily.  5.  Hypertension: Hold off on restarting Cozaar and metoprolol to allow for perfusion of the penumbra.  Would consider restarting patient's amlodipine at discharge.  As his blood pressures last week were elevated and they were elevated on presentation.  6.  Coronary artery disease: Was on Plavix in the past this was discontinued we will continue aspirin at discharge and continue beta-blocker as well.  7.  Urinary retention: We will check bladder scans and ensure the patient is emptying bladder properly as this may be a cause of his urinary tract infection.  8.  Membranous glomerulonephritis: Patient has a history of this but is currently stable.  9.  Obesity diabetes and hypertension syndrome: Noted.  Patient encouraged to  lose weight.   DVT prophylaxis: Subcu Lovenox Code Status: Full code Family Communication: Spoke with patient's wife who is present at admission. Disposition Plan: Likely home in 24 to 48 hours Consults called: Neurology Dr. Amada JupiterKirkpatrick Admission status: Observation   Lahoma Crockerheresa C Shaylynne Lunt MD FACP [facility] Pager [phone]  If 7PM-7AM, please contact night-coverage [email or web address] Password [email or web address]  02/13/2018, 8:51 AM

## 2018-02-13 NOTE — Evaluation (Signed)
Clinical/Bedside Swallow Evaluation Patient Details  Name: Calvin LimboJorge Hargens Sr. MRN: [id number] Date of Birth: April 22, 1956  Today's Date: 02/13/2018 Time: SLP Start Time (ACUTE ONLY): 1210 SLP Stop Time (ACUTE ONLY): 1225 SLP Time Calculation (min) (ACUTE ONLY): 15 min  Past Medical History:  Past Medical History:  Diagnosis Date  . Decreased hearing    Left  . Diabetes mellitus 2009   adult onset  . Glomerulonephritis 12/2008   bx provem membranous GN, sees renal q year  . Grave's disease    reportedly, history of i nthe past u/s 6/11 slightly enlarged gland w/o nodules  . H/O: hematuria    saw urology 3/09, neg w/u  . Hyperlipemia   . Hypertension   . Secondary hyperparathyroidism, renal (HCC)    Dr. Hyman HopesWebb  . Urinary retention 01/2009   was seen at the ER and followup by urology   Past Surgical History:  Past Surgical History:  Procedure Laterality Date  . right knee surgery     around 2015   HPI:  62 [facility] pain, slurred speech and headache = 36 hours.  PMH + for Grave's disease, prior smoker - 1990 quit.  Imaging study showed 3 mm focus of acute ischemia at the inferior aspect of right caudate cva.   Pt and    Assessment / Plan / Recommendation Clinical Impression  Pt presents with functional oropharyngeal swallow ability based on clinical swallow evaluation.  He readily passed 3 ounce water test without difficulties.  Swallow appeared swift and strong without indications of residuals.  Pt admits to having dysphagia yesterday but states it has completely resolved.  Wife Calvin Williams present and confirms pt's report of resolution of dysphagia and dysarthria.  Recommend continue regular/thin diet - NO SLP follow up indicated.  If MD desires to rule out overt silent aspiration, recommend MBS.  Thanks.  SLP Visit Diagnosis: Dysphagia, unspecified (R13.10)    Aspiration Risk    mild   Diet Recommendation Regular;Thin liquid   Liquid Administration  via: Cup;Straw Medication Administration: Whole meds with liquid Supervision: Patient able to self feed Compensations: Slow rate;Small sips/bites    Other  Recommendations Oral Care Recommendations: Oral care BID   Follow up Recommendations None      Frequency and Duration   n/a         Prognosis   n/a     Swallow Study   General Date of Onset: 02/13/18 HPI: 62 [facility], slurred speech and headache = 36 hours.  PMH + for Grave's disease, prior smoker - 1990 quit.  Imaging study showed 3 mm focus of acute ischemia at the inferior aspect of right caudate cva.   Pt and  Type of Study: Bedside Swallow Evaluation Diet Prior to this Study: Regular;Thin liquids Temperature Spikes Noted: No Respiratory Status: Room air History of Recent Intubation: No Behavior/Cognition: Alert;Cooperative;Pleasant mood Oral Cavity Assessment: Within Functional Limits Oral Care Completed by SLP: Yes Oral Cavity - Dentition: Adequate natural dentition Vision: Functional for self-feeding Self-Feeding Abilities: Able to feed self Patient Positioning: Upright in bed Baseline Vocal Quality: Normal Volitional Cough: Strong Volitional Swallow: Able to elicit    Oral/Motor/Sensory Function Overall Oral Motor/Sensory Function: Within functional limits   Ice Chips Ice chips: Not tested   Thin Liquid Thin Liquid: Within functional limits Presentation: Cup    Nectar Thick Nectar Thick Liquid: Not tested   Honey Thick Honey Thick Liquid: Not tested  Puree Puree: Not tested   Solid     Solid: Within functional limits Presentation: Self Orvan July 02/13/2018,1:03 PM Donavan Burnet, MS [facility] SLP [phone]

## 2018-02-13 NOTE — Progress Notes (Signed)
Notified Dr Willette PaSheehan of pt's BP. Per telephone call MD states she will change the notify order and pt's BP is stable for now. No further orders. Pt resting well, family at bedside.

## 2018-02-13 NOTE — Consult Note (Addendum)
NEURO HOSPITALIST      Requesting Physician: Dr. Denton Lank    Chief Complaint: slurred speech   History obtained from:  Patient &  Chart review  HPI:                                                                                                                                         Calvin Veal Sr. is an 62 y.o. male with a PMH of HTN, CAD, DM type 2(on oral hypoglycemic agents), HLD, baseline decreased hearing, remote tobacco use(1990), glomerulonephritis and graves' disease. Patient is spanish speaking as primary language and english as second language. Interpretation is not required.  He presented to the outpatient office 2 days ago on 8/8 for neck pain, stiffness which was suspected to be a muscular injury or sprain. At that time there was no review of neurological abnormalities noted in his assessment. He was started on Voltaren gel and Flexeril without significant relief and improvement. He presents once again on 02/12/18 with complaints of continued neck pain now also associated with headache for approximately 36hrs, and slurred speech that presented after about 24 hrs of previous symptom onset. Patient's wife notice that his speech appeared "choppy and slurred when compared to his usual." Patient's wife states that her and her husband ate breakfast earlier yesterday morning without complaint speech abnormality, or trouble swallowing. Wife noticed that when she returned from work her husbands speech was impaired and become progressively worse, and her husband was now unable to swallow or to lay down and move his neck with nuchal rigidity prompting her to bring him to the ER.    During ER admission patient was noted to display mildly dysarthric speech pattern. Work-up included a U/A which displayed evidence of urinary tract infection in which patient began treatment and MRI to r/o stroke and CT cervical spine for injury examination.   MRI brain w/o   revealed:Punctate focus of acute ischemia at the inferior aspect of the right caudate head.   CT cervical displayed no evidenceof traumatic intracranial injury or fracture, fracture or subluxation along the cervical spine, mild cortical volume loss noted. Mild mucosal thickening at the right maxillary sinus and sphenoid sinus.Mild calcification at the carotid bifurcations bilaterally  Last known normal: greater than 24 hr  Presenting complaints in detail: speech abnormality  Any scans, Blood pressure: on admission 178/96, 180/96 this am.  TPA : No- Patient outside of time window  NIHSS:1  Family history included Maternal GM with diabetes, HTN  Past Medical History:  Diagnosis Date  . Decreased hearing    Left  . Diabetes mellitus 2009   adult onset  .  Glomerulonephritis 12/2008   bx provem membranous GN, sees renal q year  . Grave's disease    reportedly, history of i nthe past u/s 6/11 slightly enlarged gland w/o nodules  . H/O: hematuria    saw urology 3/09, neg w/u  . Hyperlipemia   . Hypertension   . Secondary hyperparathyroidism, renal (HCC)    Dr. [name]  . Urinary retention 01/2009   was seen at the ER and followup by urology    Past Surgical History:  Procedure Laterality Date  . right knee surgery     around 2015    Family History  Problem Relation Age of Onset  . Diabetes Other        GM  . Diabetes Maternal Grandmother   . Hypertension Maternal Grandmother   . Healthy Mother   . Heart attack Neg Hx   . Colon cancer Neg Hx   . Prostate cancer Neg Hx          Social History:  reports that he quit smoking about 28 years ago. He smoked 1.00 pack per day. He has never used smokeless tobacco. He reports that he does not drink alcohol or use drugs.  Allergies:  Allergies  Allergen Reactions  . Penicillins     Unsure if this drug was the cause of rash    Medications:                                                                                                                            Scheduled: .  stroke: mapping our early stages of recovery book   Does not apply Once  . aspirin  325 mg Oral Daily  . atorvastatin  80 mg Oral Daily  . enoxaparin (LOVENOX) injection  40 mg Subcutaneous Q24H  . insulin aspart  0-20 Units Subcutaneous TID WC  . pioglitazone  30 mg Oral Daily   Continuous: . sodium chloride     NWG:[id number] **OR** acetaminophen (TYLENOL) oral liquid 160 mg/5 mL **OR** acetaminophen, cyclobenzaprine, diclofenac sodium, nitroGLYCERIN, senna-docusate  ROS:                                                                                                                                       History obtained from spouse, chart review and the patient  General ROS: negative for - chills, fatigue, fever, night sweats, weight gain or  weight loss Psychological ROS: negative for - , hallucinations, memory difficulties, mood swings or  Ophthalmic ROS: negative for - blurry vision, double vision, eye pain or loss of vision ENT ROS: negative for - epistaxis, nasal discharge, oral lesions, sore throat, tinnitus or vertigo Respiratory ROS: negative for - cough,  shortness of breath or wheezing Cardiovascular ROS: negative for - chest pain, dyspnea on exertion,  Gastrointestinal ROS: negative for - abdominal pain, diarrhea,  nausea/vomiting or stool incontinence Genito-Urinary ROS: negative for - dysuria, hematuria, incontinence or urinary frequency/urgency Musculoskeletal ROS: Positive for neck stiffness and posterior pain 5-10 Neurological ROS: dysarthria and muffled tone  Patient's wife states that speech has improved to about 80% out of 100%  General Examination:                                                                                                      Blood pressure (!) 180/96, pulse 65, temperature 98.4 F (36.9 C), temperature source Rectal, resp. rate 15, height 6\' 1"  (1.854 m), weight 105.2 kg, SpO2 99 %. GAD-  Appropriate, call and cooperative  HEENT-  Normocephalic, no lesions, without obvious abnormality.  Normal external eye and conjunctiva.  Cardiovascular- S1-S2 audible, pulses palpable throughout   Lungs-no rhonchi or wheezing noted, no excessive working breathing.  Saturations within normal limits Abdomen- All 4 quadrants palpated and nontender Extremities- Warm, dry and intact Musculoskeletal-Neck tenderness and pain Skin-warm and dry, no hyperpigmentation, vitiligo, or suspicious lesions  Neurological Examination Mental Status: Alert, oriented, thought content appropriate.  Speech mildly dysarthric with muffled tone .  Able to follow 3 step commands without difficulty. Cranial Nerves: II: Discs flat bilaterally; Visual fields grossly normal,  III,IV, VI: ptosis not present, extra-ocular motions intact bilaterally, pupils equal, round, reactive to light and accommodation V,VII: smile symmetric, facial light touch sensation normal bilaterally VIII: hearing normal bilaterally with baseline decreased tone  IX,X: uvula rises symmetrically XI: bilateral shoulder shrug XII: midline tongue extension Motor: Right : Upper extremity   5/5    Left:     Upper extremity   5/5  Lower extremity   5/5     Lower extremity   5/5 Tone and bulk:normal tone throughout; no atrophy noted Sensory: Pinprick and light touch intact throughout, bilaterally Deep Tendon Reflexes: 2+ and symmetric throughout Plantars: Right: downgoing   Left: downgoing Cerebellar: normal finger-to-nose, normal rapid alternating movements and normal heel-to-shin test Gait: Deferred    Lab Results: Basic Metabolic Panel: Recent Labs  Lab 02/13/18 0417  NA 140  K 3.5  CL 107  CO2 24  GLUCOSE 145*  BUN 20  CREATININE 1.18  CALCIUM 8.5*    CBC: Recent Labs  Lab 02/13/18 0417  WBC 6.0  NEUTROABS 4.0  HGB 14.2  HCT 43.2  MCV 88.3  PLT 147*    Lipid Panel: No results for input(s): CHOL, TRIG, HDL, CHOLHDL,  VLDL, LDLCALC in the last 168 hours.  CBG: Recent Labs  Lab 02/13/18 0150  GLUCAP 132*    Imaging: Ct Head Wo Contrast  Result Date: 02/13/2018 CLINICAL DATA:  Acute onset of neck pain. Posterior headache. Question of cerebrovascular accident. EXAM: CT HEAD WITHOUT CONTRAST CT CERVICAL SPINE WITHOUT CONTRAST TECHNIQUE: Multidetector CT imaging of the head and cervical spine was performed following the standard protocol without intravenous contrast. Multiplanar CT image reconstructions of the cervical spine were also generated. COMPARISON:  Cervical spine radiographs performed 04/03/2015 FINDINGS: CT HEAD FINDINGS Brain: No evidence of acute infarction, hemorrhage, hydrocephalus, extra-axial collection or mass lesion / mass effect. Prominence of the sulci reflects mild cortical volume loss. The brainstem and fourth ventricle are within normal limits. The basal ganglia are unremarkable in appearance. The cerebral hemispheres demonstrate grossly normal gray-white differentiation. No mass effect or midline shift is seen. Vascular: No hyperdense vessel or unexpected calcification. Skull: There is no evidence of fracture; visualized osseous structures are unremarkable in appearance. Sinuses/Orbits: The orbits are within normal limits. Mild mucosal thickening is noted at the right maxillary sinus and sphenoid sinus. The remaining paranasal sinuses and mastoid air cells are well-aerated. Other: No significant soft tissue abnormalities are seen. CT CERVICAL SPINE FINDINGS Alignment: Normal. Skull base and vertebrae: No acute fracture. No primary bone lesion or focal pathologic process. Soft tissues and spinal canal: No prevertebral fluid or swelling. No visible canal hematoma. Disc levels: Intervertebral disc spaces are preserved. Scattered anterior and posterior disc osteophyte complexes are noted along the lower cervical spine. Upper chest: Minimal atelectasis is noted at the lung apices. Mild calcification is  noted at the carotid bifurcations bilaterally. The thyroid gland is unremarkable. Other: No additional soft tissue abnormalities are seen. IMPRESSION: 1. No evidence of traumatic intracranial injury or fracture. 2. No evidence of fracture or subluxation along the cervical spine. 3. Mild cortical volume loss noted. 4. Mild mucosal thickening at the right maxillary sinus and sphenoid sinus. 5. Mild calcification at the carotid bifurcations bilaterally. Electronically Signed   By: Roanna Raider M.D.   On: 02/13/2018 03:44   Ct Cervical Spine Wo Contrast  Result Date: 02/13/2018 CLINICAL DATA:  Acute onset of neck pain. Posterior headache. Question of cerebrovascular accident. EXAM: CT HEAD WITHOUT CONTRAST CT CERVICAL SPINE WITHOUT CONTRAST TECHNIQUE: Multidetector CT imaging of the head and cervical spine was performed following the standard protocol without intravenous contrast. Multiplanar CT image reconstructions of the cervical spine were also generated. COMPARISON:  Cervical spine radiographs performed 04/03/2015 FINDINGS: CT HEAD FINDINGS Brain: No evidence of acute infarction, hemorrhage, hydrocephalus, extra-axial collection or mass lesion / mass effect. Prominence of the sulci reflects mild cortical volume loss. The brainstem and fourth ventricle are within normal limits. The basal ganglia are unremarkable in appearance. The cerebral hemispheres demonstrate grossly normal gray-white differentiation. No mass effect or midline shift is seen. Vascular: No hyperdense vessel or unexpected calcification. Skull: There is no evidence of fracture; visualized osseous structures are unremarkable in appearance. Sinuses/Orbits: The orbits are within normal limits. Mild mucosal thickening is noted at the right maxillary sinus and sphenoid sinus. The remaining paranasal sinuses and mastoid air cells are well-aerated. Other: No significant soft tissue abnormalities are seen. CT CERVICAL SPINE FINDINGS Alignment: Normal.  Skull base and vertebrae: No acute fracture. No primary bone lesion or focal pathologic process. Soft tissues and spinal canal: No prevertebral fluid or swelling. No visible canal hematoma. Disc levels: Intervertebral disc spaces are preserved. Scattered anterior and posterior disc osteophyte complexes are noted along the lower cervical spine. Upper chest: Minimal atelectasis is noted at the lung apices. Mild calcification is noted at the carotid bifurcations bilaterally. The thyroid gland is  unremarkable. Other: No additional soft tissue abnormalities are seen. IMPRESSION: 1. No evidence of traumatic intracranial injury or fracture. 2. No evidence of fracture or subluxation along the cervical spine. 3. Mild cortical volume loss noted. 4. Mild mucosal thickening at the right maxillary sinus and sphenoid sinus. 5. Mild calcification at the carotid bifurcations bilaterally. Electronically Signed   By: Roanna Raider M.D.   On: 02/13/2018 03:44   Mr Brain Wo Contrast  Result Date: 02/13/2018 CLINICAL DATA:  Occipital head pain.  No trauma. EXAM: MRI HEAD WITHOUT CONTRAST TECHNIQUE: Multiplanar, multiecho pulse sequences of the brain and surrounding structures were obtained without intravenous contrast. COMPARISON:  Head CT 02/13/2018 FINDINGS: BRAIN: Small, 3 mm focus of acute ischemia at the inferior aspect of the right caudate head. No other diffusion abnormality. The midline structures are normal. There are no old infarcts. Minimal white matter hyperintensity, nonspecific and commonly seen in asymptomatic patients of this age. The CSF spaces are normal for age, with no hydrocephalus. Susceptibility-sensitive sequences show no chronic microhemorrhage or superficial siderosis. VASCULAR: Major intracranial arterial and venous sinus flow voids are preserved. SKULL AND UPPER CERVICAL SPINE: The visualized skull base, calvarium, upper cervical spine and extracranial soft tissues are normal. SINUSES/ORBITS: Mild  ethmoid sinus mucosal thickening. The orbits are normal. IMPRESSION: Punctate focus of acute ischemia at the inferior aspect of the right caudate head. No hemorrhage or mass effect. Electronically Signed   By: Deatra Robinson M.D.   On: 02/13/2018 07:01      [facility] [phone]  02/13/2018, 8:15 AM  Attending physician note to follow with any further rec's for assessment and plan .  Assessment: 62 y.o. male Calvin Salceda Sr. is an 62 y.o. male with a PMH of HTN, CAD, DM type 2(on oral hypoglycemic agents), HLD, baseline decreased hearing, remote tobacco use(1990), glomerulonephritis and graves' disease presents for the second time within 2 days for complaints of continued neck pain now also associated with headache for approximately 36hrs, and slurred speech that presented after about 24 hrs of previous symptom onset. MRI revealed Punctate focus of acute ischemia at the inferior aspect of the right caudate head. CT cervical spine Mild calcification at the carotid bifurcations bilaterally  Stroke: Punctate focus of acute ischemia at the inferior aspect of the right caudate head. Etiology : Likely small vessel disease with uncontrolled risk factors   Stroke Risk Factors - diabetes mellitus, hyperlipidemia and hypertension HTN- checks 1x weekly at home BP generally low 130's high 150's ?OSA- independent risk factor. Patient snores loudly at night per wife never been diagnosed but would benefit screening also previous smoker  HLD- appears improved on last check but with elevated triglycerides on statin at home  DM- currently on oral hypoglycemic agents at home   Recommend -- BP goal : 120-150- normalize slowly. Ok to have oral antihypertensives. avoid hypotension   -- MRA head and neck for vessel studies with evidence of carotid calcifications- hx of glomerulonephritis- Cr normal 1.18   -- 2D Echocardiogram -- Prophylactic therapy-Antiplatelet med ASA 325 previously has taken 81 mg  but stopped over a month ago  -- Continue High intensity Statin 80 mg po hs  -- HgbA1c pending collection last resulted 12/04/17-7.8  , fasting lipid panel pending collection last resulted 04/2017- LDL-C 76, Triglycerides 154 -- PT consult, OT consult, Speech consult --Telemetry monitoring --Frequent neuro checks --Stroke swallow screen ( NPO until official swallow screen if you think patient will fail)- patient passed ER swallow. Concern for current  muffled tone. Would like speech to see earlier than later to prevent any potential for silent aspiration  - Cont medical management   --please page stroke NP  Or  PA  Or MD from 8am -4 pm  as this patient from this time will be  followed by the stroke.   You can look them up on [email or web address]  Password [email or web address]

## 2018-02-13 NOTE — ED Notes (Signed)
EDP Elizabeth at bedside

## 2018-02-13 NOTE — ED Provider Notes (Signed)
Pt signed out to me at shift change pending MR head. Pt with neck pain and slurred speech for 36 hrs.  If MR negative, home with treatment for UTI  7:15 AM MRI resulted with punctate acute infarct in the right caudate head.  I went and reassessed patient.  He continues to have some slurred speech.  There is no other neurological deficits.  He is still complaining of some pain in his neck.  Will page neurology for consult and admit to medicine for further evaluation and treatment.  Spoke with Dr. Amada JupiterKirkpatrick with neurology, will consult Spoke with [facility], will admit.   Vitals:   02/13/18 0415 02/13/18 0515 02/13/18 0545 02/13/18 0719  BP: (!) 178/96 (!) 152/96 (!) 163/107 (!) 180/96  Pulse: 70 65 67 65  Resp: 20 14 18 15   Temp:      TempSrc:      SpO2: 99% 98% 99% 99%  Weight:      Height:          Jaynie CrumbleKirichenko, Keisha Amer, PA-C 02/13/18 0814    Dione BoozeGlick, David, MD 02/22/18 1555

## 2018-02-14 ENCOUNTER — Observation Stay (HOSPITAL_BASED_OUTPATIENT_CLINIC_OR_DEPARTMENT_OTHER): Payer: 59

## 2018-02-14 DIAGNOSIS — M5412 Radiculopathy, cervical region: Secondary | ICD-10-CM

## 2018-02-14 DIAGNOSIS — I10 Essential (primary) hypertension: Secondary | ICD-10-CM

## 2018-02-14 DIAGNOSIS — N390 Urinary tract infection, site not specified: Secondary | ICD-10-CM

## 2018-02-14 DIAGNOSIS — I639 Cerebral infarction, unspecified: Secondary | ICD-10-CM | POA: Diagnosis not present

## 2018-02-14 DIAGNOSIS — I5030 Unspecified diastolic (congestive) heart failure: Secondary | ICD-10-CM | POA: Diagnosis not present

## 2018-02-14 DIAGNOSIS — E118 Type 2 diabetes mellitus with unspecified complications: Secondary | ICD-10-CM

## 2018-02-14 LAB — HIV ANTIBODY (ROUTINE TESTING W REFLEX): HIV Screen 4th Generation wRfx: NONREACTIVE

## 2018-02-14 LAB — GLUCOSE, CAPILLARY
Glucose-Capillary: 121 mg/dL — ABNORMAL HIGH (ref 70–99)
Glucose-Capillary: 99 mg/dL (ref 70–99)

## 2018-02-14 LAB — LIPID PANEL
Cholesterol: 186 mg/dL (ref 0–200)
HDL: 49 mg/dL (ref >40)
LDL Cholesterol: 116 mg/dL — ABNORMAL HIGH (ref 0–99)
Total CHOL/HDL Ratio: 3.8 RATIO
Triglycerides: 106 mg/dL (ref <150)
VLDL: 21 mg/dL (ref 0–40)

## 2018-02-14 LAB — ECHOCARDIOGRAM COMPLETE
Height: 73 in
Weight: 3712 oz

## 2018-02-14 MED ORDER — CLOPIDOGREL BISULFATE 75 MG PO TABS: 75.0000 mg | ORAL_TABLET | Freq: Every day | ORAL | 0 refills | Status: AC

## 2018-02-14 MED ORDER — ASPIRIN EC 81 MG PO TBEC
81.0000 mg | DELAYED_RELEASE_TABLET | Freq: Every day | ORAL | Status: DC
  Administered 2018-02-14: 81 mg via ORAL
  Filled 2018-02-14: qty 1

## 2018-02-14 MED ORDER — CLOPIDOGREL BISULFATE 75 MG PO TABS
75.0000 mg | ORAL_TABLET | Freq: Every day | ORAL | Status: DC
  Administered 2018-02-14: 75 mg via ORAL
  Filled 2018-02-14: qty 1

## 2018-02-14 MED ORDER — CEPHALEXIN 500 MG PO CAPS: 500.0000 mg | ORAL_CAPSULE | Freq: Two times a day (BID) | ORAL | 0 refills | Status: AC

## 2018-02-14 MED ORDER — ATORVASTATIN CALCIUM 10 MG PO TABS: 20.0000 mg | ORAL_TABLET | Freq: Every evening | ORAL | Status: DC

## 2018-02-14 MED ORDER — ASPIRIN 81 MG PO TBEC: 81.0000 mg | DELAYED_RELEASE_TABLET | Freq: Every day | ORAL | 0 refills | Status: DC

## 2018-02-14 MED ORDER — ATORVASTATIN CALCIUM 80 MG PO TABS: 80.0000 mg | ORAL_TABLET | Freq: Every evening | ORAL | Status: DC

## 2018-02-14 NOTE — Progress Notes (Signed)
PT Cancellation Note  Patient Details Name: Calvin LimboJorge Grotz Sr. MRN: [id number] DOB: 20-Mar-1956   Cancelled Treatment:    Reason Eval/Treat Not Completed: PT screened, no needs identified, will sign off   Gilliam Hawkes J [name] 02/14/2018, 9:13 AM

## 2018-02-14 NOTE — Discharge Summary (Signed)
Discharge Summary  Calvin Siems Sr. ZOX:[id number] DOB: 1955-12-13  PCP: [name], MD  Admit date: 02/12/2018 Discharge date: 02/14/2018  Time spent: <48mins  Recommendations for Outpatient Follow-up:  1. F/u with PMD within a week  for hospital discharge follow up, repeat cbc/bmp at follow up 2. F/u with neurology in 4 weeks  Discharge Diagnoses:  Active Hospital Problems   Diagnosis Date Noted  . Acute CVA (cerebrovascular accident) (HCC) 02/13/2018  . Obesity, diabetes, and hypertension syndrome (HCC) 02/13/2018  . Acute lower UTI 02/13/2018  . Urinary retention 02/13/2018  . CAD (coronary artery disease) 05/03/2017  . Membranous glomerulonephritis 01/16/2009  . DM II (diabetes mellitus, type II), controlled (HCC) 10/01/2007  . Hyperlipidemia 08/23/2006  . HTN (hypertension) 08/23/2006    Resolved Hospital Problems  No resolved problems to display.    Discharge Condition: stable  Diet recommendation: heart healthy/carb modified  Filed Weights   02/12/18 2204  Weight: 105.2 kg    History of present illness: (per Admitting Dr Willette Pa) PCP: [name], MD   Patient coming from: Home  I have personally briefly reviewed patient's old medical records in [facility]  Chief Complaint: 36 hours of slurred speech and neck pain.  HPI: Calvin Hitchman Sr. is a 62 y.o. male with medical history significant of Beatties, Graves' disease, hypertension, hyperlipidemia, Leonie Douglas nephritis, secondary hyperparathyroidism who presents for evaluation of neck pain and slurred speech.  Since Wednesday he has had severe pain to the back of his neck.  He saw his primary care doctor who prescribed Voltaren gel as well as some muscle relaxers.  He has had no fever or chills at home and the pain is made worse with movement he has no headache.  His wife then reported 24 hours prior to presentation that he developed some speech changes she stated that his speech is choppy and  slurred when compared to his usual.  He denies any weakness numbness or tingling.  He was seen at his doctor's office and at that time his blood pressures were noted to be a proximally 156.  Reports taking his medications but upon review of the medication reconciliation form performed by pharmacy patient has stopped taking his amlodipine.  Need to notes from Dr. pause his primary care physician back in May he thought the patient was taking his medications at the time included amlodipine, losartan and metoprolol.  Is unclear why he is no longer taking his amlodipine. ED Course: The ED and MRI and CT scans of the neck were obtained.  MRI showed inferior right caudate head acute ischemia.  Also noted to have possible urinary tract infection based on urinalysis with nitrite and blood in his urine.  Will be admitted into the hospital for further evaluation and management of stroke and treatment of mild UTI.  Culture is pending.   Hospital Course:  Active Problems:   DM II (diabetes mellitus, type II), controlled (HCC)   Hyperlipidemia   HTN (hypertension)   Membranous glomerulonephritis   CAD (coronary artery disease)   Acute CVA (cerebrovascular accident) (HCC)   Obesity, diabetes, and hypertension syndrome (HCC)   Acute lower UTI   Urinary retention   Acute cerebrovascular accident with ischemia in the inferior right caudate head, presenting Symptom resolved at discharge Tele unremarkable,  a1c 6.7 LDL 116 Echo " Normal LV size with mild LV hypertrophy. EF 55-60%. Normal RV size and systolic function. No significant valvular   abnormalities" Neurology consulted, recommended to discharge on  plavix 75mg  daily for 3 weeks together with  asa 81mg  daily, then asa alone Continue statin F/u with neurology  HTN Continue home meds betablocker and losartan  noninsulin dependent dm2 a1c 6.7 Continue home meds    H/o CAD " had a small myocardial infarction in the basal lateral wall that is  complete by stress test in 2018" per cardiology Dr Royann Shiversroitoru Chronic incomplete RBBB on ekg Denies chest pain Outpatient follow up with pcp and cardiology  H/o Membranous glomerulonephritis stable, no acute issues  h/o grave's disease; stable, no acute issues  F/u with pcp  Questionable UTI, he denies symptom, Urine culture + for STAPHYLOCOCCUS SPECIES (COAGULASE NEGATIVE) He received rocephin in the hospital He is discharged on keflex Per chart review, he has staph coag negative in the urine culture in 2012 and 2014 as well I wonder if he is colonized with this pcp follow up Consider refer to urology if symptomatic   Neck pain:  He was seen by pcp on 8/8 for this ,neck pain got worse, he presented to the ED on 8/9. Possible muscle spasm vs radiculopathy CT cervical spine no acute issues pcp follow up   Procedures:  none  Consultations:  neruology  Discharge Exam: BP (!) 161/102 (BP Location: Left Arm) Comment: RN notified  Pulse 64   Temp (!) 97.5 F (36.4 C) (Oral)   Resp 20   Ht 6\' 1"  (1.854 m)   Wt 105.2 kg   SpO2 100%   BMI 30.61 kg/m   General: NAD Cardiovascular: RRR Respiratory: CTABL  Discharge Instructions You were cared for by a hospitalist during your hospital stay. If you have any questions about your discharge medications or the care you received while you were in the hospital after you are discharged, you can call the unit and asked to speak with the hospitalist on call if the hospitalist that took care of you is not available. Once you are discharged, your primary care physician will handle any further medical issues. Please note that NO REFILLS for any discharge medications will be authorized once you are discharged, as it is imperative that you return to your primary care physician (or establish a relationship with a primary care physician if you do not have one) for your aftercare needs so that they can reassess your need for medications and monitor  your lab values.  Discharge Instructions    Ambulatory referral to Neurology   Complete by:  As directed    Follow up with stroke clinic NP (Jessica Vanschaick or Darrol Angelarolyn Martin, if both not available, consider Manson AllanSethi, Penumali, or Ahern) [facility] in about 4 weeks. Thanks.   Diet - low sodium heart healthy   Complete by:  As directed    Carb modified   Increase activity slowly   Complete by:  As directed      Allergies as of 02/14/2018      Reactions   Penicillins    Unsure if this drug was the cause of rash      Medication List    TAKE these medications   aspirin 81 MG EC tablet Take 1 tablet (81 mg total) by mouth daily. Start taking on:  02/15/2018   atorvastatin 80 MG tablet Commonly known as:  LIPITOR Take 1 tablet (80 mg total) by mouth daily.   cephALEXin 500 MG capsule Commonly known as:  KEFLEX Take 1 capsule (500 mg total) by mouth 2 (two) times daily for 3 days.   clopidogrel 75 MG  tablet Commonly known as:  PLAVIX Take 1 tablet (75 mg total) by mouth daily for 21 days. Start taking on:  02/15/2018   cyclobenzaprine 5 MG tablet Commonly known as:  FLEXERIL Take 1 tablet (5 mg total) by mouth 2 (two) times daily as needed for muscle spasms.   diclofenac sodium 1 % Gel Commonly known as:  VOLTAREN Apply 2 g topically 3 (three) times daily as needed (pain).   freestyle lancets Check blood sugar once daily   glucose blood test strip Test blood sugar once daily. Dx code: E11.9   losartan 100 MG tablet Commonly known as:  COZAAR Take 1 tablet (100 mg total) by mouth daily.   metFORMIN 850 MG tablet Commonly known as:  GLUCOPHAGE Take 1 tablet (850 mg total) by mouth 2 (two) times daily with a meal.   metoprolol tartrate 25 MG tablet Commonly known as:  LOPRESSOR Take 0.5 tablets (12.5 mg total) by mouth 2 (two) times daily.   nitroGLYCERIN 0.4 MG SL tablet Commonly known as:  NITROSTAT Place 1 tablet (0.4 mg total) under the tongue every 5 (five)  minutes as needed for chest pain.   pioglitazone 30 MG tablet Commonly known as:  ACTOS Take 1 tablet (30 mg total) by mouth daily.      Allergies  Allergen Reactions  . Penicillins     Unsure if this drug was the cause of rash   Follow-up Information    [name], MD Follow up in 1 week(s).   Specialty:  Internal Medicine Why:  hospital discharge follow up  please check your blood pressure at home, bring in records for your doctor to review. Contact information: [address] [place] [address] [phone]        [facility] Follow up in 4 week(s).   Contact information: [address]     [address] [place] [address] [phone]           The results of significant diagnostics from this hospitalization (including imaging, microbiology, ancillary and laboratory) are listed below for reference.    Significant Diagnostic Studies: Dg Chest 2 View  Result Date: 02/13/2018 CLINICAL DATA:  Left upper chest pain. EXAM: CHEST - 2 VIEW COMPARISON:  January 02, 2009 FINDINGS: Mild atelectasis in the left base. No pneumothorax. The cardiomediastinal silhouette is stable. No pulmonary nodules, masses, or focal infiltrates. IMPRESSION: No active cardiopulmonary disease. Electronically Signed   By: Gerome Sam III M.D   On: 02/13/2018 09:34   Ct Head Wo Contrast  Result Date: 02/13/2018 CLINICAL DATA:  Acute onset of neck pain. Posterior headache. Question of cerebrovascular accident. EXAM: CT HEAD WITHOUT CONTRAST CT CERVICAL SPINE WITHOUT CONTRAST TECHNIQUE: Multidetector CT imaging of the head and cervical spine was performed following the standard protocol without intravenous contrast. Multiplanar CT image reconstructions of the cervical spine were also generated. COMPARISON:  Cervical spine radiographs performed 04/03/2015 FINDINGS: CT HEAD FINDINGS Brain: No evidence of acute infarction, hemorrhage, hydrocephalus,  extra-axial collection or mass lesion / mass effect. Prominence of the sulci reflects mild cortical volume loss. The brainstem and fourth ventricle are within normal limits. The basal ganglia are unremarkable in appearance. The cerebral hemispheres demonstrate grossly normal gray-white differentiation. No mass effect or midline shift is seen. Vascular: No hyperdense vessel or unexpected calcification. Skull: There is no evidence of fracture; visualized osseous structures are unremarkable in appearance. Sinuses/Orbits: The orbits are within normal limits. Mild mucosal thickening is noted  at the right maxillary sinus and sphenoid sinus. The remaining paranasal sinuses and mastoid air cells are well-aerated. Other: No significant soft tissue abnormalities are seen. CT CERVICAL SPINE FINDINGS Alignment: Normal. Skull base and vertebrae: No acute fracture. No primary bone lesion or focal pathologic process. Soft tissues and spinal canal: No prevertebral fluid or swelling. No visible canal hematoma. Disc levels: Intervertebral disc spaces are preserved. Scattered anterior and posterior disc osteophyte complexes are noted along the lower cervical spine. Upper chest: Minimal atelectasis is noted at the lung apices. Mild calcification is noted at the carotid bifurcations bilaterally. The thyroid gland is unremarkable. Other: No additional soft tissue abnormalities are seen. IMPRESSION: 1. No evidence of traumatic intracranial injury or fracture. 2. No evidence of fracture or subluxation along the cervical spine. 3. Mild cortical volume loss noted. 4. Mild mucosal thickening at the right maxillary sinus and sphenoid sinus. 5. Mild calcification at the carotid bifurcations bilaterally. Electronically Signed   By: Roanna Raider M.D.   On: 02/13/2018 03:44   Ct Cervical Spine Wo Contrast  Result Date: 02/13/2018 CLINICAL DATA:  Acute onset of neck pain. Posterior headache. Question of cerebrovascular accident. EXAM: CT HEAD  WITHOUT CONTRAST CT CERVICAL SPINE WITHOUT CONTRAST TECHNIQUE: Multidetector CT imaging of the head and cervical spine was performed following the standard protocol without intravenous contrast. Multiplanar CT image reconstructions of the cervical spine were also generated. COMPARISON:  Cervical spine radiographs performed 04/03/2015 FINDINGS: CT HEAD FINDINGS Brain: No evidence of acute infarction, hemorrhage, hydrocephalus, extra-axial collection or mass lesion / mass effect. Prominence of the sulci reflects mild cortical volume loss. The brainstem and fourth ventricle are within normal limits. The basal ganglia are unremarkable in appearance. The cerebral hemispheres demonstrate grossly normal gray-white differentiation. No mass effect or midline shift is seen. Vascular: No hyperdense vessel or unexpected calcification. Skull: There is no evidence of fracture; visualized osseous structures are unremarkable in appearance. Sinuses/Orbits: The orbits are within normal limits. Mild mucosal thickening is noted at the right maxillary sinus and sphenoid sinus. The remaining paranasal sinuses and mastoid air cells are well-aerated. Other: No significant soft tissue abnormalities are seen. CT CERVICAL SPINE FINDINGS Alignment: Normal. Skull base and vertebrae: No acute fracture. No primary bone lesion or focal pathologic process. Soft tissues and spinal canal: No prevertebral fluid or swelling. No visible canal hematoma. Disc levels: Intervertebral disc spaces are preserved. Scattered anterior and posterior disc osteophyte complexes are noted along the lower cervical spine. Upper chest: Minimal atelectasis is noted at the lung apices. Mild calcification is noted at the carotid bifurcations bilaterally. The thyroid gland is unremarkable. Other: No additional soft tissue abnormalities are seen. IMPRESSION: 1. No evidence of traumatic intracranial injury or fracture. 2. No evidence of fracture or subluxation along the  cervical spine. 3. Mild cortical volume loss noted. 4. Mild mucosal thickening at the right maxillary sinus and sphenoid sinus. 5. Mild calcification at the carotid bifurcations bilaterally. Electronically Signed   By: Roanna Raider M.D.   On: 02/13/2018 03:44   Mr Maxine Glenn Head Wo Contrast  Result Date: 02/13/2018 CLINICAL DATA:  Right basal ganglia acute infarct. EXAM: MRA NECK WITHOUT AND WITH CONTRAST MRA HEAD WITHOUT CONTRAST TECHNIQUE: Multiplanar and multiecho pulse sequences of the neck were obtained without and with intravenous contrast. Angiographic images of the neck were obtained using MRA technique without and with intravenous contast.; Angiographic images of the Circle of Willis were obtained using MRA technique without intravenous contrast. CONTRAST:  20mL MULTIHANCE GADOBENATE DIMEGLUMINE  529 MG/ML IV SOLN COMPARISON:  MRI brain 02/13/2018. FINDINGS: MRA NECK FINDINGS The time-of-flight images demonstrate no significant flow disturbance at either carotid artery. There is antegrade flow in the vertebral arteries bilaterally. The right common carotid artery is within normal limits bilaterally. The right carotid bifurcation is unremarkable. Cervical right ICA is normal. The left common carotid artery is within normal limits. Bifurcation is unremarkable. Cervical left ICA is normal. The vertebral arteries are codominant. Both vertebral arteries originate at the subclavian arteries without significant stenosis. There is no significant stenosis in the neck. MRA HEAD FINDINGS The internal carotid arteries are within normal limits from the high cervical segments through the ICA termini. The A1 and M1 segments are normal. The anterior communicating artery is patent. ACA and MCA branch vessels are within normal limits. The right vertebral artery is slightly dominant to the left. PICA origins are not visualized. Dominant AICA vessels are present bilaterally. Basilar artery is normal. Both posterior cerebral  arteries originate from basilar tip. Prominent posterior communicating arteries are present bilaterally. IMPRESSION: 1. Normal MRI of the neck without and with contrast. No focal stenosis or vascular lesion. 2. Normal variant MRA circle-of-Willis without significant proximal stenosis, aneurysm, or branch vessel occlusion. Electronically Signed   By: Marin Roberts M.D.   On: 02/13/2018 16:23   Mr Maxine Glenn Neck W Wo Contrast  Result Date: 02/13/2018 CLINICAL DATA:  Right basal ganglia acute infarct. EXAM: MRA NECK WITHOUT AND WITH CONTRAST MRA HEAD WITHOUT CONTRAST TECHNIQUE: Multiplanar and multiecho pulse sequences of the neck were obtained without and with intravenous contrast. Angiographic images of the neck were obtained using MRA technique without and with intravenous contast.; Angiographic images of the Circle of Willis were obtained using MRA technique without intravenous contrast. CONTRAST:  20mL MULTIHANCE GADOBENATE DIMEGLUMINE 529 MG/ML IV SOLN COMPARISON:  MRI brain 02/13/2018. FINDINGS: MRA NECK FINDINGS The time-of-flight images demonstrate no significant flow disturbance at either carotid artery. There is antegrade flow in the vertebral arteries bilaterally. The right common carotid artery is within normal limits bilaterally. The right carotid bifurcation is unremarkable. Cervical right ICA is normal. The left common carotid artery is within normal limits. Bifurcation is unremarkable. Cervical left ICA is normal. The vertebral arteries are codominant. Both vertebral arteries originate at the subclavian arteries without significant stenosis. There is no significant stenosis in the neck. MRA HEAD FINDINGS The internal carotid arteries are within normal limits from the high cervical segments through the ICA termini. The A1 and M1 segments are normal. The anterior communicating artery is patent. ACA and MCA branch vessels are within normal limits. The right vertebral artery is slightly dominant to  the left. PICA origins are not visualized. Dominant AICA vessels are present bilaterally. Basilar artery is normal. Both posterior cerebral arteries originate from basilar tip. Prominent posterior communicating arteries are present bilaterally. IMPRESSION: 1. Normal MRI of the neck without and with contrast. No focal stenosis or vascular lesion. 2. Normal variant MRA circle-of-Willis without significant proximal stenosis, aneurysm, or branch vessel occlusion. Electronically Signed   By: Marin Roberts M.D.   On: 02/13/2018 16:23   Mr Brain Wo Contrast  Result Date: 02/13/2018 CLINICAL DATA:  Occipital head pain.  No trauma. EXAM: MRI HEAD WITHOUT CONTRAST TECHNIQUE: Multiplanar, multiecho pulse sequences of the brain and surrounding structures were obtained without intravenous contrast. COMPARISON:  Head CT 02/13/2018 FINDINGS: BRAIN: Small, 3 mm focus of acute ischemia at the inferior aspect of the right caudate head. No other diffusion abnormality. The midline  structures are normal. There are no old infarcts. Minimal white matter hyperintensity, nonspecific and commonly seen in asymptomatic patients of this age. The CSF spaces are normal for age, with no hydrocephalus. Susceptibility-sensitive sequences show no chronic microhemorrhage or superficial siderosis. VASCULAR: Major intracranial arterial and venous sinus flow voids are preserved. SKULL AND UPPER CERVICAL SPINE: The visualized skull base, calvarium, upper cervical spine and extracranial soft tissues are normal. SINUSES/ORBITS: Mild ethmoid sinus mucosal thickening. The orbits are normal. IMPRESSION: Punctate focus of acute ischemia at the inferior aspect of the right caudate head. No hemorrhage or mass effect. Electronically Signed   By: Deatra Robinson M.D.   On: 02/13/2018 07:01    Microbiology: Recent Results (from the past 240 hour(s))  Urine culture     Status: Abnormal (Preliminary result)   Collection Time: 02/13/18  4:45 AM  Result  Value Ref Range Status   Specimen Description URINE, CLEAN CATCH  Final   Special Requests   Final    NONE Performed at [facility], [address], [place], [place] [address]    Culture (A)  Final    >=100,000 COLONIES/mL STAPHYLOCOCCUS SPECIES (COAGULASE NEGATIVE)   Report Status PENDING  Incomplete     Labs: Basic Metabolic Panel: Recent Labs  Lab 02/13/18 0417  NA 140  K 3.5  CL 107  CO2 24  GLUCOSE 145*  BUN 20  CREATININE 1.18  CALCIUM 8.5*   Liver Function Tests: Recent Labs  Lab 02/13/18 0417  AST 19  ALT 20  ALKPHOS 78  BILITOT 1.2  PROT 6.2*  ALBUMIN 3.3*   No results for input(s): LIPASE, AMYLASE in the last 168 hours. No results for input(s): AMMONIA in the last 168 hours. CBC: Recent Labs  Lab 02/13/18 0417  WBC 6.0  NEUTROABS 4.0  HGB 14.2  HCT 43.2  MCV 88.3  PLT 147*   Cardiac Enzymes: No results for input(s): CKTOTAL, CKMB, CKMBINDEX, TROPONINI in the last 168 hours. BNP: BNP (last 3 results) No results for input(s): BNP in the last 8760 hours.  ProBNP (last 3 results) No results for input(s): PROBNP in the last 8760 hours.  CBG: Recent Labs  Lab 02/13/18 1143 02/13/18 1631 02/13/18 2131 02/14/18 0652 02/14/18 1119  GLUCAP 118* 155* 110* 121* 99       Signed:  Albertine Grates MD, PhD  [facility] 02/14/2018, 4:53 PM

## 2018-02-14 NOTE — Progress Notes (Signed)
STROKE TEAM PROGRESS NOTE   SUBJECTIVE (INTERVAL HISTORY) His wife and friends are at the bedside.  Patient still complains of mild left-sided neck pain, however, headache and slurred speech resolved.  MRI showed right caudate head punctate infarct, incidental finding.  Other stroke work-up negative.   OBJECTIVE Temp:  [97.4 F (36.3 C)-98.7 F (37.1 C)] 97.5 F (36.4 C) (08/11 1121) Pulse Rate:  [64-77] 64 (08/11 1121) Cardiac Rhythm: Heart block (08/11 0800) Resp:  [16-22] 20 (08/11 1121) BP: (132-181)/(89-104) 161/102 (08/11 1121) SpO2:  [96 %-100 %] 100 % (08/11 1121)  CBC:  Recent Labs  Lab 02/13/18 0417  WBC 6.0  NEUTROABS 4.0  HGB 14.2  HCT 43.2  MCV 88.3  PLT 147*    Basic Metabolic Panel:  Recent Labs  Lab 02/13/18 0417  NA 140  K 3.5  CL 107  CO2 24  GLUCOSE 145*  BUN 20  CREATININE 1.18  CALCIUM 8.5*    Lipid Panel:     Component Value Date/Time   CHOL 186 02/14/2018 0400   TRIG 106 02/14/2018 0400   HDL 49 02/14/2018 0400   CHOLHDL 3.8 02/14/2018 0400   VLDL 21 02/14/2018 0400   LDLCALC 116 (H) 02/14/2018 0400   HgbA1c:  Lab Results  Component Value Date   HGBA1C 6.7 (H) 02/13/2018   Urine Drug Screen:     Component Value Date/Time   LABOPIA NONE DETECTED 02/13/2018 0355   COCAINSCRNUR NONE DETECTED 02/13/2018 0355   LABBENZ NONE DETECTED 02/13/2018 0355   AMPHETMU NONE DETECTED 02/13/2018 0355   THCU NONE DETECTED 02/13/2018 0355   LABBARB NONE DETECTED 02/13/2018 0355    Alcohol Level     Component Value Date/Time   ETH <10 02/13/2018 0417    IMAGING I have personally reviewed the radiological images below and agree with the radiology interpretations.  Ct Cervical Spine Wo Contrast Ct Head Wo Contrast 02/13/2018 IMPRESSION:  1. No evidence of traumatic intracranial injury or fracture.  2. No evidence of fracture or subluxation along the cervical spine.  3. Mild cortical volume loss noted.  4. Mild mucosal thickening at  the right maxillary sinus and sphenoid sinus.  5. Mild calcification at the carotid bifurcations bilaterally.   Mr [facility] Contrast Mr Maxine Glenn Neck W Wo Contrast 02/13/2018 IMPRESSION:  1. Normal MRI of the neck without and with contrast. No focal stenosis or vascular lesion.  2. Normal variant MRA circle-of-Willis without significant proximal stenosis, aneurysm, or branch vessel occlusion.   Mr Brain Wo Contrast 02/13/2018 IMPRESSION:  Punctate focus of acute ischemia at the inferior aspect of the right caudate head. No hemorrhage or mass effect.   Transthoracic Echocardiogram - pending   PHYSICAL EXAM  Temp:  [97.4 F (36.3 C)-98.7 F (37.1 C)] 97.5 F (36.4 C) (08/11 1121) Pulse Rate:  [64-77] 64 (08/11 1121) Resp:  [16-22] 20 (08/11 1121) BP: (132-181)/(89-104) 161/102 (08/11 1121) SpO2:  [96 %-100 %] 100 % (08/11 1121)  General - Well nourished, well developed, in no apparent distress.  Ophthalmologic - fundi not visualized due to noncooperation.  Cardiovascular - Regular rate and rhythm with no murmur.  Mental Status -  Level of arousal and orientation to time, place, and person were intact. Language including expression, naming, repetition, comprehension was assessed and found intact. Fund of Knowledge was assessed and was intact.  Cranial Nerves II - XII - II - Visual field intact OU. III, IV, VI - Extraocular movements intact. V - Facial sensation intact  bilaterally. VII - Facial movement intact bilaterally. VIII - Hearing & vestibular intact bilaterally. X - Palate elevates symmetrically. XI - Chin turning & shoulder shrug intact bilaterally. XII - Tongue protrusion intact.  Motor Strength - The patient's strength was normal in all extremities and pronator drift was absent.  Bulk was normal and fasciculations were absent.   Motor Tone - Muscle tone was assessed at the neck and appendages and was normal.  Reflexes - The patient's reflexes were symmetrical  in all extremities and he had no pathological reflexes.  Sensory - Light touch, temperature/pinprick were assessed and were symmetrical.    Coordination - The patient had normal movements in the hands and feet with no ataxia or dysmetria.  Tremor was absent.  Gait and Station - normal gait.   ASSESSMENT/PLAN [name] is a 62 y.o. male with history of HTN, CAD, DM type 2(on oral hypoglycemic agents), HLD, baseline decreased hearing, remote tobacco use(1990),glomerulonephritisand graves' disease  presenting with neck pain, HA, and slurred speech. He did not receive IV t-PA due to late presentation.  Stroke: Punctate focus of acute ischemia at the inferior aspect of the right caudate head.  Resultant slight right neck pain  CT head - Mild cortical volume loss noted.   MRI head - Punctate focus of acute ischemia at the inferior aspect of the right caudate head.  MRA head and neck- normal  2D Echo - pending  LDL - 116  HgbA1c - 6.7   UDS negative  VTE prophylaxis - Lovenox  No antithrombotic prior to admission, now on aspirin 81 mg daily and clopidogrel 75 mg daily.  Continue DAPT for 3 weeks and then aspirin alone  Patient counseled to be compliant with his antithrombotic medications Diet Carb Modified Fluid consistency: Thin; Room service appropriate? Yes  Ongoing aggressive stroke risk factor management  Therapy recommendations:  none  Disposition: Home  Cervical radiculopathy  Neck pain, right more than left  Occipital neuralgia  MRA head and neck negative  Conservative management  Hypertension  Stable . Long-term BP goal normotensive  Hyperlipidemia  Lipid lowering medication PTA: Lipitor 80 mg daily  LDL 116, goal < 70  Continue Lipitor 80  Continue statin at discharge  Diabetes  HgbA1c 6.7, goal < 7.0  Controlled  SSI  CBG monitoring  Other Stroke Risk Factors  Advanced age  Former cigarette smoker - quit  Obesity,  Body mass index is 30.61 kg/m., recommend weight loss, diet and exercise as appropriate    Other Active Problems  UTI - IV Rocephin  Hospital day # 0  Neurology will sign off. Please call with questions. [facility] in about 4 weeks. Thanks for the consult.  Marvel PlanJindong Shacoya Burkhammer, MD PhD Stroke Neurology 02/14/2018 2:20 PM   To contact Stroke Continuity provider, please refer to [email or web address]. After hours, contact General Neurology

## 2018-02-14 NOTE — Progress Notes (Signed)
  Echocardiogram 2D Echocardiogram has been performed.  Calvin ChimesWendy  Calhoun Williams 02/14/2018, 2:14 PM

## 2018-02-14 NOTE — Progress Notes (Signed)
Pt discharged with family at bedside. No distress noted, iv was removed. Discharge instructions given and explained, pt verbalized understanding no questions at time of discharge.

## 2018-02-14 NOTE — Care Management Note (Signed)
Case Management Note  Patient Details  Name: Calvin LimboJorge Silas Sr. MRN: [id number] Date of Birth: [birth year]  Subjective/Objective:                    Action/Plan: Pt discharging home with self care. No f/u per PT/OT and no DME needs. Pt has PCP, insurance and transportation home.   Expected Discharge Date:  02/14/18               Expected Discharge Plan:  Home/Self Care  In-House Referral:     Discharge planning Services     Post Acute Care Choice:    Choice offered to:     DME Arranged:    DME Agency:     HH Arranged:    HH Agency:     Status of Service:  Completed, signed off  If discussed at [employer] Length of Stay Meetings, dates discussed:    Additional Comments:  Calvin BaloKelli F Tupac Jeffus, RN 02/14/2018, 1:09 PM

## 2018-02-14 NOTE — Progress Notes (Signed)
Occupational Therapy Evaluation Patient Details Name: Calvin LimboJorge Estrin Sr. MRN: [id number] DOB: 10-01-1955 Today's Date: 02/14/2018    History of Present Illness 62 yo male [facility] with musculoskeletal neck pain, slurred speech and headache = 36 hours.  PMH + for Grave's disease, prior smoker - 1990 quit.  Imaging study showed 3 mm focus of acute ischemia at the inferior aspect of right caudate cva   Clinical Impression   Pt independent with ADL and mobility and is functioning at his baseline. Reports significant improvement with his speech. Wife/brother-in-law present. Educated pt/family on signs/symptoms of CVA using Be[employer]. Pt verbalized understanding. OT signing off.     Follow Up Recommendations  No OT follow up    Equipment Recommendations  None recommended by OT    Recommendations for Other Services       Precautions / Restrictions        Mobility Bed Mobility Overal bed mobility: Independent                Transfers Overall transfer level: Independent                    Balance Overall balance assessment: Independent                               Standardized Balance Assessment Standardized Balance Assessment : Dynamic Gait Index   Dynamic Gait Index Level Surface: Normal Change in Gait Speed: Normal Gait with Horizontal Head Turns: Normal Gait with Vertical Head Turns: Normal Gait and Pivot Turn: Normal Step Over Obstacle: Normal Step Around Obstacles: Normal Steps: Normal Total Score: 24     ADL either performed or assessed with clinical judgement   ADL Overall ADL's : At baseline                                             Vision Baseline Vision/History: No visual deficits Vision Assessment?: No apparent visual deficits Additional Comments: Pt states vision is baseline     Perception     Praxis Praxis Praxis tested?: Within functional limits    Pertinent Vitals/Pain Pain Assessment:  0-10 Pain Score: 3  Pain Location: neck Pain Descriptors / Indicators: Discomfort Pain Intervention(s): Limited activity within patient's tolerance     Hand Dominance Right   Extremity/Trunk Assessment Upper Extremity Assessment Upper Extremity Assessment: Overall WFL for tasks assessed   Lower Extremity Assessment Lower Extremity Assessment: Overall WFL for tasks assessed   Cervical / Trunk Assessment Cervical / Trunk Assessment: Normal   Communication Communication Communication: Expressive difficulties("little slower than normal")   Cognition Arousal/Alertness: Awake/alert Behavior During Therapy: WFL for tasks assessed/performed Overall Cognitive Status: Within Functional Limits for tasks assessed                                     General Comments       Exercises     Shoulder Instructions      Home Living Family/patient expects to be discharged to:: Private residence Living Arrangements: Spouse/significant other Available Help at Discharge: Family;Available 24 hours/day Type of Home: House Home Access: Stairs to enter Enter[employer] CorporationEntrance Stairs-Number of Steps: 1 Entrance Stairs-Rails: None Home Layout: One level     Bathroom Shower/Tub: Tub/shower unit  Bathroom Toilet: Pharmacist, community: Yes How Accessible: Accessible via walker Home Equipment: None          Prior Functioning/Environment Level of Independence: Independent                 OT Problem List: Pain      OT Treatment/Interventions:      OT Goals(Current goals can be found in the care plan section) Acute Rehab OT Goals Patient Stated Goal: to go home OT Goal Formulation: All assessment and education complete, DC therapy  OT Frequency:     Barriers to D/C:            Co-evaluation              AM-PAC PT "6 Clicks" Daily Activity     Outcome Measure Help from another person eating meals?: None Help from another person taking care of personal  grooming?: None Help from another person toileting, which includes using toliet, bedpan, or urinal?: None Help from another person bathing (including washing, rinsing, drying)?: None Help from another person to put on and taking off regular upper body clothing?: None Help from another person to put on and taking off regular lower body clothing?: None 6 Click Score: 24   End of Session Nurse Communication: Mobility status  Activity Tolerance: Patient tolerated treatment well Patient left: in bed;with call bell/phone within reach;with family/visitor present  OT Visit Diagnosis: Pain Pain - part of body: (neck)                Time: 1610-9604 OT Time Calculation (min): 17 min Charges:  OT General Charges $OT Visit: 1 Visit OT Evaluation $OT Eval Low Complexity: 1 Low  Luisa Dago, OT/L  OT Clinical Specialist [phone]   [facility] 02/14/2018, 9:24 AM

## 2018-02-15 LAB — URINE CULTURE: Culture: >=100000 — AB

## 2018-02-15 LAB — HEMOGLOBIN A1C
Hgb A1c MFr Bld: 6.8 % — ABNORMAL HIGH (ref 4.8–5.6)
Mean Plasma Glucose: 148 mg/dL

## 2018-02-16 ENCOUNTER — Telehealth: Payer: Self-pay

## 2018-02-16 NOTE — Telephone Encounter (Signed)
TCM follow up call made to patient unable to leave message for return call.

## 2018-02-16 NOTE — Telephone Encounter (Signed)
Transition Care Management Follow-up Telephone Call  ADMISSION DATE: 02/12/2018  DISCHARGE DATE: 02/14/2018  Patient needs BMP/CBC drawn during visit per hospital discharge orders.   How have you been since you were released from the hospital? Feeling ok per patient.   Do you understand why you were in the hospital? Yes per patient.    Do you understand the discharge instrcutions? Yes per patient    Items Reviewed:  Medications reviewed: Asked patient to bring medications in with him.  Allergies reviewed: PCN   Dietary changes reviewed: Heart healthy Carbohydrate modified.  Referrals reviewed: Appointment scheduled with E. Saguier, PA-C  Functional Questionnaire:   Activities of Daily Living (ADLs): Patient can perform all independently.  Any patient concerns? Would like to find out if he is still allergic to PCN   Confirmed importance and date/time of follow-up visits scheduled:Yes   Confirmed with patient if condition begins to worsen call PCP or go to the ER. Yes    Patient was given the office number and encouragred to call back with questions or concerns.Yes

## 2018-02-19 ENCOUNTER — Ambulatory Visit (INDEPENDENT_AMBULATORY_CARE_PROVIDER_SITE_OTHER): Payer: 59 | Admitting: Medical

## 2018-02-19 ENCOUNTER — Encounter: Payer: Self-pay | Admitting: Medical

## 2018-02-19 VITALS — BP 130/86 | HR 70 | Temp 98.3°F | Resp 16 | Ht 73.0 in | Wt 216.4 lb

## 2018-02-19 DIAGNOSIS — L97509 Non-pressure chronic ulcer of other part of unspecified foot with unspecified severity: Secondary | ICD-10-CM

## 2018-02-19 DIAGNOSIS — Z8673 Personal history of transient ischemic attack (TIA), and cerebral infarction without residual deficits: Secondary | ICD-10-CM

## 2018-02-19 DIAGNOSIS — I10 Essential (primary) hypertension: Secondary | ICD-10-CM | POA: Diagnosis not present

## 2018-02-19 DIAGNOSIS — E11621 Type 2 diabetes mellitus with foot ulcer: Secondary | ICD-10-CM | POA: Diagnosis not present

## 2018-02-19 LAB — CBC WITH DIFFERENTIAL/PLATELET
Basophils Absolute: 0 10*3/uL (ref 0.0–0.1)
Basophils Relative: 0.7 % (ref 0.0–3.0)
Eosinophils Absolute: 0.1 10*3/uL (ref 0.0–0.7)
Eosinophils Relative: 1.7 % (ref 0.0–5.0)
HCT: 42.8 % (ref 39.0–52.0)
Hemoglobin: 14.7 g/dL (ref 13.0–17.0)
Lymphocytes Relative: 24.6 % (ref 12.0–46.0)
Lymphs Abs: 1 10*3/uL (ref 0.7–4.0)
MCHC: 34.3 g/dL (ref 30.0–36.0)
MCV: 85 fl (ref 78.0–100.0)
Monocytes Absolute: 0.4 10*3/uL (ref 0.1–1.0)
Monocytes Relative: 9.2 % (ref 3.0–12.0)
Neutro Abs: 2.6 10*3/uL (ref 1.4–7.7)
Neutrophils Relative %: 63.8 % (ref 43.0–77.0)
Platelets: 190 10*3/uL (ref 150.0–400.0)
RBC: 5.03 Mil/uL (ref 4.22–5.81)
RDW: 14.1 % (ref 11.5–15.5)
WBC: 4.1 10*3/uL (ref 4.0–10.5)

## 2018-02-19 LAB — COMPREHENSIVE METABOLIC PANEL
ALT: 29 U/L (ref 0–53)
AST: 31 U/L (ref 0–37)
Albumin: 4 g/dL (ref 3.5–5.2)
Alkaline Phosphatase: 90 U/L (ref 39–117)
BUN: 28 mg/dL — ABNORMAL HIGH (ref 6–23)
CO2: 31 mEq/L (ref 19–32)
Calcium: 9.6 mg/dL (ref 8.4–10.5)
Chloride: 102 mEq/L (ref 96–112)
Creatinine, Ser: 1.37 mg/dL (ref 0.40–1.50)
GFR: 55.69 mL/min — ABNORMAL LOW (ref >60.00)
Glucose, Bld: 110 mg/dL — ABNORMAL HIGH (ref 70–99)
Potassium: 4.1 mEq/L (ref 3.5–5.1)
Sodium: 139 mEq/L (ref 135–145)
Total Bilirubin: 1.2 mg/dL (ref 0.2–1.2)
Total Protein: 7 g/dL (ref 6.0–8.3)

## 2018-02-19 NOTE — Patient Instructions (Signed)
You do appear to have done very well post cva. Your neurologic exam is normal. No deficits found today.  Your bp has been well controlled. Continue current medications as you pulse and bp well controlled. But let me know if you are on metoprolol as you are not sure if you are on.  Please get cbc and cmp today.  Continue plavix and aspirin. Follow up with neurologist  If any neurologic or cardiac symptoms please be seen in ED   Follow up in 3-4 weeks with pcp or as needed

## 2018-02-19 NOTE — Progress Notes (Signed)
Subjective:    Patient ID: Calvin LimboJorge Filyaw Sr., male    DOB: [birth year], 62 y.o.   MRN: [id number]  HPI  Pt in for recent hospitalization. Pt had some neck pain and pain in his head left side before hospitalization. Pt went to ED and it was noted at he had slurred speech. He was worked up at determined to have. Acute cerebrovascular accident with ischemia in the inferior right caudate head. He was admitted on 02/12/2018 and discharged on 02/14/2018.   Wife of patient thinks he is now speaking normal per pt. Pt also notes no gross motor or sensory deficits.  Pt bp is 130/80 today and has been controlled since hospital dc. Pt discharged on plavix and aspirin. Pt has appointment for follow up with neurologist in abut 3 weeks.  Pt sugars have been in low 100's since discharge discharged.  He was advised to repeat cmp and cbc on follow up.   Reviewed med list today. He is not sure if he is taking the metoprolol. He bp and pulse is well controlled.     Review of Systems  Constitutional: Negative for chills, fatigue and fever.  HENT: Negative for congestion, ear pain, facial swelling, sinus pressure and sinus pain.   Respiratory: Negative for cough, chest tightness, shortness of breath and wheezing.   Cardiovascular: Negative for chest pain and palpitations.  Gastrointestinal: Negative for abdominal pain.  Genitourinary: Negative for dysuria.  Musculoskeletal: Negative for back pain.  Neurological: Negative for dizziness, syncope, speech difficulty, weakness, light-headedness, numbness and headaches.  Hematological: Negative for adenopathy. Does not bruise/bleed easily.  Psychiatric/Behavioral: Negative for agitation, behavioral problems, confusion and sleep disturbance. The patient is not nervous/anxious.    Past Medical History:  Diagnosis Date  . Decreased hearing    Left  . Diabetes mellitus 2009   adult onset  . Glomerulonephritis 12/2008   bx provem membranous GN, sees renal q year   . Grave's disease    reportedly, history of i nthe past u/s 6/11 slightly enlarged gland w/o nodules  . H/O: hematuria    saw urology 3/09, neg w/u  . Hyperlipemia   . Hypertension   . Secondary hyperparathyroidism, renal (HCC)    Dr. Hyman HopesWebb  . Urinary retention 01/2009   was seen at the ER and followup by urology     Social History   Socioeconomic History  . Marital status: Married    Spouse name: Not on file  . Number of children: 3  . Years of education: Not on file  . Highest education level: Not on file  Occupational History  . Occupation: welder  Social Needs  . Financial resource strain: Not very hard  . Food insecurity:    Worry: Patient refused    Inability: Patient refused  . Transportation needs:    Medical: Patient refused    Non-medical: Patient refused  Tobacco Use  . Smoking status: Former Smoker    Packs/day: 1.00    Last attempt to quit: 04/17/1989    Years since quitting: 28.8  . Smokeless tobacco: Never Used  Substance and Sexual Activity  . Alcohol use: No  . Drug use: No  . Sexual activity: Not on file  Lifestyle  . Physical activity:    Days per week: Patient refused    Minutes per session: Patient refused  . Stress: Only a little  Relationships  . Social connections:    Talks on phone: Patient refused    Gets together: Patient refused  Attends religious service: Patient refused    Active member of club or organization: Patient refused    Attends meetings of clubs or organizations: Patient refused    Relationship status: Patient refused  . Intimate partner violence:    Fear of current or ex partner: Patient refused    Emotionally abused: Patient refused    Physically abused: Patient refused    Forced sexual activity: Patient refused  Other Topics Concern  . Not on file  Social History Narrative   Original from [place], moved to [place] in the 80s , lives w/ wife    Past Surgical History:  Procedure Laterality Date  . right knee  surgery     around 2015    Family History  Problem Relation Age of Onset  . Diabetes Other        GM  . Diabetes Maternal Grandmother   . Hypertension Maternal Grandmother   . Healthy Mother   . Heart attack Neg Hx   . Colon cancer Neg Hx   . Prostate cancer Neg Hx     Allergies  Allergen Reactions  . Penicillins     Unsure if this drug was the cause of rash    Current Outpatient Medications on File Prior to Visit  Medication Sig Dispense Refill  . aspirin EC 81 MG EC tablet Take 1 tablet (81 mg total) by mouth daily. 30 tablet 0  . atorvastatin (LIPITOR) 80 MG tablet Take 1 tablet (80 mg total) by mouth daily. 30 tablet 6  . clopidogrel (PLAVIX) 75 MG tablet Take 1 tablet (75 mg total) by mouth daily for 21 days. 21 tablet 0  . cyclobenzaprine (FLEXERIL) 5 MG tablet Take 1 tablet (5 mg total) by mouth 2 (two) times daily as needed for muscle spasms. 60 tablet 0  . diclofenac sodium (VOLTAREN) 1 % GEL Apply 2 g topically 3 (three) times daily as needed (pain). 100 g 0  . glucose blood (FREESTYLE INSULINX TEST) test strip Test blood sugar once daily. Dx code: E11.9 100 each 12  . Lancets (FREESTYLE) lancets Check blood sugar once daily 100 each 12  . losartan (COZAAR) 100 MG tablet Take 1 tablet (100 mg total) by mouth daily. 30 tablet 6  . metFORMIN (GLUCOPHAGE) 850 MG tablet Take 1 tablet (850 mg total) by mouth 2 (two) times daily with a meal. 60 tablet 6  . metoprolol tartrate (LOPRESSOR) 25 MG tablet Take 0.5 tablets (12.5 mg total) by mouth 2 (two) times daily. 30 tablet 5  . nitroGLYCERIN (NITROSTAT) 0.4 MG SL tablet Place 1 tablet (0.4 mg total) under the tongue every 5 (five) minutes as needed for chest pain. 25 tablet 3  . pioglitazone (ACTOS) 30 MG tablet Take 1 tablet (30 mg total) by mouth daily. 30 tablet 5   No current facility-administered medications on file prior to visit.     BP 130/86   Pulse 70   Temp 98.3 F (36.8 C) (Oral)   Resp 16   Ht 6\' 1"  (1.854  m)   Wt 216 lb 6.4 oz (98.2 kg)   SpO2 98%   BMI 28.55 kg/m       Objective:   Physical Exam  General Mental Status- Alert. General Appearance- Not in acute distress. No slurred speech.  Skin General: Color- Normal Color. Moisture- Normal Moisture.  Neck Carotid Arteries- Normal color. Moisture- Normal Moisture. No carotid bruits. No JVD.  Chest and Lung Exam Auscultation: Breath Sounds:-Normal.  Cardiovascular Auscultation:Rythm- Regular. Murmurs &  Other Heart Sounds:Auscultation of the heart reveals- No Murmurs.  Abdomen Inspection:-Inspeection Normal. Palpation/Percussion:Note:No mass. Palpation and Percussion of the abdomen reveal- Non Tender, Non Distended + BS, no rebound or guarding.    Neurologic Cranial Nerve exam:- CN III-XII intact(No nystagmus), symmetric smile. Drift Test:- No drift. Romberg Exam:- Negative.  Heal to Toe Gait exam:-Normal. Finger to Nose:- Normal/Intact Strength:- 5/5 equal and symmetric strength both upper and lower extremities.     Assessment & Plan:  You do appear to have done very well post cva. Your neurologic exam is normal. No deficits found today.  Your bp has been well controlled. Continue current medications as you pulse and bp well controlled. But let me know if you are on metoprolol as you are not sure if you are on.  Please get cbc and cmp today.  Continue plavix and aspirin. Follow up with neurologist  If any neurologic or cardiac symptoms please be seen in ED   Follow up in 3-4 weeks with pcp or as needed  Esperanza RichtersEdward Mirielle Byrum, PA-C

## 2018-02-26 DIAGNOSIS — D649 Anemia, unspecified: Secondary | ICD-10-CM | POA: Diagnosis not present

## 2018-02-26 DIAGNOSIS — I129 Hypertensive chronic kidney disease with stage 1 through stage 4 chronic kidney disease, or unspecified chronic kidney disease: Secondary | ICD-10-CM | POA: Diagnosis not present

## 2018-02-26 DIAGNOSIS — N042 Nephrotic syndrome with diffuse membranous glomerulonephritis: Secondary | ICD-10-CM | POA: Diagnosis not present

## 2018-03-12 ENCOUNTER — Encounter: Payer: Self-pay | Admitting: Internal Medicine

## 2018-03-12 ENCOUNTER — Ambulatory Visit (INDEPENDENT_AMBULATORY_CARE_PROVIDER_SITE_OTHER): Payer: 59 | Admitting: Internal Medicine

## 2018-03-12 VITALS — BP 142/72 | HR 58 | Temp 97.8°F | Resp 14 | Ht 73.0 in | Wt 215.4 lb

## 2018-03-12 DIAGNOSIS — I639 Cerebral infarction, unspecified: Secondary | ICD-10-CM

## 2018-03-12 DIAGNOSIS — E118 Type 2 diabetes mellitus with unspecified complications: Secondary | ICD-10-CM

## 2018-03-12 DIAGNOSIS — I10 Essential (primary) hypertension: Secondary | ICD-10-CM

## 2018-03-12 DIAGNOSIS — I2510 Atherosclerotic heart disease of native coronary artery without angina pectoris: Secondary | ICD-10-CM

## 2018-03-12 MED ORDER — ATORVASTATIN CALCIUM 80 MG PO TABS: 80.0000 mg | ORAL_TABLET | Freq: Every day | ORAL | 6 refills | Status: DC

## 2018-03-12 MED ORDER — LOSARTAN POTASSIUM 100 MG PO TABS: 100.0000 mg | ORAL_TABLET | Freq: Every day | ORAL | 6 refills | Status: DC

## 2018-03-12 MED ORDER — METOPROLOL TARTRATE 25 MG PO TABS: 12.5000 mg | ORAL_TABLET | Freq: Two times a day (BID) | ORAL | 6 refills | Status: DC

## 2018-03-12 MED ORDER — PIOGLITAZONE HCL 30 MG PO TABS: 30.0000 mg | ORAL_TABLET | Freq: Every day | ORAL | 6 refills | Status: DC

## 2018-03-12 MED ORDER — METFORMIN HCL 850 MG PO TABS: 850.0000 mg | ORAL_TABLET | Freq: Two times a day (BID) | ORAL | 6 refills | Status: DC

## 2018-03-12 NOTE — Progress Notes (Signed)
Subjective:    Patient ID: Calvin Friede Sr., male    DOB: 07-29-1955, 62 y.o.   MRN: [id number]  DOS:  03/12/2018 Type of visit - description : rov Interval history: Since the last OV had a stroke, feels fully recovered . We went over his blood work and records.   Review of Systems Denies chest pain or difficulty breathing. No slurred speech, difficulty swallowing.   Past Medical History:  Diagnosis Date  . Decreased hearing    Left  . Diabetes mellitus 2009   adult onset  . Glomerulonephritis 12/2008   bx provem membranous GN, sees renal q year  . Grave's disease    reportedly, history of i nthe past u/s 6/11 slightly enlarged gland w/o nodules  . H/O: hematuria    saw urology 3/09, neg w/u  . Hyperlipemia   . Hypertension   . Secondary hyperparathyroidism, renal (HCC)    Dr. [name]  . Urinary retention 01/2009   was seen at the ER and followup by urology    Past Surgical History:  Procedure Laterality Date  . right knee surgery     around 2015    Social History   Socioeconomic History  . Marital status: Married    Spouse name: Not on file  . Number of children: 3  . Years of education: Not on file  . Highest education level: Not on file  Occupational History  . Occupation: welder  Social Needs  . Financial resource strain: Not very hard  . Food insecurity:    Worry: Patient refused    Inability: Patient refused  . Transportation needs:    Medical: Patient refused    Non-medical: Patient refused  Tobacco Use  . Smoking status: Former Smoker    Packs/day: 1.00    Last attempt to quit: 04/17/1989    Years since quitting: 28.9  . Smokeless tobacco: Never Used  Substance and Sexual Activity  . Alcohol use: No  . Drug use: No  . Sexual activity: Not on file  Lifestyle  . Physical activity:    Days per week: Patient refused    Minutes per session: Patient refused  . Stress: Only a little  Relationships  . Social connections:    Talks on phone:  Patient refused    Gets together: Patient refused    Attends religious service: Patient refused    Active member of club or organization: Patient refused    Attends meetings of clubs or organizations: Patient refused    Relationship status: Patient refused  . Intimate partner violence:    Fear of current or ex partner: Patient refused    Emotionally abused: Patient refused    Physically abused: Patient refused    Forced sexual activity: Patient refused  Other Topics Concern  . Not on file  Social History Narrative   Original from [place], moved to [place] in the 80s , lives w/ wife      Allergies as of 03/12/2018      Reactions   Penicillins    Unsure if this drug was the cause of rash      Medication List        Accurate as of 03/12/18 11:59 PM. Always use your most recent med list.          aspirin 81 MG EC tablet Take 1 tablet (81 mg total) by mouth daily.   atorvastatin 80 MG tablet Commonly known as:  LIPITOR Take 1 tablet (80 mg total) by mouth  daily.   cyclobenzaprine 5 MG tablet Commonly known as:  FLEXERIL Take 1 tablet (5 mg total) by mouth 2 (two) times daily as needed for muscle spasms.   diclofenac sodium 1 % Gel Commonly known as:  VOLTAREN Apply 2 g topically 3 (three) times daily as needed (pain).   freestyle lancets Check blood sugar once daily   glucose blood test strip Test blood sugar once daily. Dx code: E11.9   losartan 100 MG tablet Commonly known as:  COZAAR Take 1 tablet (100 mg total) by mouth daily.   metFORMIN 850 MG tablet Commonly known as:  GLUCOPHAGE Take 1 tablet (850 mg total) by mouth 2 (two) times daily with a meal.   metoprolol tartrate 25 MG tablet Commonly known as:  LOPRESSOR Take 0.5 tablets (12.5 mg total) by mouth 2 (two) times daily.   nitroGLYCERIN 0.4 MG SL tablet Commonly known as:  NITROSTAT Place 1 tablet (0.4 mg total) under the tongue every 5 (five) minutes as needed for chest pain.   pioglitazone 30 MG  tablet Commonly known as:  ACTOS Take 1 tablet (30 mg total) by mouth daily.          Objective:   Physical Exam BP (!) 142/72 (BP Location: Left Arm, Patient Position: Sitting, Cuff Size: Small)   Pulse (!) 58   Temp 97.8 F (36.6 C) (Oral)   Resp 14   Ht 6\' 1"  (1.854 m)   Wt 215 lb 6 oz (97.7 kg)   SpO2 98%   BMI 28.42 kg/m  General:   Well developed, NAD, see BMI.  HEENT:  Normocephalic . Face symmetric, atraumatic Lungs:  CTA B Normal respiratory effort, no intercostal retractions, no accessory muscle use. Heart: RRR,  no murmur.  No pretibial edema bilaterally  Skin: Not pale. Not jaundice Neurologic:  alert & oriented X3.  Speech normal, gait appropriate for age and unassisted Psych--  Cognition and judgment appear intact.  Cooperative with normal attention span and concentration.  Behavior appropriate. No anxious or depressed appearing.      Assessment & Plan:  Assessment DM 2009 HTN Hyperlipidemia CV: CAD 01/2017: had CP,  stress test show an area of fixed defect, saw cardiology, likely s/p a out-of-hospital MI at the time of CP. Rx Plavix /metoprolol  echo 02/2017 nl EF CVA 02/2018 per MRI, MRA neck head (-), echo ok; was on ASA, was rx plavix x 3 weeks  Renal: Dr [name] ---Glomerulonephritis 2010, BX proven ---Secondary hyperparathyroidism HOH L GU H/o Hematuria, urology w/u 09/2007 H/o Urinary retention 2010   PLAN: DM: Few months ago, A1c was 7.8, Actos was added to metformin, most recent A1c is 6.8.  He is working on diet and exercise.  No change. HTN: Currently on losartan.  Run out of Lopressor.  Ambulatory BP 130, 140.  Will restart low-dose of metoprolol, BP goal 120, watch for bradycardia.  See instructions. CAD: Asymptomatic. CVA 02/2018 per MRI, MRA neck head (-), echo ok; was on ASA, was rx plavix x 3 weeks.  Plan: Control CV RF, continue aspirin.  Will see neurology for outpatient follow-up soon. Onychomycosis: Documented by nail culture,  patient afraid of taking Lamisil b/c shortly after he started the medication he had a stroke.  I understand his concern but doubt there is a relationship though. Multiple RFs RTC 3 months CPX   Today, I spent more than  25  min with the patient: >50% of the time counseling regards new issue(stroke), need to control  CVRF Also: -reviewing the chart and labs ordered by other providers

## 2018-03-12 NOTE — Patient Instructions (Addendum)
  GO TO THE FRONT DESK Schedule your next appointment for a  Physical exam in 3 months   Check the  blood pressure 2 or 3 times a   week Also check your pulse Be sure your blood pressure is between 110/65 and  135/85. If it is consistently higher or lower, let me know  If the pulse is less than 50 let me know

## 2018-03-12 NOTE — Progress Notes (Signed)
Pre visit review using our clinic review tool, if applicable. No additional management support is needed unless otherwise documented below in the visit note. 

## 2018-03-13 NOTE — Assessment & Plan Note (Signed)
DM: Few months ago, A1c was 7.8, Actos was added to metformin, most recent A1c is 6.8.  He is working on diet and exercise.  No change. HTN: Currently on losartan.  Run out of Lopressor.  Ambulatory BP 130, 140.  Will restart low-dose of metoprolol, BP goal 120, watch for bradycardia.  See instructions. CAD: Asymptomatic. CVA 02/2018 per MRI, MRA neck head (-), echo ok; was on ASA, was rx plavix x 3 weeks.  Plan: Control CV RF, continue aspirin.  Will see neurology for outpatient follow-up soon. Onychomycosis: Documented by nail culture, patient afraid of taking Lamisil b/c shortly after he started the medication he had a stroke.  I understand his concern but doubt there is a relationship though. Multiple RFs RTC 3 months CPX

## 2018-03-18 NOTE — Progress Notes (Signed)
[facility] [address] [place]. [place] [address] [phone]       OFFICE FOLLOW UP NOTE  Mr. Calvin Taillon Sr. Date of Birth:  1955-07-23 Medical Record Number:  [id number]   Reason for Referral:  hospital stroke follow up  CHIEF COMPLAINT:  Chief Complaint  Patient presents with  . Follow-up    Hospital stroke follow up room 9 pt alone    HPI: Calvin Kemppainen Sr. is being seen today for initial visit in the office for right caudate head infarct secondary to small vessel disease on 02/13/2018. History obtained from patient and chart review. Reviewed all radiology images and labs personally.  Mr. Calvin Defina Sr. is a 62 y.o. male with history of HTN, CAD, DM type 2(on oral hypoglycemic agents), HLD, baseline decreased hearing, remote tobacco use(1990),glomerulonephritisand graves' disease who presented with neck pain, HA, and slurred speech. He did not receive IV t-PA due to late presentation.  CT had reviewed and was negative for acute infarct.  MRI head reviewed and show punctate focus of acute ischemia at the inferior aspect of the right caudate head.  MRA head and neck were normal.  2D echo showed an EF of 55 to 60%.  LDL 116 and recommended continuation of Lipitor 80 mg.  HTN stable and recommended long-term BP goal normotensive range.  A1c 6.7 and recommended continued follow-up with PCP for management.  Patient is not on antithrombotic PTA and recommended DAPT for 3 weeks and aspirin alone.  Patient was discharged home in stable condition without therapy needs.  Patient is being seen today for hospital follow-up.  He has been doing well without residual deficits or recurrence of symptoms.  Denies recurrence of neck pain or headache.  He has completed 3 weeks of DAPT and currently on aspirin alone without side effects of bleeding or bruising.  Continues to take Lipitor without side effects of myalgias.  Blood pressure today 147/69.  Patient does monitor this at  home with recordings and typical SBP 1 20-1 40.  He does monitor glucose levels at home and typically range from 100-1 30.  HTN, HLD and DM are all managed by his primary care provider.  He has returned back to work full-time without complication.  He has returned back to all prior activities.  Denies new or worsening stroke/TIA symptoms.   ROS:   14 system review of systems performed and negative with exception of weight loss and ringing in ears  PMH:  Past Medical History:  Diagnosis Date  . Decreased hearing    Left  . Diabetes mellitus 2009   adult onset  . Glomerulonephritis 12/2008   bx provem membranous GN, sees renal q year  . Grave's disease    reportedly, history of i nthe past u/s 6/11 slightly enlarged gland w/o nodules  . H/O: hematuria    saw urology 3/09, neg w/u  . Hyperlipemia   . Hypertension   . Secondary hyperparathyroidism, renal (HCC)    Dr. [name]  . Stroke (HCC)   . Urinary retention 01/2009   was seen at the ER and followup by urology    PSH:  Past Surgical History:  Procedure Laterality Date  . right knee surgery     around 2015    Social History:  Social History   Socioeconomic History  . Marital status: Married    Spouse name: Not on file  . Number of children: 3  . Years of education: Not on file  . Highest  education level: Not on file  Occupational History  . Occupation: welder  Social Needs  . Financial resource strain: Not very hard  . Food insecurity:    Worry: Patient refused    Inability: Patient refused  . Transportation needs:    Medical: Patient refused    Non-medical: Patient refused  Tobacco Use  . Smoking status: Former Smoker    Packs/day: 1.00    Last attempt to quit: 04/17/1989    Years since quitting: 28.9  . Smokeless tobacco: Never Used  Substance and Sexual Activity  . Alcohol use: No  . Drug use: No  . Sexual activity: Not on file  Lifestyle  . Physical activity:    Days per week: Patient refused     Minutes per session: Patient refused  . Stress: Only a little  Relationships  . Social connections:    Talks on phone: Patient refused    Gets together: Patient refused    Attends religious service: Patient refused    Active member of club or organization: Patient refused    Attends meetings of clubs or organizations: Patient refused    Relationship status: Patient refused  . Intimate partner violence:    Fear of current or ex partner: Patient refused    Emotionally abused: Patient refused    Physically abused: Patient refused    Forced sexual activity: Patient refused  Other Topics Concern  . Not on file  Social History Narrative   Original from [place], moved to [place] in the 80s , lives w/ wife    Family History:  Family History  Problem Relation Age of Onset  . Diabetes Other        GM  . Diabetes Maternal Grandmother   . Hypertension Maternal Grandmother   . Healthy Mother   . Heart attack Neg Hx   . Colon cancer Neg Hx   . Prostate cancer Neg Hx     Medications:   Current Outpatient Medications on File Prior to Visit  Medication Sig Dispense Refill  . aspirin EC 81 MG EC tablet Take 1 tablet (81 mg total) by mouth daily. 30 tablet 0  . atorvastatin (LIPITOR) 80 MG tablet Take 1 tablet (80 mg total) by mouth daily. 30 tablet 6  . glucose blood (FREESTYLE INSULINX TEST) test strip Test blood sugar once daily. Dx code: E11.9 100 each 12  . Lancets (FREESTYLE) lancets Check blood sugar once daily 100 each 12  . losartan (COZAAR) 100 MG tablet Take 1 tablet (100 mg total) by mouth daily. 30 tablet 6  . metFORMIN (GLUCOPHAGE) 850 MG tablet Take 1 tablet (850 mg total) by mouth 2 (two) times daily with a meal. 60 tablet 6  . metoprolol tartrate (LOPRESSOR) 25 MG tablet Take 0.5 tablets (12.5 mg total) by mouth 2 (two) times daily. 30 tablet 6  . nitroGLYCERIN (NITROSTAT) 0.4 MG SL tablet Place 1 tablet (0.4 mg total) under the tongue every 5 (five) minutes as needed for chest  pain. 25 tablet 3  . pioglitazone (ACTOS) 30 MG tablet Take 1 tablet (30 mg total) by mouth daily. 30 tablet 6   No current facility-administered medications on file prior to visit.     Allergies:   Allergies  Allergen Reactions  . Penicillins     Unsure if this drug was the cause of rash     Physical Exam  Vitals:   03/19/18 0809  BP: (!) 147/69  Pulse: 90  Weight: 215 lb 6.4 oz (97.7  kg)  Height: 6\' 1"  (1.854 m)   Body mass index is 28.42 kg/m. No exam data present  General: well developed, well nourished, pleasant middle-aged Hispanic male, seated, in no evident distress Head: head normocephalic and atraumatic.   Neck: supple with no carotid or supraclavicular bruits Cardiovascular: regular rate and rhythm, no murmurs Musculoskeletal: no deformity Skin:  no rash/petichiae Vascular:  Normal pulses all extremities  Neurologic Exam Mental Status: Awake and fully alert. Oriented to place and time. Recent and remote memory intact. Attention span, concentration and fund of knowledge appropriate. Mood and affect appropriate.  Cranial Nerves: Fundoscopic exam reveals sharp disc margins. Pupils equal, briskly reactive to light. Extraocular movements full without nystagmus. Visual fields full to confrontation. Hearing intact. Facial sensation intact. Face, tongue, palate moves normally and symmetrically.  Motor: Normal bulk and tone. Normal strength in all tested extremity muscles. Sensory.: intact to touch , pinprick , position and vibratory sensation.  Coordination: Rapid alternating movements normal in all extremities. Finger-to-nose and heel-to-shin performed accurately bilaterally. Gait and Station: Arises from chair without difficulty. Stance is normal. Gait demonstrates normal stride length and balance . Able to heel, toe and tandem walk without difficulty.  Reflexes: 1+ and symmetric. Toes downgoing.    NIHSS  0 Modified Rankin  0    Diagnostic Data (Labs, Imaging,  Testing) I have personally reviewed the radiological images below and agree with the radiology interpretations.  Ct Cervical Spine Wo Contrast Ct Head Wo Contrast 02/13/2018 IMPRESSION:  1. No evidence of traumatic intracranial injury or fracture.  2. No evidence of fracture or subluxation along the cervical spine.  3. Mild cortical volume loss noted.  4. Mild mucosal thickening at the right maxillary sinus and sphenoid sinus.  5. Mild calcification at the carotid bifurcations bilaterally.   [facility] Mr Maxine GlennMra Neck W Wo Contrast 02/13/2018 IMPRESSION:  1. Normal MRI of the neck without and with contrast. No focal stenosis or vascular lesion.  2. Normal variant MRA circle-of-Willis without significant proximal stenosis, aneurysm, or branch vessel occlusion.   Mr Brain Wo Contrast 02/13/2018 IMPRESSION:  Punctate focus of acute ischemia at the inferior aspect of the right caudate head. No hemorrhage or mass effect.   Transthoracic Echocardiogram 02/14/2018 Study Conclusions - Left ventricle: The cavity size was normal. Wall thickness was   increased in a pattern of mild LVH. Systolic function was normal.   The estimated ejection fraction was in the range of 55% to 60%.   Wall motion was normal; there were no regional wall motion   abnormalities. Doppler parameters are consistent with abnormal   left ventricular relaxation (grade 1 diastolic dysfunction). - Aortic valve: There was no stenosis. - Aorta: Mildly dilated aortic root. Aortic root dimension: 38 mm   (ED). - Mitral valve: There was no significant regurgitation. - Right ventricle: The cavity size was normal. Systolic function   was normal. - Pulmonary arteries: No complete TR doppler jet so unable to   estimate PA systolic pressure. - Inferior vena cava: The vessel was normal in size. The   respirophasic diameter changes were in the normal range (>= 50%),   consistent with normal central venous  pressure.   ASSESSMENT: Janine LimboJorge Veltri Sr. is a 62 y.o. year old male here with right caudate head infarct on 02/13/2018 secondary to small vessel disease. Vascular risk factors include HTN, HLD, CAD and DM.  Patient is being seen today for hospital follow-up and overall doing well without residual deficits  or recurring symptoms.    PLAN: -Continue aspirin 81 mg daily  and Lipitor 80 mg for secondary stroke prevention -F/u with PCP regarding your HLD, HTN and DM management -continue to monitor BP at home -advised to continue to stay active and maintain a healthy diet -Maintain strict control of hypertension with blood pressure goal below 130/90, diabetes with hemoglobin A1c goal below 6.5% and cholesterol with LDL cholesterol (bad cholesterol) goal below 70 mg/dL. I also advised the patient to eat a healthy diet with plenty of whole grains, cereals, fruits and vegetables, exercise regularly and maintain ideal body weight.  Follow up in 6 months as patient stable from stroke standpoint and is followed closely by PCP for risk factor management.  Advised patient to call earlier if needed.   Greater than 50% of time during this 25 minute visit was spent on counseling,explanation of diagnosis of right caudate head infarct, reviewing risk factor management of HLD, HTN, CAD and DM, planning of further management, discussion with patient and family and coordination of care    George Hugh, AGNP-BC  [facility] [address] [place], [place] [address]  Phone [phone] Fax [phone] Note: This document was prepared with digital dictation and possible smart phrase technology. Any transcriptional errors that result from this process are unintentional.

## 2018-03-19 ENCOUNTER — Encounter: Payer: Self-pay | Admitting: Adult Health

## 2018-03-19 ENCOUNTER — Ambulatory Visit (INDEPENDENT_AMBULATORY_CARE_PROVIDER_SITE_OTHER): Payer: 59 | Admitting: Adult Health

## 2018-03-19 VITALS — BP 147/69 | HR 90 | Ht 73.0 in | Wt 215.4 lb

## 2018-03-19 DIAGNOSIS — E785 Hyperlipidemia, unspecified: Secondary | ICD-10-CM | POA: Diagnosis not present

## 2018-03-19 DIAGNOSIS — E118 Type 2 diabetes mellitus with unspecified complications: Secondary | ICD-10-CM | POA: Diagnosis not present

## 2018-03-19 DIAGNOSIS — I63511 Cerebral infarction due to unspecified occlusion or stenosis of right middle cerebral artery: Secondary | ICD-10-CM

## 2018-03-19 DIAGNOSIS — I10 Essential (primary) hypertension: Secondary | ICD-10-CM | POA: Diagnosis not present

## 2018-03-19 NOTE — Patient Instructions (Addendum)
Continue aspirin 81 mg daily  and lipitor 80mg   for secondary stroke prevention  Continue to follow up with PCP regarding cholesterol and blood pressure management   Continue to stay active and maintain a healthy diet  Continue to monitor blood pressure at home  Maintain strict control of hypertension with blood pressure goal below 130/90, diabetes with hemoglobin A1c goal below 6.5% and cholesterol with LDL cholesterol (bad cholesterol) goal below 70 mg/dL. I also advised the patient to eat a healthy diet with plenty of whole grains, cereals, fruits and vegetables, exercise regularly and maintain ideal body weight.  Followup in the future with me in 6 months or call earlier if needed        [facility]. I hope we have been able to provide you high quality care today.  You may receive a patient satisfaction survey over the next few weeks. We would appreciate your feedback and comments so that we may continue to improve ourselves and the health of our patients.

## 2018-03-21 NOTE — Progress Notes (Signed)
I agree with the above plan 

## 2018-04-23 DIAGNOSIS — Z23 Encounter for immunization: Secondary | ICD-10-CM | POA: Diagnosis not present

## 2018-05-26 IMAGING — NM NM MISC PROCEDURE
6 series · 36 of 36 positions shown · non-contrast
Comparison: none

[Series 1: wbr_r-proj_st rest · 6.40mm/px · 6 of 64 frames shown]
[frame 6/64]
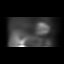
[frame 16/64]
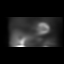
[frame 27/64]
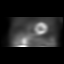
[frame 38/64]
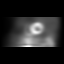
[frame 48/64]
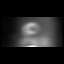
[frame 59/64]
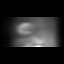

[Series 1: stress-sum-em · 6.40mm/px · 6 of 64 frames shown]
[frame 6/64]
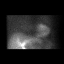
[frame 16/64]
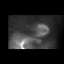
[frame 27/64]
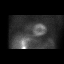
[frame 38/64]
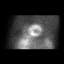
[frame 48/64]
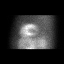
[frame 59/64]
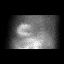

[Series 1: stress-gsp · 6.40mm/px · 6 of 485 frames shown]
[frame 41/485  full-range]
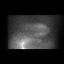
[frame 121/485  full-range]
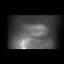
[frame 202/485  full-range]
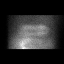
[frame 283/485  full-range]
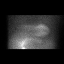
[frame 364/485  full-range]
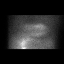
[frame 445/485  full-range]
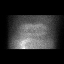

[Series 1: wbr_s-proj_st stress-gsp · 6.40mm/px · 6 of 512 frames shown]
[frame 43/512]
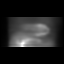
[frame 128/512]
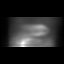
[frame 214/512]
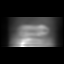
[frame 299/512]
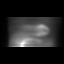
[frame 384/512]
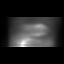
[frame 470/512]
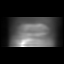

[Series 1: rest · 6.40mm/px · 6 of 63 frames shown]
[frame 6/63]
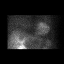
[frame 16/63]
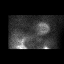
[frame 27/63]
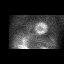
[frame 37/63]
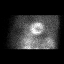
[frame 48/63]
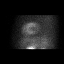
[frame 58/63]
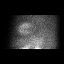

[Series 1: wbr_s-proj_st stress-sum-em · 6.40mm/px · 6 of 64 frames shown]
[frame 6/64]
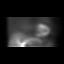
[frame 16/64]
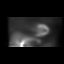
[frame 27/64]
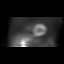
[frame 38/64]
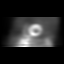
[frame 48/64]
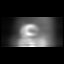
[frame 59/64]
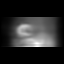

[36 of 36 positions shown; findings below may reference images not displayed]

Canned report from images found in remote index.

Refer to host system for actual result text.

## 2018-07-02 ENCOUNTER — Encounter: Payer: 59 | Admitting: Internal Medicine

## 2018-07-13 ENCOUNTER — Other Ambulatory Visit: Payer: Self-pay

## 2018-07-13 ENCOUNTER — Encounter (HOSPITAL_COMMUNITY): Payer: Self-pay | Admitting: Emergency Medicine

## 2018-07-13 ENCOUNTER — Ambulatory Visit (HOSPITAL_COMMUNITY)
Admission: EM | Admit: 2018-07-13 | Discharge: 2018-07-13 | Disposition: A | Discharge status: Home / Self Care | Payer: 59 | Attending: Emergency Medicine | Admitting: Emergency Medicine

## 2018-07-13 DIAGNOSIS — R55 Syncope and collapse: Secondary | ICD-10-CM | POA: Diagnosis not present

## 2018-07-13 DIAGNOSIS — R509 Fever, unspecified: Secondary | ICD-10-CM | POA: Insufficient documentation

## 2018-07-13 DIAGNOSIS — R404 Transient alteration of awareness: Secondary | ICD-10-CM | POA: Diagnosis not present

## 2018-07-13 DIAGNOSIS — R110 Nausea: Secondary | ICD-10-CM | POA: Diagnosis not present

## 2018-07-13 DIAGNOSIS — R1084 Generalized abdominal pain: Secondary | ICD-10-CM | POA: Diagnosis not present

## 2018-07-13 LAB — GLUCOSE, CAPILLARY: Glucose-Capillary: 154 mg/dL — ABNORMAL HIGH (ref 70–99)

## 2018-07-13 MED ORDER — ACETAMINOPHEN 325 MG PO TABS
ORAL_TABLET | ORAL | Status: AC
  Filled 2018-07-13: qty 2

## 2018-07-13 MED ORDER — ACETAMINOPHEN 325 MG PO TABS
650.0000 mg | ORAL_TABLET | Freq: Once | ORAL | Status: AC
  Administered 2018-07-13: 650 mg via ORAL

## 2018-07-13 NOTE — ED Provider Notes (Addendum)
HPI  SUBJECTIVE:  Calvin Filkins Sr. is a 63 y.o. male who presents with feeling feverish for the past 5 days.  He has not taken his temperature at home.  He reports sore throat, nasal congestion, rhinorrhea, postnasal drip, frontal headache.  He reports a nonproductive cough and upper abdominal soreness secondary to the cough.  He denies any other abdominal pain.  He states that he has been around contacts with similar symptoms, thinks that they may have flu.  He did get a flu shot this year.  He has been taking Zyrtec without improvement in symptoms.  No aggravating or alleviating factors.  He denies any nausea, vomiting, sensation of throat swelling shut, drooling, trismus, difficulty breathing.  No upper dental pain, body aches.  No wheezing, chest pain, shortness of breath.  No new back pain, urinary complaints.  No neck stiffness, rash.  No antibiotics in the past month.  No antipyretic in the past 4 to 6 hours.  Patient has a past medical history of CVA x2, diabetes, states that his glucose has been running within normal limits for him.  Also hypertension, coronary artery disease, UTI, NSTEMI, Graves' disease.  No history of presyncope, syncope, arrhythmia, atrial fibrillation.  PMD: [name], MD   Past Medical History:  Diagnosis Date  . Decreased hearing    Left  . Diabetes mellitus 2009   adult onset  . Glomerulonephritis 12/2008   bx provem membranous GN, sees renal q year  . Grave's disease    reportedly, history of i nthe past u/s 6/11 slightly enlarged gland w/o nodules  . H/O: hematuria    saw urology 3/09, neg w/u  . Hyperlipemia   . Hypertension   . Secondary hyperparathyroidism, renal (HCC)    Dr. [name]  . Stroke (HCC)   . Urinary retention 01/2009   was seen at the ER and followup by urology    Past Surgical History:  Procedure Laterality Date  . right knee surgery     around 2015    Family History  Problem Relation Age of Onset  . Diabetes Other        GM  .  Diabetes Maternal Grandmother   . Hypertension Maternal Grandmother   . Healthy Mother   . Heart attack Neg Hx   . Colon cancer Neg Hx   . Prostate cancer Neg Hx     Social History   Tobacco Use  . Smoking status: Former Smoker    Packs/day: 1.00    Last attempt to quit: 04/17/1989    Years since quitting: 29.2  . Smokeless tobacco: Never Used  Substance Use Topics  . Alcohol use: No  . Drug use: No    No current facility-administered medications for this encounter.   Current Outpatient Medications:  .  aspirin EC 81 MG EC tablet, Take 1 tablet (81 mg total) by mouth daily., Disp: 30 tablet, Rfl: 0 .  atorvastatin (LIPITOR) 80 MG tablet, Take 1 tablet (80 mg total) by mouth daily., Disp: 30 tablet, Rfl: 6 .  glucose blood (FREESTYLE INSULINX TEST) test strip, Test blood sugar once daily. Dx code: E11.9, Disp: 100 each, Rfl: 12 .  Lancets (FREESTYLE) lancets, Check blood sugar once daily, Disp: 100 each, Rfl: 12 .  losartan (COZAAR) 100 MG tablet, Take 1 tablet (100 mg total) by mouth daily., Disp: 30 tablet, Rfl: 6 .  metFORMIN (GLUCOPHAGE) 850 MG tablet, Take 1 tablet (850 mg total) by mouth 2 (two) times daily with  a meal., Disp: 60 tablet, Rfl: 6 .  metoprolol tartrate (LOPRESSOR) 25 MG tablet, Take 0.5 tablets (12.5 mg total) by mouth 2 (two) times daily., Disp: 30 tablet, Rfl: 6 .  nitroGLYCERIN (NITROSTAT) 0.4 MG SL tablet, Place 1 tablet (0.4 mg total) under the tongue every 5 (five) minutes as needed for chest pain., Disp: 25 tablet, Rfl: 3 .  pioglitazone (ACTOS) 30 MG tablet, Take 1 tablet (30 mg total) by mouth daily., Disp: 30 tablet, Rfl: 6  Allergies  Allergen Reactions  . Penicillins     Unsure if this drug was the cause of rash     ROS  As noted in HPI.   Physical Exam  BP (!) 125/93 (BP Location: Right Arm)   Pulse 66   Temp 99.3 F (37.4 C) (Oral)   Resp 16   SpO2 97%   Constitutional: Well developed, well nourished, no acute distress Eyes:  PERRL, EOMI, conjunctiva normal bilaterally HENT: Normocephalic, atraumatic,mucus membranes moist Respiratory: Clear to auscultation bilaterally, no rales, no wheezing, no rhonchi Cardiovascular: Normal rate and rhythm, no murmurs, no gallops, no rubs GI: Normal appearance, soft, nondistended, normal bowel sounds, nontender, no rebound, no guarding Back: no CVAT skin: No rash, skin intact Musculoskeletal: No edema, no tenderness, no deformities Neurologic: Alert & oriented x 3, CN II-XII grossly intact, no motor deficits, sensation grossly intact Psychiatric: Speech and behavior appropriate   ED Course   Medications  acetaminophen (TYLENOL) tablet 650 mg (650 mg Oral Given 07/13/18 1050)    Orders Placed This Encounter  Procedures  . Glucose, capillary    Standing Status:   Standing    Number of Occurrences:   1  . EKG 12-Lead    Standing Status:   Standing    Number of Occurrences:   1  . EKG 12-Lead    Standing Status:   Standing    Number of Occurrences:   1   Results for orders placed or performed during the hospital encounter of 07/13/18 (from the past 24 hour(s))  Glucose, capillary     Status: Abnormal   Collection Time: 07/13/18 11:38 AM  Result Value Ref Range   Glucose-Capillary 154 (H) 70 - 99 mg/dL   No results found.  ED Clinical Impression  Transient alteration of awareness  Fever, unspecified   ED Assessment/Plan  As I was talking to the patient, he became unresponsive where he lost tone, and nearly slid off of the table.  He was unresponsive for approximately 15 to 30 seconds.  Eyes were open the entire time.  There were no tonic-clonic movements.  He returned rapidly to normal consciousness.  Patient states that he was feeling extremely nauseous at the time.  He denies chest pain, shortness of breath, palpitations, tunnel vision.  He states that he does not think that completely lost consciousness.  However he became pale, diaphoretic, nauseous and  started vomiting afterwards.  Fingerstick was 154.  Repeat vital signs, was afebrile at 99.3, heart rate 68, 125/93, O2 sat 97 on repeat vital signs, but was given Tylenol in triage.  EKG: Normal sinus rhythm, rate 76.  Left axis deviation.  No hypertrophy.  No ST-T wave elevation.  No changes compared to previous EKG from 02/2018.  Patient has multiple medical comorbidities.  He was febrile initially, concern for influenza, however with this alteration awareness/presyncopal event, concern for possible sinus pauses or other emergent cause of his symptoms.  Transferring to the [facility] ED via EMS.  Meds ordered this encounter  Medications  . acetaminophen (TYLENOL) tablet 650 mg    *This clinic note was created using Scientist, clinical (histocompatibility and immunogenetics)Dragon dictation software. Therefore, there may be occasional mistakes despite careful proofreading.  ?   Domenick GongMortenson, Jhonatan Lomeli, MD 07/13/18 1154    Domenick GongMortenson, Anne Boltz, MD 07/13/18 1240

## 2018-07-13 NOTE — ED Notes (Signed)
EMS dispatched for pt transport to [facility] ED per provider dr Terrilee Croak

## 2018-07-15 ENCOUNTER — Ambulatory Visit (INDEPENDENT_AMBULATORY_CARE_PROVIDER_SITE_OTHER): Payer: 59 | Admitting: Internal Medicine

## 2018-07-15 ENCOUNTER — Encounter: Payer: Self-pay | Admitting: Internal Medicine

## 2018-07-15 VITALS — BP 142/76 | HR 74 | Temp 98.4°F | Resp 16 | Ht 73.0 in | Wt 216.0 lb

## 2018-07-15 DIAGNOSIS — R55 Syncope and collapse: Secondary | ICD-10-CM

## 2018-07-15 DIAGNOSIS — B349 Viral infection, unspecified: Secondary | ICD-10-CM

## 2018-07-15 MED ORDER — HYDROCODONE-HOMATROPINE 5-1.5 MG/5ML PO SYRP: 5.0000 mL | ORAL_SOLUTION | Freq: Every evening | ORAL | 0 refills | Status: DC | PRN

## 2018-07-15 MED ORDER — AZITHROMYCIN 250 MG PO TABS: ORAL_TABLET | ORAL | 0 refills | Status: DC

## 2018-07-15 NOTE — Progress Notes (Signed)
Subjective:    Patient ID: Calvin Williams., male    DOB: 04/18/1956, 63 y.o.   MRN: [id number]  DOS:  07/15/2018 Type of visit - description:   Micah FlesherWent to the urgent care 07/13/2018 with history of feeling feverish for 5 days , BP was 135/93, O2 sat 97%, temperature 99.3 degrees. I review the  UC note and the pt side of the story; When the  physician enter the room to examine the patient, he c/o severe nausea, MD reports pt become unresponsive for 15 to 30 seconds, eyes were open the entire time.  Pt states no LOC, "I was  listening to the doctor all the time", he was simply too nauseous to respond.  No tonic-clonic movements.  After he woke up, no postictal state is described in the note. No CP, SOB, palpitations.  CBG 154.  BP repeated: 125/93 with a heart rate of 68.  EKG no acute changes, was recommended to ED referred to the ED via EMS but pt declined due to cost.  Review of Systems  After he left the urgent care 2 days ago, went to work yesterday and performed well. He continues with cough with occasional dark sputum. Denies chest pain other than some soreness at the anterior chest wall and lateral sides with cough. Still feeling feverish last night. Mild runny nose, sore throat eyes are slightly watery. No shortness of breath Admits to myalgias since the onset of symptoms about a week ago, overall myalgias are better. No vomiting or diarrhea.  Past Medical History:  Diagnosis Date  . Decreased hearing    Left  . Diabetes mellitus 2009   adult onset  . Glomerulonephritis 12/2008   bx provem membranous GN, sees renal q year  . Grave's disease    reportedly, history of i nthe past u/s 6/11 slightly enlarged gland w/o nodules  . H/O: hematuria    saw urology 3/09, neg w/u  . Hyperlipemia   . Hypertension   . Secondary hyperparathyroidism, renal (HCC)    Dr. Hyman HopesWebb  . Stroke (HCC)   . Urinary retention 01/2009   was seen at the ER and followup by urology    Past Surgical  History:  Procedure Laterality Date  . right knee surgery     around 2015    Social History   Socioeconomic History  . Marital status: Married    Spouse name: Not on file  . Number of children: 3  . Years of education: Not on file  . Highest education level: Not on file  Occupational History  . Occupation: welder  Social Needs  . Financial resource strain: Not very hard  . Food insecurity:    Worry: Patient refused    Inability: Patient refused  . Transportation needs:    Medical: Patient refused    Non-medical: Patient refused  Tobacco Use  . Smoking status: Former Smoker    Packs/day: 1.00    Last attempt to quit: 04/17/1989    Years since quitting: 29.2  . Smokeless tobacco: Never Used  Substance and Sexual Activity  . Alcohol use: No  . Drug use: No  . Sexual activity: Not on file  Lifestyle  . Physical activity:    Days per week: Patient refused    Minutes per session: Patient refused  . Stress: Only a little  Relationships  . Social connections:    Talks on phone: Patient refused    Gets together: Patient refused    Attends religious service:  Patient refused    Active member of club or organization: Patient refused    Attends meetings of clubs or organizations: Patient refused    Relationship status: Patient refused  . Intimate partner violence:    Fear of current or ex partner: Patient refused    Emotionally abused: Patient refused    Physically abused: Patient refused    Forced sexual activity: Patient refused  Other Topics Concern  . Not on file  Social History Narrative   Original from [place], moved to [place] in the 80s , lives w/ wife      Allergies as of 07/15/2018      Reactions   Penicillins    Unsure if this drug was the cause of rash      Medication List       Accurate as of July 15, 2018 10:13 PM. Always use your most recent med list.        aspirin 81 MG EC tablet Take 1 tablet (81 mg total) by mouth daily.   atorvastatin 80 MG  tablet Commonly known as:  LIPITOR Take 1 tablet (80 mg total) by mouth daily.   azithromycin 250 MG tablet Commonly known as:  ZITHROMAX Z-PAK 2 tabs a day the first day, then 1 tab a day x 4 days   freestyle lancets Check blood sugar once daily   glucose blood test strip Commonly known as:  FREESTYLE INSULINX TEST Test blood sugar once daily. Dx code: E11.9   HYDROcodone-homatropine 5-1.5 MG/5ML syrup Commonly known as:  HYCODAN Take 5 mLs by mouth at bedtime as needed for cough.   losartan 100 MG tablet Commonly known as:  COZAAR Take 1 tablet (100 mg total) by mouth daily.   metFORMIN 850 MG tablet Commonly known as:  GLUCOPHAGE Take 1 tablet (850 mg total) by mouth 2 (two) times daily with a meal.   metoprolol tartrate 25 MG tablet Commonly known as:  LOPRESSOR Take 0.5 tablets (12.5 mg total) by mouth 2 (two) times daily.   nitroGLYCERIN 0.4 MG SL tablet Commonly known as:  NITROSTAT Place 1 tablet (0.4 mg total) under the tongue every 5 (five) minutes as needed for chest pain.   pioglitazone 30 MG tablet Commonly known as:  ACTOS Take 1 tablet (30 mg total) by mouth daily.           Objective:   Physical Exam BP (!) 142/76 (BP Location: Left Arm, Patient Position: Sitting, Cuff Size: Normal)   Pulse 74   Temp 98.4 F (36.9 C) (Oral)   Resp 16   Ht 6\' 1"  (1.854 m)   Wt 216 lb (98 kg)   SpO2 96%   BMI 28.50 kg/m  General:   Well developed, NAD, nontoxic appearing, BMI noted. HEENT:  Normocephalic . Face symmetric, atraumatic. TMs, minimal redness, no bulging. Lungs:  CTA B, does have few rhonchi with cough Normal respiratory effort, no intercostal retractions, no accessory muscle use. Heart: RRR,  no murmur.  No pretibial edema bilaterally  Skin: Not pale. Not jaundice Neurologic:  alert & oriented X3.  Speech normal, gait appropriate for age and unassisted Psych--  Cognition and judgment appear intact.  Cooperative with normal attention  span and concentration.  Behavior appropriate. No anxious or depressed appearing.      Assessment     Assessment DM 2009 HTN Hyperlipidemia CV: CAD 01/2017: had CP,  stress test show an area of fixed defect, saw cardiology, likely s/p a out-of-hospital MI at the time  of CP. Rx Plavix /metoprolol  echo 02/2017 nl EF CVA 02/2018 per MRI, MRA neck head (-), echo ok; was on ASA, was rx plavix x 3 weeks  Renal: Dr Hyman HopesWebb ---Glomerulonephritis 2010, BX proven ---Secondary hyperparathyroidism [facility] L GU H/o Hematuria, urology w/u 09/2007 H/o Urinary retention 2010   PLAN: Viral syndrome: History consistent with viral syndromes, now with persistent cough and low-grade fever, no pneumonia on physical exam. Cough disturbing his sleep. Plan: Mucinex DM, Flonase, Zithromax, cough control with hydrocodone.  See instructions Syncope?  Information I got is somewhat contradictory, in the last 48 hours there has been no concerning chest pain, syncope, only respiratory symptoms.  Recommend observation. F/u schedule for next months  > 25 min

## 2018-07-15 NOTE — Patient Instructions (Signed)
Rest, fluids , tylenol  For cough:  Take Mucinex DM twice a day as needed until better Take hydrocodone at bedtime only, will cause drowsiness.  For nasal congestion: Use OTC Flonase : 2 nasal sprays on each side of the nose in the morning until you feel better  Avoid decongestants such as  Pseudoephedrine or phenylephrine   Take the antibiotic as prescribed: Zithromax   call if not gradually better over the next  10 days  Call anytime if the symptoms are severe

## 2018-07-15 NOTE — Progress Notes (Signed)
Pre visit review using our clinic review tool, if applicable. No additional management support is needed unless otherwise documented below in the visit note. 

## 2018-07-15 NOTE — Assessment & Plan Note (Signed)
Viral syndrome: History consistent with viral syndromes, now with persistent cough and low-grade fever, no pneumonia on physical exam. Cough disturbing his sleep. Plan: Mucinex DM, Flonase, Zithromax, cough control with hydrocodone.  See instructions Syncope?  Information I got is somewhat contradictory, in the last 48 hours there has been no concerning chest pain, syncope, only respiratory symptoms.  Recommend observation. F/u schedule for next months

## 2018-07-23 ENCOUNTER — Encounter: Payer: Self-pay | Admitting: Internal Medicine

## 2018-07-23 ENCOUNTER — Ambulatory Visit (INDEPENDENT_AMBULATORY_CARE_PROVIDER_SITE_OTHER): Payer: 59 | Admitting: Internal Medicine

## 2018-07-23 ENCOUNTER — Other Ambulatory Visit: Payer: Self-pay

## 2018-07-23 ENCOUNTER — Ambulatory Visit (HOSPITAL_BASED_OUTPATIENT_CLINIC_OR_DEPARTMENT_OTHER)
Admission: RE | Admit: 2018-07-23 | Discharge: 2018-07-23 | Disposition: A | Discharge status: Home / Self Care | Payer: 59 | Source: Ambulatory Visit | Attending: Internal Medicine | Admitting: Internal Medicine

## 2018-07-23 VITALS — BP 132/90 | HR 72 | Temp 97.9°F | Resp 16 | Ht 73.0 in | Wt 206.1 lb

## 2018-07-23 DIAGNOSIS — M545 Low back pain, unspecified: Secondary | ICD-10-CM

## 2018-07-23 DIAGNOSIS — R634 Abnormal weight loss: Secondary | ICD-10-CM | POA: Diagnosis not present

## 2018-07-23 LAB — CBC WITH DIFFERENTIAL/PLATELET
Basophils Absolute: 0 10*3/uL (ref 0.0–0.1)
Basophils Relative: 0.3 % (ref 0.0–3.0)
Eosinophils Absolute: 0 10*3/uL (ref 0.0–0.7)
Eosinophils Relative: 0.3 % (ref 0.0–5.0)
HCT: 41.4 % (ref 39.0–52.0)
Hemoglobin: 14.3 g/dL (ref 13.0–17.0)
Lymphocytes Relative: 14.1 % (ref 12.0–46.0)
Lymphs Abs: 1 10*3/uL (ref 0.7–4.0)
MCHC: 34.6 g/dL (ref 30.0–36.0)
MCV: 85.8 fl (ref 78.0–100.0)
Monocytes Absolute: 0.8 10*3/uL (ref 0.1–1.0)
Monocytes Relative: 10.3 % (ref 3.0–12.0)
Neutro Abs: 5.6 10*3/uL (ref 1.4–7.7)
Neutrophils Relative %: 75 % (ref 43.0–77.0)
Platelets: 290 10*3/uL (ref 150.0–400.0)
RBC: 4.82 Mil/uL (ref 4.22–5.81)
RDW: 13.4 % (ref 11.5–15.5)
WBC: 7.4 10*3/uL (ref 4.0–10.5)

## 2018-07-23 LAB — BASIC METABOLIC PANEL
BUN: 21 mg/dL (ref 6–23)
CO2: 27 mEq/L (ref 19–32)
Calcium: 9.1 mg/dL (ref 8.4–10.5)
Chloride: 104 mEq/L (ref 96–112)
Creatinine, Ser: 1.29 mg/dL (ref 0.40–1.50)
GFR: 56.09 mL/min — ABNORMAL LOW (ref >60.00)
Glucose, Bld: 220 mg/dL — ABNORMAL HIGH (ref 70–99)
Potassium: 4 mEq/L (ref 3.5–5.1)
Sodium: 139 mEq/L (ref 135–145)

## 2018-07-23 LAB — SEDIMENTATION RATE: Sed Rate: 3 mm/hr (ref 0–20)

## 2018-07-23 MED ORDER — DICLOFENAC SODIUM 50 MG PO TBEC: 50.0000 mg | DELAYED_RELEASE_TABLET | Freq: Two times a day (BID) | ORAL | 0 refills | Status: DC

## 2018-07-23 MED ORDER — HYDROCODONE-ACETAMINOPHEN 5-325 MG PO TABS: 1.0000 | ORAL_TABLET | Freq: Every evening | ORAL | 0 refills | Status: DC | PRN

## 2018-07-23 NOTE — Patient Instructions (Addendum)
GO TO THE LAB : Get the blood work     STOP BY THE FIRST FLOOR:  get the XR     compresas frias en la espalda  Diclofenac, anti-inflamatorio dos veces al dia con las comidas, solo si hay dolor, puede irritar el estomago  Hydrocodone: solo de noche si tiene dolor severo, le dara suen~o  llame en unos dias si nomejora o si tiene fiebre, rash, dolor intenso

## 2018-07-23 NOTE — Progress Notes (Signed)
Subjective:    Patient ID: Calvin Edsell Sr., male    DOB: 20-Jun-1956, 63 y.o.   MRN: [id number]  DOS:  07/23/2018 Type of visit - description: acute Was seen 07/15/2018 with viral illness, respiratory symptoms. He took a Z-Pak and symptoms resolved within 2 to 3 days.  Here because he developed back pain 07/20/2018, the next day the pain got slightly worse, was mostly located at the right low back, that day his right leg felt "funny" (paresthesias). Today the pain has moved more to the left side of the lower back and somewhat radiate to the left buttock. No lower extremity paresthesias today. Pain is definitely worse when he bends over or twists his torso. He has been able to work all along.  I noticed significant weight loss, he is actually feeling well and is trying to do bette, improved portion control.  Wt Readings from Last 3 Encounters:  07/23/18 206 lb 2 oz (93.5 kg)  07/15/18 216 lb (98 kg)  03/19/18 215 lb 6.4 oz (97.7 kg)   Review of Systems No fever chills Had some sore throat before but that is gone No dental pain but he did have a dental procedure 06/17/2018. No neck pain No rash Past Medical History:  Diagnosis Date  . Decreased hearing    Left  . Diabetes mellitus 2009   adult onset  . Glomerulonephritis 12/2008   bx provem membranous GN, sees renal q year  . Grave's disease    reportedly, history of i nthe past u/s 6/11 slightly enlarged gland w/o nodules  . H/O: hematuria    saw urology 3/09, neg w/u  . Hyperlipemia   . Hypertension   . Secondary hyperparathyroidism, renal (HCC)    Dr. [name]  . Stroke (HCC)   . Urinary retention 01/2009   was seen at the ER and followup by urology    Past Surgical History:  Procedure Laterality Date  . right knee surgery     around 2015    Social History   Socioeconomic History  . Marital status: Married    Spouse name: Not on file  . Number of children: 3  . Years of education: Not on file  . Highest  education level: Not on file  Occupational History  . Occupation: welder  Social Needs  . Financial resource strain: Not very hard  . Food insecurity:    Worry: Patient refused    Inability: Patient refused  . Transportation needs:    Medical: Patient refused    Non-medical: Patient refused  Tobacco Use  . Smoking status: Former Smoker    Packs/day: 1.00    Last attempt to quit: 04/17/1989    Years since quitting: 29.2  . Smokeless tobacco: Never Used  Substance and Sexual Activity  . Alcohol use: No  . Drug use: No  . Sexual activity: Not on file  Lifestyle  . Physical activity:    Days per week: Patient refused    Minutes per session: Patient refused  . Stress: Only a little  Relationships  . Social connections:    Talks on phone: Patient refused    Gets together: Patient refused    Attends religious service: Patient refused    Active member of club or organization: Patient refused    Attends meetings of clubs or organizations: Patient refused    Relationship status: Patient refused  . Intimate partner violence:    Fear of current or ex partner: Patient refused    Emotionally  abused: Patient refused    Physically abused: Patient refused    Forced sexual activity: Patient refused  Other Topics Concern  . Not on file  Social History Narrative   Original from [place], moved to [place] in the 80s , lives w/ wife      Allergies as of 07/23/2018      Reactions   Penicillins    Unsure if this drug was the cause of rash      Medication List       Accurate as of July 23, 2018 11:59 PM. Always use your most recent med list.        aspirin 81 MG EC tablet Take 1 tablet (81 mg total) by mouth daily.   atorvastatin 80 MG tablet Commonly known as:  LIPITOR Take 1 tablet (80 mg total) by mouth daily.   diclofenac 50 MG EC tablet Commonly known as:  VOLTAREN Take 1 tablet (50 mg total) by mouth 2 (two) times daily.   freestyle lancets Check blood sugar once  daily   glucose blood test strip Commonly known as:  FREESTYLE INSULINX TEST Test blood sugar once daily. Dx code: E11.9   HYDROcodone-acetaminophen 5-325 MG tablet Commonly known as:  NORCO/VICODIN Take 1-2 tablets by mouth at bedtime as needed.   losartan 100 MG tablet Commonly known as:  COZAAR Take 1 tablet (100 mg total) by mouth daily.   metFORMIN 850 MG tablet Commonly known as:  GLUCOPHAGE Take 1 tablet (850 mg total) by mouth 2 (two) times daily with a meal.   metoprolol tartrate 25 MG tablet Commonly known as:  LOPRESSOR Take 0.5 tablets (12.5 mg total) by mouth 2 (two) times daily.   nitroGLYCERIN 0.4 MG SL tablet Commonly known as:  NITROSTAT Place 1 tablet (0.4 mg total) under the tongue every 5 (five) minutes as needed for chest pain.   pioglitazone 30 MG tablet Commonly known as:  ACTOS Take 1 tablet (30 mg total) by mouth daily.           Objective:   Physical Exam BP 132/90   Pulse 72   Temp 97.9 F (36.6 C) (Oral)   Resp 16   Ht 6\' 1"  (1.854 m)   Wt 206 lb 2 oz (93.5 kg)   SpO2 97%   BMI 27.19 kg/m  General:   Well developed, NAD, nontoxic, BMI noted. HEENT:  Normocephalic . Face symmetric, atraumatic Lungs:  CTA B Normal respiratory effort, no intercostal retractions, no accessory muscle use. Heart: RRR,  no murmur.  No pretibial edema bilaterally  Skin: Not pale. Not jaundice.  No rash MSK: No TTP at the spine.  No TTP at the sacroiliac joints. Hip rotation: Normal Mild tenderness at the left trochanteric bursa? Neurologic:  alert & oriented X3.  Speech normal, gait appropriate for age and unassisted.  Straight leg test negative, DTR symmetric (decrease in both ankles). Psych--  Cognition and judgment appear intact.  Cooperative with normal attention span and concentration.  Behavior appropriate. No anxious or depressed appearing.      Assessment     Assessment DM 2009 HTN Hyperlipidemia CV: CAD 01/2017: had CP,  stress  test show an area of fixed defect, saw cardiology, likely s/p a out-of-hospital MI at the time of CP. Rx Plavix /metoprolol  echo 02/2017 nl EF CVA 02/2018 per MRI, MRA neck head (-), echo ok; was on ASA, was rx plavix x 3 weeks  Renal: Dr Hyman HopesWebb ---Glomerulonephritis 2010, BX proven ---[facility] H/o Hematuria, urology w/u 09/2007 H/o Urinary retention 2010  PLAN: Acute lumbalgia: As described above, etiology not clear.  He recently had a viral syndrome so I like to be sure nothing really serious is going on such as discitis or acute sacroiliitis.  Otherwise we will treat as a MSK issue. Plan: Diclofenac twice a day as needed, GI precautions discussed Hydrocodone only at night if pain severe X-ray, CBC, sed rate, BMP Cold compress Call if not gradually better.  All instructions discussed in Spanish Weight loss: He looks well, nontoxic, afebrile, likely due to diet change, see above Viral syndrome: Resolved, see last visit.

## 2018-07-24 NOTE — Assessment & Plan Note (Signed)
Acute lumbalgia: As described above, etiology not clear.  He recently had a viral syndrome so I like to be sure nothing really serious is going on such as discitis or acute sacroiliitis.  Otherwise we will treat as a MSK issue. Plan: Diclofenac twice a day as needed, GI precautions discussed Hydrocodone only at night if pain severe X-ray, CBC, sed rate, BMP Cold compress Call if not gradually better.  All instructions discussed in Spanish Weight loss: He looks well, nontoxic, afebrile, likely due to diet change, see above Viral syndrome: Resolved, see last visit.

## 2018-07-30 DIAGNOSIS — I129 Hypertensive chronic kidney disease with stage 1 through stage 4 chronic kidney disease, or unspecified chronic kidney disease: Secondary | ICD-10-CM | POA: Diagnosis not present

## 2018-07-30 DIAGNOSIS — N042 Nephrotic syndrome with diffuse membranous glomerulonephritis: Secondary | ICD-10-CM | POA: Diagnosis not present

## 2018-07-30 DIAGNOSIS — D649 Anemia, unspecified: Secondary | ICD-10-CM | POA: Diagnosis not present

## 2018-08-20 ENCOUNTER — Encounter: Payer: Self-pay | Admitting: Internal Medicine

## 2018-08-20 ENCOUNTER — Ambulatory Visit (INDEPENDENT_AMBULATORY_CARE_PROVIDER_SITE_OTHER): Payer: 59 | Admitting: Internal Medicine

## 2018-08-20 VITALS — BP 126/74 | HR 69 | Temp 97.9°F | Resp 16 | Ht 73.0 in | Wt 211.0 lb

## 2018-08-20 DIAGNOSIS — Z1211 Encounter for screening for malignant neoplasm of colon: Secondary | ICD-10-CM

## 2018-08-20 DIAGNOSIS — Z23 Encounter for immunization: Secondary | ICD-10-CM

## 2018-08-20 DIAGNOSIS — E118 Type 2 diabetes mellitus with unspecified complications: Secondary | ICD-10-CM

## 2018-08-20 DIAGNOSIS — Z0000 Encounter for general adult medical examination without abnormal findings: Secondary | ICD-10-CM

## 2018-08-20 LAB — TSH: TSH: 2.09 u[IU]/mL (ref 0.35–4.50)

## 2018-08-20 LAB — LIPID PANEL
Cholesterol: 176 mg/dL (ref 0–200)
HDL: 58.9 mg/dL (ref >39.00)
LDL Cholesterol: 101 mg/dL — ABNORMAL HIGH (ref 0–99)
NonHDL: 117.37
Total CHOL/HDL Ratio: 3
Triglycerides: 81 mg/dL (ref 0.0–149.0)
VLDL: 16.2 mg/dL (ref 0.0–40.0)

## 2018-08-20 LAB — AST: AST: 26 U/L (ref 0–37)

## 2018-08-20 LAB — ALT: ALT: 22 U/L (ref 0–53)

## 2018-08-20 LAB — PSA: PSA: 1.68 ng/mL (ref 0.10–4.00)

## 2018-08-20 LAB — HEMOGLOBIN A1C: Hgb A1c MFr Bld: 6.7 % — ABNORMAL HIGH (ref 4.6–6.5)

## 2018-08-20 NOTE — Progress Notes (Signed)
Pre visit review using our clinic review tool, if applicable. No additional management support is needed unless otherwise documented below in the visit note. 

## 2018-08-20 NOTE — Progress Notes (Signed)
Subjective:    Patient ID: Calvin Hepburn Sr., male    DOB: March 26, 1956, 63 y.o.   MRN: [id number]  DOS:  08/20/2018 Type of visit - description: CPX Actually no concerns, he is doing well.  Remains active, trying to eat healthy.  Review of Systems   A 14 point review of systems is negative   Past Medical History:  Diagnosis Date  . Decreased hearing    Left  . Diabetes mellitus 2009   adult onset  . Glomerulonephritis 12/2008   bx provem membranous GN, sees renal q year  . Grave's disease    reportedly, history of i nthe past u/s 6/11 slightly enlarged gland w/o nodules  . H/O: hematuria    saw urology 3/09, neg w/u  . Hyperlipemia   . Hypertension   . Secondary hyperparathyroidism, renal (HCC)    Dr. [name]  . Stroke (HCC)   . Urinary retention 01/2009   was seen at the ER and followup by urology    Past Surgical History:  Procedure Laterality Date  . right knee surgery     around 2015    Social History   Socioeconomic History  . Marital status: Married    Spouse name: Not on file  . Number of children: 3  . Years of education: Not on file  . Highest education level: Not on file  Occupational History  . Occupation: welder  Social Needs  . Financial resource strain: Not very hard  . Food insecurity:    Worry: Patient refused    Inability: Patient refused  . Transportation needs:    Medical: Patient refused    Non-medical: Patient refused  Tobacco Use  . Smoking status: Former Smoker    Packs/day: 1.00    Last attempt to quit: 04/17/1989    Years since quitting: 29.3  . Smokeless tobacco: Never Used  Substance and Sexual Activity  . Alcohol use: No  . Drug use: No  . Sexual activity: Not on file  Lifestyle  . Physical activity:    Days per week: Patient refused    Minutes per session: Patient refused  . Stress: Only a little  Relationships  . Social connections:    Talks on phone: Patient refused    Gets together: Patient refused    Attends  religious service: Patient refused    Active member of club or organization: Patient refused    Attends meetings of clubs or organizations: Patient refused    Relationship status: Patient refused  . Intimate partner violence:    Fear of current or ex partner: Patient refused    Emotionally abused: Patient refused    Physically abused: Patient refused    Forced sexual activity: Patient refused  Other Topics Concern  . Not on file  Social History Narrative   Original from [place], moved to [place] in the 80s , lives w/ wife     Family History  Problem Relation Age of Onset  . Diabetes Other        GM  . Diabetes Maternal Grandmother   . Hypertension Maternal Grandmother   . Healthy Mother   . Heart attack Neg Hx   . Colon cancer Neg Hx   . Prostate cancer Neg Hx      Allergies as of 08/20/2018      Reactions   Penicillins    Unsure if this drug was the cause of rash      Medication List  Accurate as of August 20, 2018 11:59 PM. Always use your most recent med list.        aspirin 81 MG EC tablet Take 1 tablet (81 mg total) by mouth daily.   atorvastatin 80 MG tablet Commonly known as:  LIPITOR Take 1 tablet (80 mg total) by mouth daily.   diclofenac 50 MG EC tablet Commonly known as:  VOLTAREN Take 1 tablet (50 mg total) by mouth 2 (two) times daily.   freestyle lancets Check blood sugar once daily   glucose blood test strip Commonly known as:  FREESTYLE INSULINX TEST Test blood sugar once daily. Dx code: E11.9   losartan 100 MG tablet Commonly known as:  COZAAR Take 1 tablet (100 mg total) by mouth daily.   metFORMIN 850 MG tablet Commonly known as:  GLUCOPHAGE Take 1 tablet (850 mg total) by mouth 2 (two) times daily with a meal.   metoprolol tartrate 25 MG tablet Commonly known as:  LOPRESSOR Take 0.5 tablets (12.5 mg total) by mouth 2 (two) times daily.   nitroGLYCERIN 0.4 MG SL tablet Commonly known as:  NITROSTAT Place 1 tablet (0.4 mg  total) under the tongue every 5 (five) minutes as needed for chest pain.   pioglitazone 30 MG tablet Commonly known as:  ACTOS Take 1 tablet (30 mg total) by mouth daily.           Objective:   Physical Exam BP 126/74 (BP Location: Left Arm, Patient Position: Sitting, Cuff Size: Normal)   Pulse 69   Temp 97.9 F (36.6 C) (Oral)   Resp 16   Ht 6\' 1"  (1.854 m)   Wt 211 lb (95.7 kg)   SpO2 98%   BMI 27.84 kg/m  General: Well developed, NAD, BMI noted Neck: No  thyromegaly  HEENT:  Normocephalic . Face symmetric, atraumatic Lungs:  CTA B Normal respiratory effort, no intercostal retractions, no accessory muscle use. Heart: RRR,  no murmur.  No pretibial edema bilaterally  Abdomen:  Not distended, soft, non-tender. No rebound or rigidity.   Skin: Exposed areas without rash. Not pale. Not jaundice DRE: Normal sphincter tone, no stools.  Prostate symmetric, not tender or nodular Neurologic:  alert & oriented X3.  Speech normal, gait appropriate for age and unassisted Strength symmetric and appropriate for age.  Psych: Cognition and judgment appear intact.  Cooperative with normal attention span and concentration.  Behavior appropriate. No anxious or depressed appearing.     Assessment     Assessment DM 2009 HTN Hyperlipidemia CV: CAD 01/2017: had CP,  stress test show an area of fixed defect, saw cardiology, likely s/p a out-of-hospital MI at the time of CP. Rx Plavix /metoprolol  echo 02/2017 nl EF CVA 02/2018 per MRI, MRA neck head (-), echo ok; was on ASA, was rx plavix x 3 weeks  Renal: Dr [name] ---Glomerulonephritis 2010, BX proven ---Secondary hyperparathyroidism HOH L GU H/o Hematuria, urology w/u 09/2007 H/o Urinary retention 2010  PLAN: Here for CPX Acute lumbalgia: See last visit, resolved quickly. DM, HTN, hyperlipidemia, CAD,h/o  CVA: All seem stable, ambulatory BPs in the 120s/80s.  CBGs in the 120s. Continue same medications, checking labs RTC  6 months

## 2018-08-20 NOTE — Patient Instructions (Addendum)
Per our records you are due for an eye exam. Please contact your eye doctor to schedule an appointment. Please have them send copies of your office visit notes to [place]. Our fax number is [phone].  GO TO THE LAB : Get the blood work     GO TO THE FRONT DESK Schedule your next appointment   a routine visit in 6 months, no fasting  Also scheduled nurse appointment 2 months from today for a Shingrix shot.  Check the  blood pressure 2 or 3 times a month  Be sure your blood pressure is between 110/65 and  135/85. If it is consistently higher or lower, let me know

## 2018-08-20 NOTE — Assessment & Plan Note (Signed)
--  Td 2011;  pnm shot 09-2014; prevnar 12-2015; s/p flu shot   shingrix pro-cons discussed.  First shot today, next in 2 months. CCS::  Cscope 12-06 Dr Bosie Clos:  2 polyps Cscope again 08-2011, +polyp-. Referred to GI Prostate cancer screening: DRE normal, check a PSA. Diet and exercise discussed Labs: AST, ALT, FLP, A1c, TSH, PSA

## 2018-08-21 NOTE — Assessment & Plan Note (Signed)
Here for CPX Acute lumbalgia: See last visit, resolved quickly. DM, HTN, hyperlipidemia, CAD,h/o  CVA: All seem stable, ambulatory BPs in the 120s/80s.  CBGs in the 120s. Continue same medications, checking labs RTC 6 months

## 2018-08-23 MED ORDER — EZETIMIBE 10 MG PO TABS: 10.0000 mg | ORAL_TABLET | Freq: Every day | ORAL | 3 refills | Status: DC

## 2018-08-23 NOTE — Addendum Note (Signed)
Addended byConrad Kief D on: 08/23/2018 07:56 AM   Modules accepted: Orders

## 2018-09-17 ENCOUNTER — Ambulatory Visit: Payer: 59 | Admitting: Adult Health

## 2018-09-17 ENCOUNTER — Telehealth: Payer: Self-pay

## 2018-09-17 NOTE — Progress Notes (Deleted)
[facility] [address] [place]. [place] [address] [phone]       OFFICE FOLLOW UP NOTE  Calvin. Calvin Ennen Sr. Date of Birth:  May 03, 1956 Medical Record Number:  [id number]   Reason for Referral:  hospital stroke follow up  CHIEF COMPLAINT:  No chief complaint on file.   HPI:  09/17/18 VISIT Calvin Covin Sr. is being seen today for follow-up visit regarding right quadrant head infarct secondary to small vessel disease in 02/2018.  He continues to do well from a stroke standpoint without residual deficits or reoccurring of symptoms.  He continues on aspirin without side effects of bleeding or bruising.  He continues on atorvastatin without side effects myalgias.  Blood pressure today ***.  Denies new or worsening stroke/TIA symptoms.   INITIAL VISIT 03/19/2018: Calvin Limbo Sr. is being seen today for initial visit in the office for right caudate head infarct secondary to small vessel disease on 02/13/2018. History obtained from patient and chart review. Reviewed all radiology images and labs personally.  Calvin. Slade Roepke Sr. is a 63 y.o. male with history of HTN, CAD, DM type 2(on oral hypoglycemic agents), HLD, baseline decreased hearing, remote tobacco use(1990),glomerulonephritisand graves' disease who presented with neck pain, HA, and slurred speech. He did not receive IV t-PA due to late presentation.  CT had reviewed and was negative for acute infarct.  MRI head reviewed and show punctate focus of acute ischemia at the inferior aspect of the right caudate head.  MRA head and neck were normal.  2D echo showed an EF of 55 to 60%.  LDL 116 and recommended continuation of Lipitor 80 mg.  HTN stable and recommended long-term BP goal normotensive range.  A1c 6.7 and recommended continued follow-up with PCP for management.  Patient is not on antithrombotic PTA and recommended DAPT for 3 weeks and aspirin alone.  Patient was discharged home in stable condition without  therapy needs.  Patient is being seen today for hospital follow-up.  He has been doing well without residual deficits or recurrence of symptoms.  Denies recurrence of neck pain or headache.  He has completed 3 weeks of DAPT and currently on aspirin alone without side effects of bleeding or bruising.  Continues to take Lipitor without side effects of myalgias.  Blood pressure today 147/69.  Patient does monitor this at home with recordings and typical SBP 1 20-1 40.  He does monitor glucose levels at home and typically range from 100-1 30.  HTN, HLD and DM are all managed by his primary care provider.  He has returned back to work full-time without complication.  He has returned back to all prior activities.  Denies new or worsening stroke/TIA symptoms.   ROS:   14 system review of systems performed and negative with exception of weight loss and ringing in ears  PMH:  Past Medical History:  Diagnosis Date  . Decreased hearing    Left  . Diabetes mellitus 2009   adult onset  . Glomerulonephritis 12/2008   bx provem membranous GN, sees renal q year  . Grave's disease    reportedly, history of i nthe past u/s 6/11 slightly enlarged gland w/o nodules  . H/O: hematuria    saw urology 3/09, neg w/u  . Hyperlipemia   . Hypertension   . Secondary hyperparathyroidism, renal (HCC)    Dr. [name]  . Stroke (HCC)   . Urinary retention 01/2009   was seen at the ER and followup by urology  PSH:  Past Surgical History:  Procedure Laterality Date  . right knee surgery     around 2015    Social History:  Social History   Socioeconomic History  . Marital status: Married    Spouse name: Not on file  . Number of children: 3  . Years of education: Not on file  . Highest education level: Not on file  Occupational History  . Occupation: welder  Social Needs  . Financial resource strain: Not very hard  . Food insecurity:    Worry: Patient refused    Inability: Patient refused  .  Transportation needs:    Medical: Patient refused    Non-medical: Patient refused  Tobacco Use  . Smoking status: Former Smoker    Packs/day: 1.00    Last attempt to quit: 04/17/1989    Years since quitting: 29.4  . Smokeless tobacco: Never Used  Substance and Sexual Activity  . Alcohol use: No  . Drug use: No  . Sexual activity: Not on file  Lifestyle  . Physical activity:    Days per week: Patient refused    Minutes per session: Patient refused  . Stress: Only a little  Relationships  . Social connections:    Talks on phone: Patient refused    Gets together: Patient refused    Attends religious service: Patient refused    Active member of club or organization: Patient refused    Attends meetings of clubs or organizations: Patient refused    Relationship status: Patient refused  . Intimate partner violence:    Fear of current or ex partner: Patient refused    Emotionally abused: Patient refused    Physically abused: Patient refused    Forced sexual activity: Patient refused  Other Topics Concern  . Not on file  Social History Narrative   Original from [place], moved to [place] in the 80s , lives w/ wife    Family History:  Family History  Problem Relation Age of Onset  . Diabetes Other        GM  . Diabetes Maternal Grandmother   . Hypertension Maternal Grandmother   . Healthy Mother   . Heart attack Neg Hx   . Colon cancer Neg Hx   . Prostate cancer Neg Hx     Medications:   Current Outpatient Medications on File Prior to Visit  Medication Sig Dispense Refill  . aspirin EC 81 MG EC tablet Take 1 tablet (81 mg total) by mouth daily. 30 tablet 0  . atorvastatin (LIPITOR) 80 MG tablet Take 1 tablet (80 mg total) by mouth daily. 30 tablet 6  . diclofenac (VOLTAREN) 50 MG EC tablet Take 1 tablet (50 mg total) by mouth 2 (two) times daily. (Patient not taking: Reported on 08/20/2018) 30 tablet 0  . ezetimibe (ZETIA) 10 MG tablet Take 1 tablet (10 mg total) by mouth daily.  90 tablet 3  . glucose blood (FREESTYLE INSULINX TEST) test strip Test blood sugar once daily. Dx code: E11.9 100 each 12  . Lancets (FREESTYLE) lancets Check blood sugar once daily 100 each 12  . losartan (COZAAR) 100 MG tablet Take 1 tablet (100 mg total) by mouth daily. 30 tablet 6  . metFORMIN (GLUCOPHAGE) 850 MG tablet Take 1 tablet (850 mg total) by mouth 2 (two) times daily with a meal. 60 tablet 6  . metoprolol tartrate (LOPRESSOR) 25 MG tablet Take 0.5 tablets (12.5 mg total) by mouth 2 (two) times daily. 30 tablet 6  .  nitroGLYCERIN (NITROSTAT) 0.4 MG SL tablet Place 1 tablet (0.4 mg total) under the tongue every 5 (five) minutes as needed for chest pain. (Patient not taking: Reported on 08/20/2018) 25 tablet 3  . pioglitazone (ACTOS) 30 MG tablet Take 1 tablet (30 mg total) by mouth daily. 30 tablet 6   No current facility-administered medications on file prior to visit.     Allergies:   Allergies  Allergen Reactions  . Penicillins     Unsure if this drug was the cause of rash     Physical Exam  There were no vitals filed for this visit. There is no height or weight on file to calculate BMI. No exam data present  General: well developed, well nourished, pleasant middle-aged Hispanic male, seated, in no evident distress Head: head normocephalic and atraumatic.   Neck: supple with no carotid or supraclavicular bruits Cardiovascular: regular rate and rhythm, no murmurs Musculoskeletal: no deformity Skin:  no rash/petichiae Vascular:  Normal pulses all extremities  Neurologic Exam Mental Status: Awake and fully alert. Oriented to place and time. Recent and remote memory intact. Attention span, concentration and fund of knowledge appropriate. Mood and affect appropriate.  Cranial Nerves: Fundoscopic exam reveals sharp disc margins. Pupils equal, briskly reactive to light. Extraocular movements full without nystagmus. Visual fields full to confrontation. Hearing intact. Facial  sensation intact. Face, tongue, palate moves normally and symmetrically.  Motor: Normal bulk and tone. Normal strength in all tested extremity muscles. Sensory.: intact to touch , pinprick , position and vibratory sensation.  Coordination: Rapid alternating movements normal in all extremities. Finger-to-nose and heel-to-shin performed accurately bilaterally. Gait and Station: Arises from chair without difficulty. Stance is normal. Gait demonstrates normal stride length and balance . Able to heel, toe and tandem walk without difficulty.  Reflexes: 1+ and symmetric. Toes downgoing.    NIHSS  0 Modified Rankin  0    Diagnostic Data (Labs, Imaging, Testing) I have personally reviewed the radiological images below and agree with the radiology interpretations.  Ct Cervical Spine Wo Williams Ct Head Wo Williams 02/13/2018 IMPRESSION:  1. No evidence of traumatic intracranial injury or fracture.  2. No evidence of fracture or subluxation along the cervical spine.  3. Mild cortical volume loss noted.  4. Mild mucosal thickening at the right maxillary sinus and sphenoid sinus.  5. Mild calcification at the carotid bifurcations bilaterally.   Calvin Williams Calvin Williams Neck W Wo Williams 02/13/2018 IMPRESSION:  1. Normal MRI of the neck without and with Williams. No focal stenosis or vascular lesion.  2. Normal variant MRA circle-of-Willis without significant proximal stenosis, aneurysm, or branch vessel occlusion.   Calvin Williams 02/13/2018 IMPRESSION:  Punctate focus of acute ischemia at the inferior aspect of the right caudate head. No hemorrhage or mass effect.   Transthoracic Echocardiogram 02/14/2018 Study Conclusions - Left ventricle: The cavity size was normal. Wall thickness was   increased in a pattern of mild LVH. Systolic function was normal.   The estimated ejection fraction was in the range of 55% to 60%.   Wall motion was normal; there were no regional wall  motion   abnormalities. Doppler parameters are consistent with abnormal   left ventricular relaxation (grade 1 diastolic dysfunction). - Aortic valve: There was no stenosis. - Aorta: Mildly dilated aortic root. Aortic root dimension: 38 mm   (ED). - Mitral valve: There was no significant regurgitation. - Right ventricle: The cavity size was normal. Systolic function  was normal. - Pulmonary arteries: No complete TR doppler jet so unable to   estimate PA systolic pressure. - Inferior vena cava: The vessel was normal in size. The   respirophasic diameter changes were in the normal range (>= 50%),   consistent with normal central venous pressure.   ASSESSMENT: Calvin Pitcher Sr. is a 63 y.o. year old male here with right caudate head infarct on 02/13/2018 secondary to small vessel disease. Vascular risk factors include HTN, HLD, CAD and DM.  Patient is being seen today for hospital follow-up and overall doing well without residual deficits or recurring symptoms.    PLAN: -Continue aspirin 81 mg daily  and Lipitor 80 mg for secondary stroke prevention -F/u with PCP regarding your HLD, HTN and DM management -continue to monitor BP at home -advised to continue to stay active and maintain a healthy diet -Maintain strict control of hypertension with blood pressure goal below 130/90, diabetes with hemoglobin A1c goal below 6.5% and cholesterol with LDL cholesterol (bad cholesterol) goal below 70 mg/dL. I also advised the patient to eat a healthy diet with plenty of whole grains, cereals, fruits and vegetables, exercise regularly and maintain ideal body weight.  Follow up in 6 months as patient stable from stroke standpoint and is followed closely by PCP for risk factor management.  Advised patient to call earlier if needed.   Greater than 50% of time during this 25 minute visit was spent on counseling,explanation of diagnosis of right caudate head infarct, reviewing risk factor management of HLD,  HTN, CAD and DM, planning of further management, discussion with patient and family and coordination of care    George Hugh, AGNP-BC  [facility] [address] [place], [place] [address]  Phone [phone] Fax [phone] Note: This document was prepared with digital dictation and possible smart phrase technology. Any transcriptional errors that result from this process are unintentional.

## 2018-09-17 NOTE — Telephone Encounter (Signed)
PT no show for appt today. 

## 2018-10-22 ENCOUNTER — Ambulatory Visit (INDEPENDENT_AMBULATORY_CARE_PROVIDER_SITE_OTHER): Payer: 59

## 2018-10-22 ENCOUNTER — Other Ambulatory Visit: Payer: Self-pay

## 2018-10-22 DIAGNOSIS — Z23 Encounter for immunization: Secondary | ICD-10-CM

## 2018-11-10 ENCOUNTER — Other Ambulatory Visit: Payer: Self-pay | Admitting: Internal Medicine

## 2019-02-18 ENCOUNTER — Other Ambulatory Visit: Payer: Self-pay

## 2019-02-18 ENCOUNTER — Encounter: Payer: Self-pay | Admitting: Internal Medicine

## 2019-02-18 ENCOUNTER — Ambulatory Visit (INDEPENDENT_AMBULATORY_CARE_PROVIDER_SITE_OTHER): Payer: 59 | Admitting: Internal Medicine

## 2019-02-18 VITALS — BP 152/86 | HR 64 | Temp 97.6°F | Resp 16 | Ht 73.0 in | Wt 210.0 lb

## 2019-02-18 DIAGNOSIS — E782 Mixed hyperlipidemia: Secondary | ICD-10-CM | POA: Diagnosis not present

## 2019-02-18 DIAGNOSIS — E118 Type 2 diabetes mellitus with unspecified complications: Secondary | ICD-10-CM

## 2019-02-18 DIAGNOSIS — I10 Essential (primary) hypertension: Secondary | ICD-10-CM

## 2019-02-18 LAB — BASIC METABOLIC PANEL
BUN: 21 mg/dL (ref 6–23)
CO2: 27 mEq/L (ref 19–32)
Calcium: 9.4 mg/dL (ref 8.4–10.5)
Chloride: 104 mEq/L (ref 96–112)
Creatinine, Ser: 1.22 mg/dL (ref 0.40–1.50)
GFR: 59.71 mL/min — ABNORMAL LOW (ref >60.00)
Glucose, Bld: 116 mg/dL — ABNORMAL HIGH (ref 70–99)
Potassium: 3.8 mEq/L (ref 3.5–5.1)
Sodium: 138 mEq/L (ref 135–145)

## 2019-02-18 LAB — LIPID PANEL
Cholesterol: 164 mg/dL (ref 0–200)
HDL: 52.3 mg/dL (ref >39.00)
LDL Cholesterol: 92 mg/dL (ref 0–99)
NonHDL: 111.32
Total CHOL/HDL Ratio: 3
Triglycerides: 97 mg/dL (ref 0.0–149.0)
VLDL: 19.4 mg/dL (ref 0.0–40.0)

## 2019-02-18 LAB — HEMOGLOBIN A1C: Hgb A1c MFr Bld: 6.5 % (ref 4.6–6.5)

## 2019-02-18 NOTE — Patient Instructions (Signed)
GO TO THE LAB : Get the blood work     GO TO THE FRONT DESK Schedule your next appointment   For a physical in 6 months    Check the  blood pressure   weekly   BP GOAL is between 110/65 and  135/85. If it is consistently higher or lower, let me know

## 2019-02-18 NOTE — Progress Notes (Signed)
Subjective:    Patient ID: Calvin LimboJorge Squibb Sr., male    DOB: 09-Jan-1956, 63 y.o.   MRN: [id number]  DOS:  02/18/2019 Type of visit - description: Routine office visit Since the last office visit, decided not to take Zetia. Lifestyle remains very good. Ambulatory BPs in the low 140s/80  Review of Systems Denies chest pain difficulty breathing.  No lower extremity edema No nausea, vomiting, diarrhea  Past Medical History:  Diagnosis Date   Decreased hearing    Left   Diabetes mellitus 2009   adult onset   Glomerulonephritis 12/2008   bx provem membranous GN, sees renal q year   Grave's disease    reportedly, history of i nthe past u/s 6/11 slightly enlarged gland w/o nodules   H/O: hematuria    saw urology 3/09, neg w/u   Hyperlipemia    Hypertension    Secondary hyperparathyroidism, renal (HCC)    Dr. Hyman HopesWebb   Stroke [facility](HCC)    Urinary retention 01/2009   was seen at the ER and followup by urology    Past Surgical History:  Procedure Laterality Date   right knee surgery     around 2015    Social History   Socioeconomic History   Marital status: Married    Spouse name: Not on file   Number of children: 3   Years of education: Not on file   Highest education level: Not on file  Occupational History   Occupation: welder  Ecologistocial Needs   Financial resource strain: Not very hard   Food insecurity    Worry: Patient refused    Inability: Patient refused   Transportation needs    Medical: Patient refused    Non-medical: Patient refused  Tobacco Use   Smoking status: Former Smoker    Packs/day: 1.00    Quit date: 04/17/1989    Years since quitting: 29.8   Smokeless tobacco: Never Used  Substance and Sexual Activity   Alcohol use: No   Drug use: No   Sexual activity: Not on file  Lifestyle   Physical activity    Days per week: Patient refused    Minutes per session: Patient refused   Stress: Only a little  Relationships   Dentistocial  connections    Talks on phone: Patient refused    Gets together: Patient refused    Attends religious service: Patient refused    Active member of club or organization: Patient refused    Attends meetings of clubs or organizations: Patient refused    Relationship status: Patient refused   Intimate partner violence    Fear of current or ex partner: Patient refused    Emotionally abused: Patient refused    Physically abused: Patient refused    Forced sexual activity: Patient refused  Other Topics Concern   Not on file  Social History Narrative   Original from [place], moved to [place] in the 80s , lives w/ wife      Allergies as of 02/18/2019      Reactions   Penicillins    Unsure if this drug was the cause of rash      Medication List       Accurate as of February 18, 2019 11:59 PM. If you have any questions, ask your nurse or doctor.        STOP taking these medications   diclofenac 50 MG EC tablet Commonly known as: VOLTAREN Stopped by: Willow OraJose Brycelyn Gambino, MD   ezetimibe 10 MG tablet Commonly  known as: Zetia Stopped by: Kathlene November, MD     TAKE these medications   aspirin 81 MG EC tablet Take 1 tablet (81 mg total) by mouth daily.   atorvastatin 80 MG tablet Commonly known as: LIPITOR Take 1 tablet (80 mg total) by mouth daily.   freestyle lancets Check blood sugar once daily   glucose blood test strip Commonly known as: FreeStyle InsuLinx Test Test blood sugar once daily. Dx code: E11.9   losartan 100 MG tablet Commonly known as: COZAAR Take 1 tablet (100 mg total) by mouth daily.   metFORMIN 850 MG tablet Commonly known as: Glucophage Take 1 tablet (850 mg total) by mouth 2 (two) times daily with a meal.   metoprolol tartrate 25 MG tablet Commonly known as: LOPRESSOR Take 0.5 tablets (12.5 mg total) by mouth 2 (two) times daily.   nitroGLYCERIN 0.4 MG SL tablet Commonly known as: NITROSTAT Place 1 tablet (0.4 mg total) under the tongue every 5 (five) minutes as  needed for chest pain.   pioglitazone 30 MG tablet Commonly known as: ACTOS Take 1 tablet (30 mg total) by mouth daily.           Objective:   Physical Exam BP (!) 152/86 (BP Location: Right Arm, Patient Position: Sitting, Cuff Size: Large)    Pulse 64    Temp 97.6 F (36.4 C) (Temporal)    Resp 16    Ht 6\' 1"  (1.854 m)    Wt 210 lb (95.3 kg)    SpO2 99%    BMI 27.71 kg/m  General:   Well developed, NAD, BMI noted. HEENT:  Normocephalic . Face symmetric, atraumatic Lungs:  CTA B Normal respiratory effort, no intercostal retractions, no accessory muscle use. Heart: RRR,  no murmur.  No pretibial edema bilaterally  Skin: Not pale. Not jaundice Neurologic:  alert & oriented X3.  Speech normal, gait appropriate for age and unassisted Psych--  Cognition and judgment appear intact.  Cooperative with normal attention span and concentration.  Behavior appropriate. No anxious or depressed appearing.      Assessment    Assessment DM 2009 HTN Hyperlipidemia CV: CAD 01/2017: had CP,  stress test show an area of fixed defect, saw cardiology, likely s/p a out-of-hospital MI at the time of CP. Rx Plavix /metoprolol  echo 02/2017 nl EF CVA 02/2018 per MRI, MRA neck head (-), echo ok; was on ASA, was rx plavix x 3 weeks  Renal: Dr Justin Mend ---Glomerulonephritis 2010, BX proven ---Secondary hyperparathyroidism HOH L GU H/o Hematuria, urology w/u 09/2007 H/o Urinary retention 2010  PLAN: [id number] on metformin pioglitazone, check A1c HTN, currently on losartan low-dose metoprolol.  Ambulatory BPs in the low 140s over 80.  Ideally I would like to see his BP in the 130s.  He is very close to goal, no change for now. High cholesterol: Last LDL was 101, on Lipitor 80 mg, I recommended to add Zetia, patient declined it.  Does not like to take "too much medication".  He already has a healthy lifestyle Plan: Recheck FLP, consider switch to Crestor. RTC CPX 6 months

## 2019-02-19 NOTE — Assessment & Plan Note (Signed)
[id number] on metformin pioglitazone, check A1c HTN, currently on losartan low-dose metoprolol.  Ambulatory BPs in the low 140s over 80.  Ideally I would like to see his BP in the 130s.  He is very close to goal, no change for now. High cholesterol: Last LDL was 101, on Lipitor 80 mg, I recommended to add Zetia, patient declined it.  Does not like to take "too much medication".  He already has a healthy lifestyle Plan: Recheck FLP, consider switch to Crestor. RTC CPX 6 months

## 2019-04-18 ENCOUNTER — Other Ambulatory Visit: Payer: Self-pay | Admitting: Internal Medicine

## 2019-04-18 DIAGNOSIS — E118 Type 2 diabetes mellitus with unspecified complications: Secondary | ICD-10-CM

## 2019-05-20 ENCOUNTER — Telehealth: Payer: Self-pay | Admitting: Internal Medicine

## 2019-05-20 NOTE — Telephone Encounter (Signed)
Form is in red folder for signature.

## 2019-05-20 NOTE — Telephone Encounter (Signed)
Patient's wife came into the office to drop off a form to be completed by Dr. Larose Kells for his insurance. Placed in provider tray. Please call when ready for pick up.

## 2019-05-21 ENCOUNTER — Other Ambulatory Visit: Payer: Self-pay

## 2019-05-21 DIAGNOSIS — Z20822 Contact with and (suspected) exposure to covid-19: Secondary | ICD-10-CM

## 2019-05-21 NOTE — Addendum Note (Signed)
Addended by: Luisa Dago A on: 05/21/2019 04:02 PM   Modules accepted: Orders

## 2019-05-23 LAB — NOVEL CORONAVIRUS, NAA: SARS-CoV-2, NAA: NOT DETECTED

## 2019-05-23 NOTE — Telephone Encounter (Signed)
Spoke w/ Pt- informed that form is ready for pick up at his convenience. Copy of form sent for scanning.

## 2019-05-23 NOTE — Telephone Encounter (Signed)
signed

## 2019-07-29 ENCOUNTER — Other Ambulatory Visit: Payer: Self-pay | Admitting: Internal Medicine

## 2019-08-02 ENCOUNTER — Ambulatory Visit: Payer: 59

## 2019-08-11 ENCOUNTER — Ambulatory Visit: Payer: 59 | Attending: Internal Medicine

## 2019-08-11 DIAGNOSIS — Z23 Encounter for immunization: Secondary | ICD-10-CM | POA: Insufficient documentation

## 2019-08-11 NOTE — Progress Notes (Signed)
   [facility]  Name:  Calvin Guzzetta Sr.    MRN: [id number] DOB: 01/28/1956  08/11/2019  Calvin Williams was observed post Covid-19 immunization for 15 minutes without incidence. He was provided with Vaccine Information Sheet and instruction to access the V-Safe system.   Calvin Williams was instructed to call 911 with any severe reactions post vaccine: Marland Kitchen Difficulty breathing  . Swelling of your face and throat  . A fast heartbeat  . A bad rash all over your body  . Dizziness and weakness    Immunizations Administered    Name Date Dose VIS Date Route   Pfizer COVID-19 Vaccine 08/11/2019  2:26 PM 0.3 mL 06/17/2019 Intramuscular   Manufacturer: [employer], [employer]   Lot: [id number]   NDC: [address]-3

## 2019-08-13 ENCOUNTER — Ambulatory Visit: Payer: 59

## 2019-08-19 ENCOUNTER — Other Ambulatory Visit: Payer: Self-pay | Admitting: Internal Medicine

## 2019-08-26 ENCOUNTER — Encounter: Payer: 59 | Admitting: Internal Medicine

## 2019-09-05 ENCOUNTER — Ambulatory Visit: Payer: Medicare HMO | Attending: Internal Medicine

## 2019-09-05 DIAGNOSIS — Z23 Encounter for immunization: Secondary | ICD-10-CM | POA: Insufficient documentation

## 2019-09-05 NOTE — Progress Notes (Signed)
   [facility]  Name:  Calvin Dafoe Sr.    MRN: [id number] DOB: 09-03-1955  09/05/2019  [name] was observed post Covid-19 immunization for 15 minutes without incidence. He was provided with Vaccine Information Sheet and instruction to access the V-Safe system.   [name] was instructed to call 911 with any severe reactions post vaccine: Marland Kitchen Difficulty breathing  . Swelling of your face and throat  . A fast heartbeat  . A bad rash all over your body  . Dizziness and weakness    Immunizations Administered    Name Date Dose VIS Date Route   Pfizer COVID-19 Vaccine 09/05/2019  4:00 PM 0.3 mL 06/17/2019 Intramuscular   Manufacturer: [employer], [employer]   Lot: [id number]   NDC: [address]-9

## 2019-09-27 ENCOUNTER — Other Ambulatory Visit: Payer: Self-pay

## 2019-09-30 ENCOUNTER — Encounter: Payer: Self-pay | Admitting: Internal Medicine

## 2019-09-30 ENCOUNTER — Other Ambulatory Visit: Payer: Self-pay

## 2019-09-30 ENCOUNTER — Ambulatory Visit (INDEPENDENT_AMBULATORY_CARE_PROVIDER_SITE_OTHER): Payer: Medicare HMO | Admitting: Internal Medicine

## 2019-09-30 VITALS — BP 128/88 | HR 69 | Temp 98.7°F | Resp 16 | Ht 73.0 in | Wt 219.1 lb

## 2019-09-30 DIAGNOSIS — Z0000 Encounter for general adult medical examination without abnormal findings: Secondary | ICD-10-CM | POA: Diagnosis not present

## 2019-09-30 DIAGNOSIS — E782 Mixed hyperlipidemia: Secondary | ICD-10-CM | POA: Diagnosis not present

## 2019-09-30 DIAGNOSIS — I10 Essential (primary) hypertension: Secondary | ICD-10-CM | POA: Diagnosis not present

## 2019-09-30 DIAGNOSIS — Z91030 Bee allergy status: Secondary | ICD-10-CM | POA: Diagnosis not present

## 2019-09-30 DIAGNOSIS — Z1211 Encounter for screening for malignant neoplasm of colon: Secondary | ICD-10-CM

## 2019-09-30 DIAGNOSIS — R1013 Epigastric pain: Secondary | ICD-10-CM | POA: Diagnosis not present

## 2019-09-30 DIAGNOSIS — E118 Type 2 diabetes mellitus with unspecified complications: Secondary | ICD-10-CM | POA: Diagnosis not present

## 2019-09-30 LAB — LIPID PANEL
Cholesterol: 158 mg/dL (ref 0–200)
HDL: 51.6 mg/dL (ref >39.00)
LDL Cholesterol: 89 mg/dL (ref 0–99)
NonHDL: 106.83
Total CHOL/HDL Ratio: 3
Triglycerides: 89 mg/dL (ref 0.0–149.0)
VLDL: 17.8 mg/dL (ref 0.0–40.0)

## 2019-09-30 LAB — COMPREHENSIVE METABOLIC PANEL
ALT: 24 U/L (ref 0–53)
AST: 26 U/L (ref 0–37)
Albumin: 4.1 g/dL (ref 3.5–5.2)
Alkaline Phosphatase: 91 U/L (ref 39–117)
BUN: 19 mg/dL (ref 6–23)
CO2: 30 mEq/L (ref 19–32)
Calcium: 9.3 mg/dL (ref 8.4–10.5)
Chloride: 102 mEq/L (ref 96–112)
Creatinine, Ser: 1.16 mg/dL (ref 0.40–1.50)
GFR: 63.17 mL/min (ref >60.00)
Glucose, Bld: 125 mg/dL — ABNORMAL HIGH (ref 70–99)
Potassium: 3.9 mEq/L (ref 3.5–5.1)
Sodium: 138 mEq/L (ref 135–145)
Total Bilirubin: 1.4 mg/dL — ABNORMAL HIGH (ref 0.2–1.2)
Total Protein: 6.7 g/dL (ref 6.0–8.3)

## 2019-09-30 LAB — CBC WITH DIFFERENTIAL/PLATELET
Basophils Absolute: 0 10*3/uL (ref 0.0–0.1)
Basophils Relative: 0.8 % (ref 0.0–3.0)
Eosinophils Absolute: 0.1 10*3/uL (ref 0.0–0.7)
Eosinophils Relative: 1.4 % (ref 0.0–5.0)
HCT: 43.5 % (ref 39.0–52.0)
Hemoglobin: 15.1 g/dL (ref 13.0–17.0)
Lymphocytes Relative: 24.8 % (ref 12.0–46.0)
Lymphs Abs: 1.2 10*3/uL (ref 0.7–4.0)
MCHC: 34.8 g/dL (ref 30.0–36.0)
MCV: 87.2 fl (ref 78.0–100.0)
Monocytes Absolute: 0.4 10*3/uL (ref 0.1–1.0)
Monocytes Relative: 7.7 % (ref 3.0–12.0)
Neutro Abs: 3.2 10*3/uL (ref 1.4–7.7)
Neutrophils Relative %: 65.3 % (ref 43.0–77.0)
Platelets: 165 10*3/uL (ref 150.0–400.0)
RBC: 4.99 Mil/uL (ref 4.22–5.81)
RDW: 13.7 % (ref 11.5–15.5)
WBC: 4.9 10*3/uL (ref 4.0–10.5)

## 2019-09-30 LAB — HEMOGLOBIN A1C: Hgb A1c MFr Bld: 6.5 % (ref 4.6–6.5)

## 2019-09-30 MED ORDER — METOPROLOL TARTRATE 25 MG PO TABS: 25.0000 mg | ORAL_TABLET | Freq: Two times a day (BID) | ORAL | 1 refills | Status: DC

## 2019-09-30 MED ORDER — EPINEPHRINE 0.3 MG/0.3ML IJ SOAJ: 0.3000 mg | INTRAMUSCULAR | 2 refills | Status: DC | PRN

## 2019-09-30 NOTE — Progress Notes (Signed)
Pre visit review using our clinic review tool, if applicable. No additional management support is needed unless otherwise documented below in the visit note. 

## 2019-09-30 NOTE — Progress Notes (Signed)
Subjective:    Patient ID: Calvin Ballin Sr., male    DOB: [birth year], 64 y.o.   MRN: [id number]  DOS:  09/30/2019 Type of visit - description: cpx  We also talk about hypertension, diabetes and cholesterol. Report episodic mid to lower abdominal discomfort, decreased with Rolaids, not described as burning. Also in the last month had postprandial nausea couple of times along with vomiting. Denies heartburn per se, no dysphagia or odynophagia. He is active without chest pain or difficulty breathing.  Review of Systems  Other than above, a 14 point review of systems is negative     Past Medical History:  Diagnosis Date  . Decreased hearing    Left  . Diabetes mellitus 2009   adult onset  . Glomerulonephritis 12/2008   bx provem membranous GN, sees renal q year  . Grave's disease    reportedly, history of i nthe past u/s 6/11 slightly enlarged gland w/o nodules  . H/O: hematuria    saw urology 3/09, neg w/u  . Hyperlipemia   . Hypertension   . Secondary hyperparathyroidism, renal ([place])    Dr. Justin Mend  . Stroke ([place])   . Urinary retention 01/2009   was seen at the ER and followup by urology    Past Surgical History:  Procedure Laterality Date  . right knee surgery     around 2015   Family History  Problem Relation Age of Onset  . Diabetes Other        GM  . Diabetes Maternal Grandmother   . Hypertension Maternal Grandmother   . Healthy Mother   . Heart attack Neg Hx   . Colon cancer Neg Hx   . Prostate cancer Neg Hx     Allergies as of 09/30/2019      Reactions   Bee Venom Other (See Comments)   Unknown- Pt given Epi-pen   Penicillins    Unsure if this drug was the cause of rash      Medication List       Accurate as of September 30, 2019  2:41 PM. If you have any questions, ask your nurse or doctor.        aspirin 81 MG EC tablet Take 1 tablet (81 mg total) by mouth daily.   atorvastatin 80 MG tablet Commonly known as: LIPITOR Take 1 tablet (80 mg  total) by mouth daily.   EPINEPHrine 0.3 mg/0.3 mL Soaj injection Commonly known as: EpiPen 2-Pak Inject 0.3 mLs (0.3 mg total) into the muscle as needed for anaphylaxis. Started by: Kathlene November, MD   freestyle lancets Check blood sugar once daily   glucose blood test strip Commonly known as: FreeStyle InsuLinx Test Test blood sugar once daily. Dx code: E11.9   losartan 100 MG tablet Commonly known as: COZAAR Take 1 tablet (100 mg total) by mouth daily.   metFORMIN 850 MG tablet Commonly known as: GLUCOPHAGE Take 1 tablet (850 mg total) by mouth 2 (two) times daily with a meal.   metoprolol tartrate 25 MG tablet Commonly known as: LOPRESSOR Take 1 tablet (25 mg total) by mouth 2 (two) times daily. What changed: how much to take Changed by: Kathlene November, MD   nitroGLYCERIN 0.4 MG SL tablet Commonly known as: NITROSTAT Place 1 tablet (0.4 mg total) under the tongue every 5 (five) minutes as needed for chest pain.   pioglitazone 30 MG tablet Commonly known as: ACTOS Take 1 tablet (30 mg total) by mouth daily.  Objective:   Physical Exam BP 128/88   Pulse 69   Temp 98.7 F (37.1 C) (Temporal)   Resp 16   Ht 6\' 1"  (1.854 m)   Wt 219 lb 2 oz (99.4 kg)   SpO2 100%   BMI 28.91 kg/m  General: Well developed, NAD, BMI noted Neck: No  thyromegaly  HEENT:  Normocephalic . Face symmetric, atraumatic Lungs:  CTA B Normal respiratory effort, no intercostal retractions, no accessory muscle use. Heart: RRR,  no murmur.  Abdomen:  Not distended, soft, non-tender. No rebound or rigidity.   Lower extremities: no pretibial edema bilaterally  Skin: Exposed areas without rash. Not pale. Not jaundice Neurologic:  alert & oriented X3.  Speech normal, gait appropriate for age and unassisted Strength symmetric and appropriate for age.  Psych: Cognition and judgment appear intact.  Cooperative with normal attention span and concentration.  Behavior appropriate. No  anxious or depressed appearing.     Assessment     Assessment DM 2009 HTN Hyperlipidemia CV: CAD 01/2017: had CP,  stress test show an area of fixed defect, saw cardiology, likely s/p a out-of-hospital MI at the time of CP. Rx Plavix /metoprolol  echo 02/2017 nl EF CVA 02/2018 per MRI, MRA neck head (-), echo ok; was on ASA, was rx plavix x 3 weeks  Renal: Dr 03/2018 ---Glomerulonephritis 2010, BX proven ---Secondary hyperparathyroidism HOH L GU H/o Hematuria, urology w/u 09/2007 H/o Urinary retention 2010 BEE allergies, epipen Rx 09/2019  PLAN: Here for CPX DM: Currently on Metformin, Actos, no ambulatory CBGs recently, doing okay with diet and exercise, check A1c HTN: Currently on losartan 100 mg and Metoprolol 25 mg 0.5 tablets twice a day, heart rate 69.  BP today is elevated, no recent ambulatory BPs but typically a little high at home. BP recheck 128/88.   Plan: Increase metoprolol 25 mg 1 tab BID (d/t BP elevated at home), continue losartan, reassess in 3 months. CAD: Likely had out-of-hospital MI in 2018, asymptomatic.  Aggressive CV RF control indicated High cholesterol: LDL goal 70, currently on Lipitor and not at goal, previously declining to start Zetia or switch to Crestor. Discussed goals with patient, check FLP. Bee allergies: Reported history of bee allergies and request a EpiPen.  Will do Dyspepsia: As described above, referring to GI for a colonoscopy, advised to discuss dyspepsia with them.  Call if symptoms increase.  Recommend OTC Nexium once a day. RTC 3 months  In addition to CPX we spent more than 20 minutes with the patient assessing his chronic medical problems including DM, HTN, previous CAD, high cholesterol, dyspepsia and allergies.     This visit occurred during the SARS-CoV-2 public health emergency.  Safety protocols were in place, including screening questions prior to the visit, additional usage of staff PPE, and extensive cleaning of exam room while  observing appropriate contact time as indicated for disinfecting solutions.

## 2019-09-30 NOTE — Patient Instructions (Addendum)
Per our records you are due for an eye exam. Please contact your eye doctor to schedule an appointment. Please have them send copies of your office visit notes to [place]. Our fax number is [phone].   Start Nexium over-the-counter 1 tablet before breakfast  Referring you to Dr. Bosie Clos, gastroenterology for a colonoscopy, tell him about your stomach problems  Increase metoprolol 25 mg to 1 tablet twice a day  Check the  blood pressure weekly BP GOAL is between 110/65 and  135/85. If it is consistently higher or lower, let me know    GO TO THE LAB : Get the blood work     GO TO THE FRONT DESK, please reschedule your appointments Come back for a checkup in 3 months

## 2019-09-30 NOTE — Assessment & Plan Note (Signed)
--  Td 2011 -pnm shot 09-2014 -prevnar6-2017 - s/p shingrix x 2 - covid vaccine x 2  CCS::  Cscope 12-06 Dr Bosie Clos: 2 polyps Cscope again 08-2011, +polyp-. Failed referral to GI before, will try again Prostate cancer screening: DRE - PSA wnl 2020 Diet and exercise discussed Labs: CMP, FLP, CBC, A1c

## 2019-09-30 NOTE — Assessment & Plan Note (Signed)
Here for CPX DM: Currently on Metformin, Actos, no ambulatory CBGs recently, doing okay with diet and exercise, check A1c HTN: Currently on losartan 100 mg and Metoprolol 25 mg 0.5 tablets twice a day, heart rate 69.  BP today is elevated, no recent ambulatory BPs but typically a little high at home. BP recheck 128/88.   Plan: Increase metoprolol 25 mg 1 tab BID (d/t BP elevated at home), continue losartan, reassess in 3 months. CAD: Likely had out-of-hospital MI in 2018, asymptomatic.  Aggressive CV RF control indicated High cholesterol: LDL goal 70, currently on Lipitor and not at goal, previously declining to start Zetia or switch to Crestor. Discussed goals with patient, check FLP. Bee allergies: Reported history of bee allergies and request a EpiPen.  Will do Dyspepsia: As described above, referring to GI for a colonoscopy, advised to discuss dyspepsia with them.  Call if symptoms increase.  Recommend OTC Nexium once a day. RTC 3 months

## 2019-10-03 MED ORDER — EZETIMIBE 10 MG PO TABS: 10.0000 mg | ORAL_TABLET | Freq: Every day | ORAL | 3 refills | Status: DC

## 2019-10-03 NOTE — Addendum Note (Signed)
Addended byConrad White Marsh D on: 10/03/2019 12:43 PM   Modules accepted: Orders

## 2019-11-10 ENCOUNTER — Other Ambulatory Visit: Payer: Self-pay | Admitting: Internal Medicine

## 2019-11-15 ENCOUNTER — Other Ambulatory Visit: Payer: Self-pay | Admitting: Internal Medicine

## 2019-11-28 ENCOUNTER — Ambulatory Visit (INDEPENDENT_AMBULATORY_CARE_PROVIDER_SITE_OTHER): Payer: Medicare HMO | Admitting: Internal Medicine

## 2019-11-28 ENCOUNTER — Encounter: Payer: Self-pay | Admitting: Internal Medicine

## 2019-11-28 ENCOUNTER — Other Ambulatory Visit: Payer: Self-pay

## 2019-11-28 VITALS — BP 146/82 | HR 64 | Temp 98.6°F | Resp 18 | Ht 73.0 in | Wt 214.5 lb

## 2019-11-28 DIAGNOSIS — E782 Mixed hyperlipidemia: Secondary | ICD-10-CM

## 2019-11-28 DIAGNOSIS — M25561 Pain in right knee: Secondary | ICD-10-CM

## 2019-11-28 DIAGNOSIS — I10 Essential (primary) hypertension: Secondary | ICD-10-CM

## 2019-11-28 DIAGNOSIS — G8929 Other chronic pain: Secondary | ICD-10-CM | POA: Diagnosis not present

## 2019-11-28 MED ORDER — EZETIMIBE 10 MG PO TABS: 10.0000 mg | ORAL_TABLET | Freq: Every day | ORAL | 3 refills | Status: DC

## 2019-11-28 MED FILL — EZETIMIBE 10 MG TABS: 10 | 90 days supply | Qty: 90 | Fill #0

## 2019-11-28 NOTE — Progress Notes (Signed)
Subjective:    Patient ID: Calvin Nancarrow Sr., male    DOB: 1955-10-10, 64 y.o.   MRN: [id number]  DOS:  11/28/2019 Type of visit - description: Acute 1 week history of pain and swelling of the right knee. The pain is more noticeable on the front and typically when he walks (worse when he walks downhill). He uses his bicycle with no problems. We also talk about cholesterol and hypertension.   Review of Systems See above   Past Medical History:  Diagnosis Date  . Decreased hearing    Left  . Diabetes mellitus 2009   adult onset  . Glomerulonephritis 12/2008   bx provem membranous GN, sees renal q year  . Grave's disease    reportedly, history of i nthe past u/s 6/11 slightly enlarged gland w/o nodules  . H/O: hematuria    saw urology 3/09, neg w/u  . Hyperlipemia   . Hypertension   . Secondary hyperparathyroidism, renal (HCC)    Dr. [name]  . Stroke ([facility]CC)   . Urinary retention 01/2009   was seen at the ER and followup by urology    Past Surgical History:  Procedure Laterality Date  . right knee surgery     around 2015    Allergies as of 11/28/2019      Reactions   Bee Venom Other (See Comments)   Unknown- Pt given Epi-pen   Penicillins    Unsure if this drug was the cause of rash      Medication List       Accurate as of Nov 28, 2019  3:55 PM. If you have any questions, ask your nurse or doctor.        aspirin 81 MG EC tablet Take 1 tablet (81 mg total) by mouth daily.   atorvastatin 80 MG tablet Commonly known as: LIPITOR Take 1 tablet (80 mg total) by mouth daily.   EPINEPHrine 0.3 mg/0.3 mL Soaj injection Commonly known as: EpiPen 2-Pak Inject 0.3 mLs (0.3 mg total) into the muscle as needed for anaphylaxis.   ezetimibe 10 MG tablet Commonly known as: Zetia Take 1 tablet (10 mg total) by mouth daily.   freestyle lancets Check blood sugar once daily   glucose blood test strip Commonly known as: FreeStyle InsuLinx Test Test blood sugar once  daily. Dx code: E11.9   losartan 100 MG tablet Commonly known as: COZAAR Take 1 tablet (100 mg total) by mouth daily.   metFORMIN 850 MG tablet Commonly known as: GLUCOPHAGE Take 1 tablet (850 mg total) by mouth 2 (two) times daily with a meal.   metoprolol tartrate 25 MG tablet Commonly known as: LOPRESSOR Take 1 tablet (25 mg total) by mouth 2 (two) times daily.   nitroGLYCERIN 0.4 MG SL tablet Commonly known as: NITROSTAT Place 1 tablet (0.4 mg total) under the tongue every 5 (five) minutes as needed for chest pain.   pioglitazone 30 MG tablet Commonly known as: ACTOS Take 1 tablet (30 mg total) by mouth daily.          Objective:   Physical Exam BP (!) 146/82 (BP Location: Left Arm, Patient Position: Sitting, Cuff Size: Normal)   Pulse 64   Temp 98.6 F (37 C) (Temporal)   Resp 18   Ht 6\' 1"  (1.854 m)   Wt 214 lb 8 oz (97.3 kg)   SpO2 99%   BMI 28.30 kg/m  General:   Well developed, NAD, BMI noted. HEENT:  Normocephalic . Face symmetric,  atraumatic MSK: Left knee: Bony changes consistent with DJD, no effusion Right knee: Very subtle effusion noted, not red or warm.  Range of motion normal although he developed pain with forced flexion. Skin: Not pale. Not jaundice Neurologic:  alert & oriented X3.  Speech normal, gait appropriate for age and unassisted Psych--  Cognition and judgment appear intact.  Cooperative with normal attention span and concentration.  Behavior appropriate. No anxious or depressed appearing.      Assessment       Assessment DM 2009 HTN Hyperlipidemia CV: CAD 01/2017: had CP,  stress test show an area of fixed defect, saw cardiology, likely s/p a out-of-hospital MI at the time of CP. Rx Plavix /metoprolol  echo 02/2017 nl EF CVA 02/2018 per MRI, MRA neck head (-), echo ok; was on ASA, was rx plavix x 3 weeks  Renal: Dr Justin Mend ---Glomerulonephritis 2010, BX proven ---Secondary hyperparathyroidism HOH L GU H/o Hematuria,  urology w/u 09/2007 H/o Urinary retention 2010 BEE allergies, epipen Rx 09/2019  PLAN: Right knee pain: Started a week ago, he has a history of previous meniscal repair in 2015.  On exam he has a mild effusion. Plan: Refer to Ortho, ice, Tylenol, knee sleeve. High cholesterol: On Lipitor, last cholesterol not at goal, Zetia was added but cost was an issue.  Prescription resent to our pharmacy, hopefully cost would be acceptable. HTN: Ambulatory BPs never more than 142/80, typically in the 130s.  No change RTC already scheduled for few weeks.   This visit occurred during the SARS-CoV-2 public health emergency.  Safety protocols were in place, including screening questions prior to the visit, additional usage of staff PPE, and extensive cleaning of exam room while observing appropriate contact time as indicated for disinfecting solutions.

## 2019-11-28 NOTE — Patient Instructions (Addendum)
Per our records you are due for an eye exam. Please contact your eye doctor to schedule an appointment. Please have them send copies of your office visit notes to [place]. Our fax number is [phone].    We are referring you to a new orthopedic doctor at [facility] orthopedics  Until then continue taking Tylenol, ice the knee twice a day and use a knee sleeve  Please go to our pharmacy downstairs and get Zetia, the new medication for cholesterol

## 2019-11-28 NOTE — Progress Notes (Signed)
Pre visit review using our clinic review tool, if applicable. No additional management support is needed unless otherwise documented below in the visit note. 

## 2019-11-29 NOTE — Assessment & Plan Note (Signed)
Right knee pain: Started a week ago, he has a history of previous meniscal repair in 2015.  On exam he has a mild effusion. Plan: Refer to Ortho, ice, Tylenol, knee sleeve. High cholesterol: On Lipitor, last cholesterol not at goal, Zetia was added but cost was an issue.  Prescription resent to our pharmacy, hopefully cost would be acceptable. HTN: Ambulatory BPs never more than 142/80, typically in the 130s.  No change RTC already scheduled for few weeks.

## 2019-12-02 ENCOUNTER — Telehealth: Payer: Self-pay | Admitting: Cardiovascular Disease

## 2019-12-02 NOTE — Telephone Encounter (Signed)
Spoke with wife and informed per chart review, nurse didn't see any notation of a call. Nurse informed that possibly someone was calling to schedule f/u appointment as pt hasn't been seen in office since 05/22/17. Wife voiced she would like to schedule appointment. Appointment set for 7/23 at 9:40 am with Dr.C.

## 2019-12-02 NOTE — Telephone Encounter (Signed)
Follow Up:    Pt is calling back, said somebody had called him from here, he did not know who it was.

## 2019-12-06 ENCOUNTER — Encounter: Payer: Self-pay | Admitting: Internal Medicine

## 2019-12-07 ENCOUNTER — Telehealth: Payer: Self-pay

## 2019-12-07 NOTE — Telephone Encounter (Signed)
Pt dropped off document to be filled out by provider, (short term disability paperwork - 7 pages) Pt would like to be called at [phone] when document ready to pick up. Document put at front office tray under providers name.

## 2019-12-07 NOTE — Telephone Encounter (Signed)
See telephone note.

## 2019-12-07 NOTE — Telephone Encounter (Signed)
Pt dropped off ST disability form for [employer]. He requests that we call him when form is completed- he will pick up. Telephone: [phone] Form completed to best of my ability- several areas left blank for MD to complete. Form placed in PCP red folder.

## 2019-12-08 DIAGNOSIS — Z0279 Encounter for issue of other medical certificate: Secondary | ICD-10-CM

## 2019-12-08 NOTE — Telephone Encounter (Signed)
Form placed at front desk for pick up. Copy of form sent for scanning.

## 2019-12-08 NOTE — Telephone Encounter (Signed)
The patient is a Psychologist, occupational, frequently walk and kneel.  Due to knee pain he is unable to perform the job. Has been unable to work since approximately 11/21/2019, will give him short-term disability for a month. After that it will depend on his orthopedic doctor. Paperwork completed.

## 2019-12-09 ENCOUNTER — Encounter: Payer: Self-pay | Admitting: General Practice

## 2019-12-09 NOTE — Telephone Encounter (Signed)
Tried calling Pt- no answer, voicemail box not set up. Form updated and placed back at the front desk for pick up.

## 2019-12-09 NOTE — Telephone Encounter (Signed)
Pt came back stating document needed to have corrected the date of his last day at work (last day was 12-06-2019) Pt would like document corrected and call him when ready to pick up at [phone]. Document put at front office tray under providers name.

## 2019-12-15 DIAGNOSIS — M1711 Unilateral primary osteoarthritis, right knee: Secondary | ICD-10-CM | POA: Diagnosis not present

## 2019-12-17 DIAGNOSIS — Z7982 Long term (current) use of aspirin: Secondary | ICD-10-CM | POA: Diagnosis not present

## 2019-12-17 DIAGNOSIS — Z8249 Family history of ischemic heart disease and other diseases of the circulatory system: Secondary | ICD-10-CM | POA: Diagnosis not present

## 2019-12-17 DIAGNOSIS — Z008 Encounter for other general examination: Secondary | ICD-10-CM | POA: Diagnosis not present

## 2019-12-17 DIAGNOSIS — I10 Essential (primary) hypertension: Secondary | ICD-10-CM | POA: Diagnosis not present

## 2019-12-17 DIAGNOSIS — Z833 Family history of diabetes mellitus: Secondary | ICD-10-CM | POA: Diagnosis not present

## 2019-12-17 DIAGNOSIS — Z87891 Personal history of nicotine dependence: Secondary | ICD-10-CM | POA: Diagnosis not present

## 2019-12-17 DIAGNOSIS — E119 Type 2 diabetes mellitus without complications: Secondary | ICD-10-CM | POA: Diagnosis not present

## 2019-12-17 DIAGNOSIS — Z7984 Long term (current) use of oral hypoglycemic drugs: Secondary | ICD-10-CM | POA: Diagnosis not present

## 2019-12-17 DIAGNOSIS — M1990 Unspecified osteoarthritis, unspecified site: Secondary | ICD-10-CM | POA: Diagnosis not present

## 2019-12-17 DIAGNOSIS — E785 Hyperlipidemia, unspecified: Secondary | ICD-10-CM | POA: Diagnosis not present

## 2019-12-17 DIAGNOSIS — I25119 Atherosclerotic heart disease of native coronary artery with unspecified angina pectoris: Secondary | ICD-10-CM | POA: Diagnosis not present

## 2019-12-29 DIAGNOSIS — M1711 Unilateral primary osteoarthritis, right knee: Secondary | ICD-10-CM | POA: Diagnosis not present

## 2020-01-06 ENCOUNTER — Other Ambulatory Visit: Payer: Self-pay

## 2020-01-06 ENCOUNTER — Ambulatory Visit (INDEPENDENT_AMBULATORY_CARE_PROVIDER_SITE_OTHER): Payer: Medicare HMO | Admitting: Internal Medicine

## 2020-01-06 ENCOUNTER — Encounter: Payer: Self-pay | Admitting: Internal Medicine

## 2020-01-06 VITALS — BP 155/95 | HR 59 | Temp 97.9°F | Resp 18 | Ht 73.0 in | Wt 224.1 lb

## 2020-01-06 DIAGNOSIS — E782 Mixed hyperlipidemia: Secondary | ICD-10-CM

## 2020-01-06 DIAGNOSIS — G8929 Other chronic pain: Secondary | ICD-10-CM | POA: Diagnosis not present

## 2020-01-06 DIAGNOSIS — E118 Type 2 diabetes mellitus with unspecified complications: Secondary | ICD-10-CM | POA: Diagnosis not present

## 2020-01-06 DIAGNOSIS — M25561 Pain in right knee: Secondary | ICD-10-CM

## 2020-01-06 DIAGNOSIS — I10 Essential (primary) hypertension: Secondary | ICD-10-CM

## 2020-01-06 LAB — HEMOGLOBIN A1C: Hgb A1c MFr Bld: 6.9 % — ABNORMAL HIGH (ref 4.6–6.5)

## 2020-01-06 LAB — LIPID PANEL
Cholesterol: 114 mg/dL (ref 0–200)
HDL: 54.3 mg/dL (ref >39.00)
LDL Cholesterol: 45 mg/dL (ref 0–99)
NonHDL: 59.71
Total CHOL/HDL Ratio: 2
Triglycerides: 72 mg/dL (ref 0.0–149.0)
VLDL: 14.4 mg/dL (ref 0.0–40.0)

## 2020-01-06 NOTE — Patient Instructions (Addendum)
Per our records you are due for an eye exam. Please contact your eye doctor to schedule an appointment. Please have them send copies of your office visit notes to [place]. Our fax number is [phone].   Check the  blood pressure  daily, write down the all the numbers and send me a message in 2 weeks. BP GOAL is between 110/65 and  135/85.  GO TO THE LAB : Get the blood work     GO TO THE FRONT DESK, PLEASE SCHEDULE YOUR APPOINTMENTS Come back for a checkup in 4 months

## 2020-01-06 NOTE — Progress Notes (Signed)
Subjective:    Patient ID: Calvin Pretty Sr., male    DOB: 06/07/1956, 64 y.o.   MRN: [id number]  DOS:  01/06/2020 Type of visit - description: Routine visit Today we talk about high blood pressure, diabetes, DJD.  Continue with some knee pain, using the brace, not taking actually any pain medication. Denies chest pain or difficulty breathing No headache or dizziness Good med compliance. No recent ambulatory CBGs.    Review of Systems See above   Past Medical History:  Diagnosis Date  . Decreased hearing    Left  . Diabetes mellitus 2009   adult onset  . Glomerulonephritis 12/2008   bx provem membranous GN, sees renal q year  . Grave's disease    reportedly, history of i nthe past u/s 6/11 slightly enlarged gland w/o nodules  . H/O: hematuria    saw urology 3/09, neg w/u  . Hyperlipemia   . Hypertension   . Secondary hyperparathyroidism, renal (HCC)    Dr. [name]  . Stroke (HCC)   . Urinary retention 01/2009   was seen at the ER and followup by urology    Past Surgical History:  Procedure Laterality Date  . right knee surgery     around 2015    Allergies as of 01/06/2020      Reactions   Bee Venom Other (See Comments)   Unknown- Pt given Epi-pen   Penicillins    Unsure if this drug was the cause of rash      Medication List       Accurate as of January 06, 2020 11:59 PM. If you have any questions, ask your nurse or doctor.        aspirin 81 MG EC tablet Take 1 tablet (81 mg total) by mouth daily.   atorvastatin 80 MG tablet Commonly known as: LIPITOR Take 1 tablet (80 mg total) by mouth daily.   EPINEPHrine 0.3 mg/0.3 mL Soaj injection Commonly known as: EpiPen 2-Pak Inject 0.3 mLs (0.3 mg total) into the muscle as needed for anaphylaxis.   ezetimibe 10 MG tablet Commonly known as: Zetia Take 1 tablet (10 mg total) by mouth daily.   freestyle lancets Check blood sugar once daily   glucose blood test strip Commonly known as: FreeStyle InsuLinx  Test Test blood sugar once daily. Dx code: E11.9   losartan 100 MG tablet Commonly known as: COZAAR Take 1 tablet (100 mg total) by mouth daily.   metFORMIN 850 MG tablet Commonly known as: GLUCOPHAGE Take 1 tablet (850 mg total) by mouth 2 (two) times daily with a meal.   metoprolol tartrate 25 MG tablet Commonly known as: LOPRESSOR Take 1 tablet (25 mg total) by mouth 2 (two) times daily.   nitroGLYCERIN 0.4 MG SL tablet Commonly known as: NITROSTAT Place 1 tablet (0.4 mg total) under the tongue every 5 (five) minutes as needed for chest pain.   pioglitazone 30 MG tablet Commonly known as: ACTOS Take 1 tablet (30 mg total) by mouth daily.          Objective:   Physical Exam BP (!) 155/95 (BP Location: Left Arm)   Pulse (!) 59   Temp 97.9 F (36.6 C) (Temporal)   Resp 18   Ht 6\' 1"  (1.854 m)   Wt 224 lb 2 oz (101.7 kg)   SpO2 97%   BMI 29.57 kg/m  General:   Well developed, NAD, BMI noted. HEENT:  Normocephalic . Face symmetric, atraumatic Lungs:  CTA B Normal  respiratory effort, no intercostal retractions, no accessory muscle use. Heart: RRR,  no murmur.  Lower extremities: no pretibial edema bilaterally.  Has a brace at the right knee. Skin: Not pale. Not jaundice Neurologic:  alert & oriented X3.  Speech normal, gait appropriate for age and unassisted Psych--  Cognition and judgment appear intact.  Cooperative with normal attention span and concentration.  Behavior appropriate. No anxious or depressed appearing.      Assessment       Assessment DM 2009 HTN Hyperlipidemia CV: CAD 01/2017: had CP,  stress test show an area of fixed defect, saw cardiology, likely s/p a out-of-hospital MI at the time of CP. Rx Plavix /metoprolol  echo 02/2017 nl EF CVA 02/2018 per MRI, MRA neck head (-), echo ok; was on ASA, was rx plavix x 3 weeks  Renal: Dr [name] ---Glomerulonephritis 2010, BX proven ---Secondary hyperparathyroidism HOH L GU H/o Hematuria,  urology w/u 09/2007 H/o Urinary retention 2010 BEE allergies, epipen Rx 09/2019  PLAN: DM: On Metformin, Actos.  Check A1c  HTN: BP today is elevated, asx, typically well controlled, good losartan compliance.  BP last month 136/78, last week systolic BP 142.  BP repeated by me manually, left arm: 155/95. Plan: Check BPs daily, call with readings in 2 week, consider adjust medicines High cholesterol: Currently on Lipitor and Zetia, check FLP DJD: Saw the orthopedic doctor for R knee pain, x-rays were done, had a local injection, eventually he was recommended a TKR at some point. RTC 4 months   This visit occurred during the SARS-CoV-2 public health emergency.  Safety protocols were in place, including screening questions prior to the visit, additional usage of staff PPE, and extensive cleaning of exam room while observing appropriate contact time as indicated for disinfecting solutions.

## 2020-01-06 NOTE — Progress Notes (Signed)
Pre visit review using our clinic review tool, if applicable. No additional management support is needed unless otherwise documented below in the visit note. 

## 2020-01-08 NOTE — Assessment & Plan Note (Signed)
DM: On Metformin, Actos.  Check A1c  HTN: BP today is elevated, asx, typically well controlled, good losartan compliance.  BP last month 136/78, last week systolic BP 142.  BP repeated by me manually, left arm: 155/95. Plan: Check BPs daily, call with readings in 2 week, consider adjust medicines High cholesterol: Currently on Lipitor and Zetia, check FLP DJD: Saw the orthopedic doctor for R knee pain, x-rays were done, had a local injection, eventually he was recommended a TKR at some point. RTC 4 months

## 2020-01-17 ENCOUNTER — Encounter: Payer: Self-pay | Admitting: Internal Medicine

## 2020-01-27 ENCOUNTER — Encounter: Payer: Self-pay | Admitting: Cardiovascular Disease

## 2020-01-27 ENCOUNTER — Ambulatory Visit (INDEPENDENT_AMBULATORY_CARE_PROVIDER_SITE_OTHER): Payer: Medicare HMO | Admitting: Cardiovascular Disease

## 2020-01-27 ENCOUNTER — Other Ambulatory Visit: Payer: Self-pay

## 2020-01-27 VITALS — BP 136/75 | HR 58 | Ht 73.0 in | Wt 226.4 lb

## 2020-01-27 DIAGNOSIS — I10 Essential (primary) hypertension: Secondary | ICD-10-CM

## 2020-01-27 DIAGNOSIS — E118 Type 2 diabetes mellitus with unspecified complications: Secondary | ICD-10-CM | POA: Diagnosis not present

## 2020-01-27 DIAGNOSIS — E7800 Pure hypercholesterolemia, unspecified: Secondary | ICD-10-CM

## 2020-01-27 DIAGNOSIS — Z01810 Encounter for preprocedural cardiovascular examination: Secondary | ICD-10-CM

## 2020-01-27 DIAGNOSIS — E663 Overweight: Secondary | ICD-10-CM

## 2020-01-27 DIAGNOSIS — I2510 Atherosclerotic heart disease of native coronary artery without angina pectoris: Secondary | ICD-10-CM

## 2020-01-27 NOTE — Patient Instructions (Signed)
Medication Instructions:  No changes *If you need a refill on your cardiac medications before your next appointment, please call your pharmacy*   Lab Work: None ordered If you have labs (blood work) drawn today and your tests are completely normal, you will receive your results only by: MyChart Message (if you have MyChart) OR A paper copy in the mail If you have any lab test that is abnormal or we need to change your treatment, we will call you to review the results.   Testing/Procedures: None ordered   Follow-Up: At [facility], you and your health needs are our priority.  As part of our continuing mission to provide you with exceptional heart care, we have created designated Provider Care Teams.  These Care Teams include your primary Cardiologist (physician) and Advanced Practice Providers (APPs -  Physician Assistants and Nurse Practitioners) who all work together to provide you with the care you need, when you need it.  We recommend signing up for the patient portal called "MyChart".  Sign up information is provided on this After Visit Summary.  MyChart is used to connect with patients for Virtual Visits (Telemedicine).  Patients are able to view lab/test results, encounter notes, upcoming appointments, etc.  Non-urgent messages can be sent to your provider as well.   To learn more about what you can do with MyChart, go to [email or web address].    Your next appointment:   12 month(s)  The format for your next appointment:   In Person  Provider:   Dr. Croitoru   

## 2020-01-29 ENCOUNTER — Encounter: Payer: Self-pay | Admitting: Cardiovascular Disease

## 2020-01-29 NOTE — Progress Notes (Signed)
Cardiology Office Note:    Date:  01/29/2020   ID:  Calvin Williams Sr., DOB 12-Oct-1955, MRN [id number]  PCP:  [name], MD  Cardiologist:  [name], MD    Referring MD: [name], MD   Chief Complaint  Patient presents with  . Coronary Artery Disease    History of Present Illness:    Calvin Dishman Sr. is a 64 y.o. male with a hx of diabetes mellitus, hyperlipidemia, hypertension, overweight, with an episode of chest discomfort in 2018, that likely represented a myocardial infarction.  He only sought medical care weeks later.  His nuclear stress test showed a fixed defect consistent with a possible infarction in the distribution of a branch of the left circumflex coronary artery.  There was no meaningful reversible ischemia and left ventricular systolic function was normal.   He has not had any chest discomfort or other cardiac complaints since that time.  Walking is limited by pain in his right knee, but he exercises regularly on a stationary bike without a problem.  He denies shortness of breath at rest or with activity and has not had palpitations, dizziness, syncope, edema, intermittent claudication, erectile dysfunction or focal neurological complaints.  His most recent hemoglobin A1c was acceptable at 6.9% (Metformin plus pioglitazone).  His most recent LDL cholesterol was 45 (atorvastatin plus ezetimibe).  He takes a daily 81 mg aspirin.  He is planning to undergo total knee replacement with Dr. [name] from [name], sometime in the fall.  Past Medical History:  Diagnosis Date  . Decreased hearing    Left  . Diabetes mellitus 2009   adult onset  . Glomerulonephritis 12/2008   bx provem membranous GN, sees renal q year  . Grave's disease    reportedly, history of i nthe past u/s 6/11 slightly enlarged gland w/o nodules  . H/O: hematuria    saw urology 3/09, neg w/u  . Hyperlipemia   . Hypertension   . Secondary hyperparathyroidism, renal (HCC)     Dr. [name]  . Stroke (HCC)   . Urinary retention 01/2009   was seen at the ER and followup by urology    Past Surgical History:  Procedure Laterality Date  . right knee surgery     around 2015    Current Medications: Current Meds  Medication Sig  . aspirin EC 81 MG EC tablet Take 1 tablet (81 mg total) by mouth daily.  Marland Kitchen atorvastatin (LIPITOR) 80 MG tablet Take 1 tablet (80 mg total) by mouth daily.  Marland Kitchen EPINEPHrine (EPIPEN 2-PAK) 0.3 mg/0.3 mL IJ SOAJ injection Inject 0.3 mLs (0.3 mg total) into the muscle as needed for anaphylaxis.  Marland Kitchen ezetimibe (ZETIA) 10 MG tablet Take 1 tablet (10 mg total) by mouth daily.  Marland Kitchen glucose blood (FREESTYLE INSULINX TEST) test strip Test blood sugar once daily. Dx code: E11.9  . Lancets (FREESTYLE) lancets Check blood sugar once daily  . losartan (COZAAR) 100 MG tablet Take 1 tablet (100 mg total) by mouth daily.  . metFORMIN (GLUCOPHAGE) 850 MG tablet Take 1 tablet (850 mg total) by mouth 2 (two) times daily with a meal.  . metoprolol tartrate (LOPRESSOR) 25 MG tablet Take 1 tablet (25 mg total) by mouth 2 (two) times daily.  . nitroGLYCERIN (NITROSTAT) 0.4 MG SL tablet Place 1 tablet (0.4 mg total) under the tongue every 5 (five) minutes as needed for chest pain.  . pioglitazone (ACTOS) 30 MG tablet Take 1 tablet (30 mg total) by  mouth daily.     Allergies:   Bee venom and Penicillins   Social History   Socioeconomic History  . Marital status: Married    Spouse name: Not on file  . Number of children: 3  . Years of education: Not on file  . Highest education level: Not on file  Occupational History  . Occupation: welder  Tobacco Use  . Smoking status: Former Smoker    Packs/day: 1.00    Quit date: 04/17/1989    Years since quitting: 30.8  . Smokeless tobacco: Never Used  Substance and Sexual Activity  . Alcohol use: No  . Drug use: No  . Sexual activity: Not on file  Other Topics Concern  . Not on file  Social History Narrative    Original from [place], moved to [place] in the 80s , lives w/ wife   Social Determinants of Health   Financial Resource Strain:   . Difficulty of Paying Living Expenses:   Food Insecurity:   . Worried About Programme researcher, broadcasting/film/video in the Last Year:   . Barista in the Last Year:   Transportation Needs:   . Freight forwarder (Medical):   Marland Kitchen Lack of Transportation (Non-Medical):   Physical Activity:   . Days of Exercise per Week:   . Minutes of Exercise per Session:   Stress:   . Feeling of Stress :   Social Connections:   . Frequency of Communication with Friends and Family:   . Frequency of Social Gatherings with Friends and Family:   . Attends Religious Services:   . Active Member of Clubs or Organizations:   . Attends Banker Meetings:   Marland Kitchen Marital Status:      Family History: The patient's family history includes Diabetes in his maternal grandmother and another family member; Healthy in his mother; Hypertension in his maternal grandmother. There is no history of Heart attack, Colon cancer, or Prostate cancer. ROS:   Please see the history of present illness.    All other systems are reviewed and are negative.   EKGs/Labs/Other Studies Reviewed:    The following studies were reviewed today: Nuclear stress test images, echocardiogram  EKG:  EKG is ordered today and shows normal sinus rhythm, incomplete right bundle branch block (QRS 102 ms), no ischemic repolarization normalities, QTc 429 ms  Recent Labs: 09/30/2019: ALT 24; BUN 19; Creatinine, Ser 1.16; Hemoglobin 15.1; Platelets 165.0; Potassium 3.9; Sodium 138  Recent Lipid Panel    Component Value Date/Time   CHOL 114 01/06/2020 0919   TRIG 72.0 01/06/2020 0919   HDL 54.30 01/06/2020 0919   CHOLHDL 2 01/06/2020 0919   VLDL 14.4 01/06/2020 0919   LDLCALC 45 01/06/2020 0919   LDLDIRECT 118.7 12/27/2010 0816    Physical Exam:    VS:  BP (!) 136/75   Pulse 58   Ht 6\' 1"  (1.854 m)   Wt (!) 226 lb  6.4 oz (102.7 kg)   SpO2 100%   BMI 29.87 kg/m     Wt Readings from Last 3 Encounters:  01/27/20 (!) 226 lb 6.4 oz (102.7 kg)  01/06/20 224 lb 2 oz (101.7 kg)  11/28/19 214 lb 8 oz (97.3 kg)     General: Alert, oriented x3, no distress, borderline obese Head: no evidence of trauma, PERRL, EOMI, no exophtalmos or lid lag, no myxedema, no xanthelasma; normal ears, nose and oropharynx Neck: normal jugular venous pulsations and no hepatojugular reflux; brisk carotid pulses without  delay and no carotid bruits Chest: clear to auscultation, no signs of consolidation by percussion or palpation, normal fremitus, symmetrical and full respiratory excursions Cardiovascular: normal position and quality of the apical impulse, regular rhythm, normal first and second heart sounds, no murmurs, rubs or gallops Abdomen: no tenderness or distention, no masses by palpation, no abnormal pulsatility or arterial bruits, normal bowel sounds, no hepatosplenomegaly Extremities: no clubbing, cyanosis or edema; 2+ radial, ulnar and brachial pulses bilaterally; 2+ right femoral, posterior tibial and dorsalis pedis pulses; 2+ left femoral, posterior tibial and dorsalis pedis pulses; no subclavian or femoral bruits Neurological: grossly nonfocal Psych: Normal mood and affect   ASSESSMENT:    1. Coronary artery disease involving native coronary artery of native heart without angina pectoris   2. Essential hypertension   3. Pure hypercholesterolemia   4. Controlled type 2 diabetes mellitus with complication, without long-term current use of insulin (HCC)   5. Overweight (BMI 25.0-29.9)   6. Preoperative cardiovascular examination    PLAN:    In order of problems listed above:  1. CAD: Does not have angina pectoris, asymptomatic.  The evidence overwhelmingly suggests that he had a small myocardial infarction in the basal lateral wall that is complete.    Left ventricular systolic function is preserved.  Continue  conservative management.  He is on aspirin, statin, beta-blocker, angiotensin receptor blocker. 2. HTN: Blood pressure control is acceptable 3. HLP: Excellent LDL cholesterol, well within target range of less than 70. 4. DM: Good glycemic control.  Might consider switching from pioglitazone to one of the newer agents with positive cardiovascular outcomes (SGLT2 inhibitor such as Jardiance or Farxiga or GLP-1 agonist such as Ozempic or Trulicity). 5. Overweight: He is actually gained back some weight during the coronavirus pandemic, like many of our patients.  Continue efforts to try to slim down to a BMI of 25 and a waistline of 34 inches. 6. Preop CV risk: I believe heart care is at low risk for major cardiovascular complications with planned knee surgery.  It is very important that his beta-blocker (metoprolol 25 mg twice daily) is not discontinued around the time of surgery.  If desired, aspirin can be temporarily interrupted for the surgery.   Medication Adjustments/Labs and Tests Ordered: Current medicines are reviewed at length with the patient today.  Concerns regarding medicines are outlined above.  Orders Placed This Encounter  Procedures  . EKG 12-Lead   No orders of the defined types were placed in this encounter.   Signed, [name], MD  01/29/2020 2:32 PM    [facility]

## 2020-02-07 ENCOUNTER — Other Ambulatory Visit: Payer: Self-pay | Admitting: Internal Medicine

## 2020-02-07 ENCOUNTER — Telehealth: Payer: Self-pay | Admitting: Internal Medicine

## 2020-02-07 NOTE — Telephone Encounter (Signed)
BP readings placed in PCP red folder for review.

## 2020-02-07 NOTE — Telephone Encounter (Signed)
Pt dropped off notes of his BP (letter in an white envelope with all his BP reading per days) Document put at front office tray under providers name.

## 2020-02-08 NOTE — Telephone Encounter (Signed)
Ambulatory BPs are very good: All of them except one reading are below 135/85. Spoke with patient, he is aware, continue same medications.

## 2020-02-10 ENCOUNTER — Other Ambulatory Visit: Payer: Self-pay | Admitting: Internal Medicine

## 2020-02-10 DIAGNOSIS — E118 Type 2 diabetes mellitus with unspecified complications: Secondary | ICD-10-CM

## 2020-02-22 ENCOUNTER — Other Ambulatory Visit: Payer: Self-pay | Admitting: Internal Medicine

## 2020-03-08 ENCOUNTER — Telehealth: Payer: Self-pay

## 2020-03-08 NOTE — Telephone Encounter (Signed)
° °  [facility] Pre-operative Risk Assessment    HEARTCARE STAFF: - Please ensure there is not already an duplicate clearance open for this procedure. - Under Visit Info/Reason for Call, type in Other and utilize the format Clearance MM/DD/YY or Clearance TBD. Do not use dashes or single digits. - If request is for dental extraction, please clarify the # of teeth to be extracted.  Request for surgical clearance:  1. What type of surgery is being performed? Right Knee Arthroplasty   2. When is this surgery scheduled? 04/06/2020  3. What type of clearance is required (medical clearance vs. Pharmacy clearance to hold med vs. Both)? Both  4. Are there any medications that need to be held prior to surgery and how long? None listed  5. Practice name and name of physician performing surgery? [facility]- Frederik Pear, MD  6. What is the office phone number? [phone]   7.   What is the office fax number? [phone]  8.   Anesthesia type (None, local, MAC, general) ? Spinal Anesthesia   Calvin Williams 03/08/2020, 4:46 PM  _________________________________________________________________   (provider comments below)

## 2020-03-09 ENCOUNTER — Telehealth: Payer: Self-pay

## 2020-03-09 DIAGNOSIS — Z01818 Encounter for other preprocedural examination: Secondary | ICD-10-CM

## 2020-03-09 NOTE — Telephone Encounter (Signed)
Received surgical clearance from [facility].Pt needing R knee arthroscopy under spinal anesthesia on 04/06/2020 w/ Dr. [name]. Last OV 01/06/2020, last EKG at cardiology on 01/27/2020. Please advise if appt is needed.

## 2020-03-09 NOTE — Telephone Encounter (Signed)
   Primary Cardiologist: [name], MD  Chart reviewed as part of pre-operative protocol coverage.   The patient has a hx of ischemic heart disease with infarct noted on prior nuclear stress test.  He was last see by Dr. [name] 01/27/20.  At that time, he was doing well without angina.  According to Dr. [name], from that visit: "Preop CV risk: I believe heart care is at low risk for major cardiovascular complications with planned knee surgery.  It is very important that his beta-blocker (metoprolol 25 mg twice daily) is not discontinued around the time of surgery.  If desired, aspirin can be temporarily interrupted for the surgery."  Recommendations: Therefore, given his past medical history and time since last visit, based on ACC/AHA guidelines, Calvin Fearnow Sr. would be at acceptable risk for the planned procedure without further cardiovascular testing.   The patient was contacted today and was advised that if he develops new symptoms prior to surgery to contact our office to arrange for a follow-up visit, and he verbalized understanding.   Tereso Newcomer, PA-C 03/09/2020, 10:25 AM

## 2020-03-09 NOTE — Telephone Encounter (Signed)
Note faxed to surgeon's office. Note will be removed from [facility], [place]  11:00 AM 03/09/2020

## 2020-03-12 NOTE — Telephone Encounter (Signed)
Patient has been cleared by cardiology already, also cleared from my standpoint.

## 2020-03-13 NOTE — Telephone Encounter (Signed)
Lab appt scheduled 03/21/20.

## 2020-03-13 NOTE — Telephone Encounter (Signed)
Dr. Turner Daniels requesting pre-op labs here. Orders placed. Tried calling Pt to schedule lab, no answer, unable to leave message.

## 2020-03-13 NOTE — Telephone Encounter (Signed)
Clearance form faxed to [facility] ATTN: Sharyn Blitz w/ note that labs will be completed on 03/21/20. Form sent for scanning.

## 2020-03-16 ENCOUNTER — Other Ambulatory Visit: Payer: Self-pay

## 2020-03-16 ENCOUNTER — Other Ambulatory Visit: Payer: Medicare HMO

## 2020-03-16 ENCOUNTER — Ambulatory Visit: Payer: Medicare HMO | Admitting: Internal Medicine

## 2020-03-16 DIAGNOSIS — Z01818 Encounter for other preprocedural examination: Secondary | ICD-10-CM | POA: Diagnosis not present

## 2020-03-16 DIAGNOSIS — E119 Type 2 diabetes mellitus without complications: Secondary | ICD-10-CM | POA: Diagnosis not present

## 2020-03-17 LAB — COMPREHENSIVE METABOLIC PANEL
AG Ratio: 1.6 (calc) (ref 1.0–2.5)
ALT: 19 U/L (ref 9–46)
AST: 22 U/L (ref 10–35)
Albumin: 4.1 g/dL (ref 3.6–5.1)
Alkaline phosphatase (APISO): 80 U/L (ref 35–144)
BUN: 20 mg/dL (ref 7–25)
CO2: 28 mmol/L (ref 20–32)
Calcium: 9.3 mg/dL (ref 8.6–10.3)
Chloride: 104 mmol/L (ref 98–110)
Creat: 1.1 mg/dL (ref 0.70–1.25)
Globulin: 2.5 g/dL (calc) (ref 1.9–3.7)
Glucose, Bld: 88 mg/dL (ref 65–99)
Potassium: 4.5 mmol/L (ref 3.5–5.3)
Sodium: 141 mmol/L (ref 135–146)
Total Bilirubin: 1.3 mg/dL — ABNORMAL HIGH (ref 0.2–1.2)
Total Protein: 6.6 g/dL (ref 6.1–8.1)

## 2020-03-17 LAB — CBC WITH DIFFERENTIAL/PLATELET
Absolute Monocytes: 546 cells/uL (ref 200–950)
Basophils Absolute: 42 cells/uL (ref 0–200)
Basophils Relative: 0.7 %
Eosinophils Absolute: 60 cells/uL (ref 15–500)
Eosinophils Relative: 1 %
HCT: 41.9 % (ref 38.5–50.0)
Hemoglobin: 14.4 g/dL (ref 13.2–17.1)
Lymphs Abs: 1260 cells/uL (ref 850–3900)
MCH: 30.4 pg (ref 27.0–33.0)
MCHC: 34.4 g/dL (ref 32.0–36.0)
MCV: 88.4 fL (ref 80.0–100.0)
MPV: 9.7 fL (ref 7.5–12.5)
Monocytes Relative: 9.1 %
Neutro Abs: 4092 cells/uL (ref 1500–7800)
Neutrophils Relative %: 68.2 %
Platelets: 144 10*3/uL (ref 140–400)
RBC: 4.74 10*6/uL (ref 4.20–5.80)
RDW: 12.9 % (ref 11.0–15.0)
Total Lymphocyte: 21 %
WBC: 6 10*3/uL (ref 3.8–10.8)

## 2020-03-17 LAB — HEMOGLOBIN A1C
Hgb A1c MFr Bld: 6.6 % of total Hgb — ABNORMAL HIGH (ref <5.7)
Mean Plasma Glucose: 143 (calc)
eAG (mmol/L): 7.9 (calc)

## 2020-03-17 LAB — PROTIME-INR
INR: 1
Prothrombin Time: 10.9 s (ref 9.0–11.5)

## 2020-03-19 NOTE — Telephone Encounter (Signed)
Pre-op labs from 03/16/20 faxed to [facility]- Dr. Turner Daniels.

## 2020-03-21 ENCOUNTER — Ambulatory Visit: Payer: Medicare HMO | Admitting: Internal Medicine

## 2020-03-23 ENCOUNTER — Other Ambulatory Visit: Payer: Self-pay | Admitting: Internal Medicine

## 2020-03-26 ENCOUNTER — Telehealth: Payer: Self-pay | Admitting: Internal Medicine

## 2020-03-26 NOTE — Telephone Encounter (Signed)
Labs refaxed 

## 2020-03-26 NOTE — Telephone Encounter (Signed)
lab results?

## 2020-03-26 NOTE — Telephone Encounter (Signed)
Calvin Williams   Requesting lab order sent to their office from last visit on 03/21/2020.   Fax number # [phone]

## 2020-03-27 DIAGNOSIS — M1711 Unilateral primary osteoarthritis, right knee: Secondary | ICD-10-CM | POA: Diagnosis not present

## 2020-04-06 DIAGNOSIS — M1711 Unilateral primary osteoarthritis, right knee: Secondary | ICD-10-CM | POA: Diagnosis not present

## 2020-04-06 DIAGNOSIS — Z96651 Presence of right artificial knee joint: Secondary | ICD-10-CM | POA: Diagnosis not present

## 2020-04-06 DIAGNOSIS — G8918 Other acute postprocedural pain: Secondary | ICD-10-CM | POA: Diagnosis not present

## 2020-04-07 DIAGNOSIS — Z96651 Presence of right artificial knee joint: Secondary | ICD-10-CM | POA: Diagnosis not present

## 2020-04-07 DIAGNOSIS — E119 Type 2 diabetes mellitus without complications: Secondary | ICD-10-CM | POA: Diagnosis not present

## 2020-04-07 DIAGNOSIS — Z7984 Long term (current) use of oral hypoglycemic drugs: Secondary | ICD-10-CM | POA: Diagnosis not present

## 2020-04-07 DIAGNOSIS — Z471 Aftercare following joint replacement surgery: Secondary | ICD-10-CM | POA: Diagnosis not present

## 2020-04-09 DIAGNOSIS — Z96651 Presence of right artificial knee joint: Secondary | ICD-10-CM | POA: Diagnosis not present

## 2020-04-09 DIAGNOSIS — Z471 Aftercare following joint replacement surgery: Secondary | ICD-10-CM | POA: Diagnosis not present

## 2020-04-09 DIAGNOSIS — Z7984 Long term (current) use of oral hypoglycemic drugs: Secondary | ICD-10-CM | POA: Diagnosis not present

## 2020-04-09 DIAGNOSIS — E119 Type 2 diabetes mellitus without complications: Secondary | ICD-10-CM | POA: Diagnosis not present

## 2020-04-11 DIAGNOSIS — Z471 Aftercare following joint replacement surgery: Secondary | ICD-10-CM | POA: Diagnosis not present

## 2020-04-11 DIAGNOSIS — E119 Type 2 diabetes mellitus without complications: Secondary | ICD-10-CM | POA: Diagnosis not present

## 2020-04-11 DIAGNOSIS — Z96651 Presence of right artificial knee joint: Secondary | ICD-10-CM | POA: Diagnosis not present

## 2020-04-11 DIAGNOSIS — Z7984 Long term (current) use of oral hypoglycemic drugs: Secondary | ICD-10-CM | POA: Diagnosis not present

## 2020-04-13 DIAGNOSIS — Z7984 Long term (current) use of oral hypoglycemic drugs: Secondary | ICD-10-CM | POA: Diagnosis not present

## 2020-04-13 DIAGNOSIS — Z471 Aftercare following joint replacement surgery: Secondary | ICD-10-CM | POA: Diagnosis not present

## 2020-04-13 DIAGNOSIS — E119 Type 2 diabetes mellitus without complications: Secondary | ICD-10-CM | POA: Diagnosis not present

## 2020-04-13 DIAGNOSIS — Z96651 Presence of right artificial knee joint: Secondary | ICD-10-CM | POA: Diagnosis not present

## 2020-04-16 DIAGNOSIS — Z471 Aftercare following joint replacement surgery: Secondary | ICD-10-CM | POA: Diagnosis not present

## 2020-04-16 DIAGNOSIS — Z96651 Presence of right artificial knee joint: Secondary | ICD-10-CM | POA: Diagnosis not present

## 2020-04-16 DIAGNOSIS — E119 Type 2 diabetes mellitus without complications: Secondary | ICD-10-CM | POA: Diagnosis not present

## 2020-04-16 DIAGNOSIS — Z7984 Long term (current) use of oral hypoglycemic drugs: Secondary | ICD-10-CM | POA: Diagnosis not present

## 2020-04-17 DIAGNOSIS — Z96651 Presence of right artificial knee joint: Secondary | ICD-10-CM | POA: Diagnosis not present

## 2020-04-17 DIAGNOSIS — Z9889 Other specified postprocedural states: Secondary | ICD-10-CM | POA: Diagnosis not present

## 2020-04-17 DIAGNOSIS — M6281 Muscle weakness (generalized): Secondary | ICD-10-CM | POA: Diagnosis not present

## 2020-04-17 DIAGNOSIS — M25661 Stiffness of right knee, not elsewhere classified: Secondary | ICD-10-CM | POA: Diagnosis not present

## 2020-04-18 DIAGNOSIS — M6281 Muscle weakness (generalized): Secondary | ICD-10-CM | POA: Diagnosis not present

## 2020-04-18 DIAGNOSIS — Z96651 Presence of right artificial knee joint: Secondary | ICD-10-CM | POA: Diagnosis not present

## 2020-04-18 DIAGNOSIS — M25661 Stiffness of right knee, not elsewhere classified: Secondary | ICD-10-CM | POA: Diagnosis not present

## 2020-04-23 DIAGNOSIS — Z96651 Presence of right artificial knee joint: Secondary | ICD-10-CM | POA: Diagnosis not present

## 2020-04-23 DIAGNOSIS — M6281 Muscle weakness (generalized): Secondary | ICD-10-CM | POA: Diagnosis not present

## 2020-04-23 DIAGNOSIS — M25661 Stiffness of right knee, not elsewhere classified: Secondary | ICD-10-CM | POA: Diagnosis not present

## 2020-04-25 DIAGNOSIS — Z96651 Presence of right artificial knee joint: Secondary | ICD-10-CM | POA: Diagnosis not present

## 2020-04-25 DIAGNOSIS — M6281 Muscle weakness (generalized): Secondary | ICD-10-CM | POA: Diagnosis not present

## 2020-04-25 DIAGNOSIS — M25661 Stiffness of right knee, not elsewhere classified: Secondary | ICD-10-CM | POA: Diagnosis not present

## 2020-04-27 DIAGNOSIS — M6281 Muscle weakness (generalized): Secondary | ICD-10-CM | POA: Diagnosis not present

## 2020-04-27 DIAGNOSIS — M25661 Stiffness of right knee, not elsewhere classified: Secondary | ICD-10-CM | POA: Diagnosis not present

## 2020-04-27 DIAGNOSIS — Z96651 Presence of right artificial knee joint: Secondary | ICD-10-CM | POA: Diagnosis not present

## 2020-04-30 DIAGNOSIS — M25661 Stiffness of right knee, not elsewhere classified: Secondary | ICD-10-CM | POA: Diagnosis not present

## 2020-04-30 DIAGNOSIS — Z96651 Presence of right artificial knee joint: Secondary | ICD-10-CM | POA: Diagnosis not present

## 2020-04-30 DIAGNOSIS — M6281 Muscle weakness (generalized): Secondary | ICD-10-CM | POA: Diagnosis not present

## 2020-05-02 DIAGNOSIS — M6281 Muscle weakness (generalized): Secondary | ICD-10-CM | POA: Diagnosis not present

## 2020-05-02 DIAGNOSIS — M25661 Stiffness of right knee, not elsewhere classified: Secondary | ICD-10-CM | POA: Diagnosis not present

## 2020-05-02 DIAGNOSIS — Z96651 Presence of right artificial knee joint: Secondary | ICD-10-CM | POA: Diagnosis not present

## 2020-05-04 DIAGNOSIS — M6281 Muscle weakness (generalized): Secondary | ICD-10-CM | POA: Diagnosis not present

## 2020-05-04 DIAGNOSIS — M25661 Stiffness of right knee, not elsewhere classified: Secondary | ICD-10-CM | POA: Diagnosis not present

## 2020-05-04 DIAGNOSIS — Z96651 Presence of right artificial knee joint: Secondary | ICD-10-CM | POA: Diagnosis not present

## 2020-05-07 DIAGNOSIS — Z96651 Presence of right artificial knee joint: Secondary | ICD-10-CM | POA: Diagnosis not present

## 2020-05-07 DIAGNOSIS — M6281 Muscle weakness (generalized): Secondary | ICD-10-CM | POA: Diagnosis not present

## 2020-05-07 DIAGNOSIS — M25661 Stiffness of right knee, not elsewhere classified: Secondary | ICD-10-CM | POA: Diagnosis not present

## 2020-05-11 ENCOUNTER — Encounter: Payer: Self-pay | Admitting: Internal Medicine

## 2020-05-11 ENCOUNTER — Other Ambulatory Visit: Payer: Self-pay

## 2020-05-11 ENCOUNTER — Ambulatory Visit (INDEPENDENT_AMBULATORY_CARE_PROVIDER_SITE_OTHER): Payer: Medicare HMO | Admitting: Internal Medicine

## 2020-05-11 VITALS — BP 163/97 | HR 58 | Temp 98.8°F | Resp 16 | Ht 73.0 in | Wt 220.1 lb

## 2020-05-11 DIAGNOSIS — Z23 Encounter for immunization: Secondary | ICD-10-CM

## 2020-05-11 DIAGNOSIS — E118 Type 2 diabetes mellitus with unspecified complications: Secondary | ICD-10-CM | POA: Diagnosis not present

## 2020-05-11 DIAGNOSIS — I10 Essential (primary) hypertension: Secondary | ICD-10-CM | POA: Diagnosis not present

## 2020-05-11 LAB — BASIC METABOLIC PANEL
BUN: 24 mg/dL (ref 7–25)
CO2: 30 mmol/L (ref 20–32)
Calcium: 9.6 mg/dL (ref 8.6–10.3)
Chloride: 105 mmol/L (ref 98–110)
Creat: 1.05 mg/dL (ref 0.70–1.25)
Glucose, Bld: 143 mg/dL — ABNORMAL HIGH (ref 65–99)
Potassium: 4.1 mmol/L (ref 3.5–5.3)
Sodium: 141 mmol/L (ref 135–146)

## 2020-05-11 NOTE — Progress Notes (Signed)
Subjective:    Patient ID: Calvin Gains Sr., male    DOB: [birth year], 64 y.o.   MRN: [id number]  DOS:  05/11/2020 Type of visit - description: Follow-up Doing well after knee replacement April 06, 2020. We review his ambulatory BPs and CBGs. Pain from surgery is controlled on Tylenol He denies fever chills No chest pain no difficulty breathing.    Review of Systems See above   Past Medical History:  Diagnosis Date  . Decreased hearing    Left  . Diabetes mellitus 2009   adult onset  . Glomerulonephritis 12/2008   bx provem membranous GN, sees renal q year  . Grave's disease    reportedly, history of i nthe past u/s 6/11 slightly enlarged gland w/o nodules  . H/O: hematuria    saw urology 3/09, neg w/u  . Hyperlipemia   . Hypertension   . Secondary hyperparathyroidism, renal (HCC)    Dr. [name]  . Stroke (HCC)   . Urinary retention 01/2009   was seen at the ER and followup by urology    Past Surgical History:  Procedure Laterality Date  . right knee surgery     around 2015, 2021    Allergies as of 05/11/2020      Reactions   Bee Venom Other (See Comments)   Unknown- Pt given Epi-pen   Penicillins    Unsure if this drug was the cause of rash      Medication List       Accurate as of May 11, 2020 11:59 PM. If you have any questions, ask your nurse or doctor.        aspirin 81 MG EC tablet Take 1 tablet (81 mg total) by mouth daily.   atorvastatin 80 MG tablet Commonly known as: LIPITOR Take 1 tablet (80 mg total) by mouth daily.   EPINEPHrine 0.3 mg/0.3 mL Soaj injection Commonly known as: EpiPen 2-Pak Inject 0.3 mLs (0.3 mg total) into the muscle as needed for anaphylaxis.   ezetimibe 10 MG tablet Commonly known as: Zetia Take 1 tablet (10 mg total) by mouth daily.   freestyle lancets Check blood sugar once daily   glucose blood test strip Commonly known as: FreeStyle InsuLinx Test Test blood sugar once daily. Dx code: E11.9   losartan  100 MG tablet Commonly known as: COZAAR Take 1 tablet (100 mg total) by mouth daily.   metFORMIN 850 MG tablet Commonly known as: GLUCOPHAGE Take 1 tablet (850 mg total) by mouth 2 (two) times daily with a meal.   metoprolol tartrate 25 MG tablet Commonly known as: LOPRESSOR Take 1 tablet (25 mg total) by mouth 2 (two) times daily.   nitroGLYCERIN 0.4 MG SL tablet Commonly known as: NITROSTAT Place 1 tablet (0.4 mg total) under the tongue every 5 (five) minutes as needed for chest pain.   pioglitazone 30 MG tablet Commonly known as: ACTOS Take 1 tablet (30 mg total) by mouth daily.          Objective:   Physical Exam BP (!) 163/97 (BP Location: Right Arm, Patient Position: Sitting, Cuff Size: Normal)   Pulse (!) 58   Temp 98.8 F (37.1 C) (Oral)   Resp 16   Ht 6\' 1"  (1.854 m)   Wt 220 lb 2 oz (99.8 kg)   SpO2 100%   BMI 29.04 kg/m  General:   Well developed, NAD, BMI noted. HEENT:  Normocephalic . Face symmetric, atraumatic Lungs:  CTA B Normal respiratory effort, no  intercostal retractions, no accessory muscle use. Heart: RRR,  no murmur.  Lower extremities: no pretibial edema bilaterally  Skin: Not pale. Not jaundice Neurologic:  alert & oriented X3.  Speech normal, gait assisted by a cane. Psych--  Cognition and judgment appear intact.  Cooperative with normal attention span and concentration.  Behavior appropriate. No anxious or depressed appearing.      Assessment     Assessment DM 2009 HTN Hyperlipidemia CV: CAD 01/2017: had CP,  stress test show an area of fixed defect, saw cardiology, likely s/p a out-of-hospital MI at the time of CP. Rx Plavix /metoprolol  echo 02/2017 nl EF CVA 02/2018 per MRI, MRA neck head (-), echo ok; was on ASA, was rx plavix x 3 weeks  Renal: Dr [name] ---Glomerulonephritis 2010, BX proven ---Secondary hyperparathyroidism HOH L GU H/o Hematuria, urology w/u 09/2007 H/o Urinary retention 2010 BEE allergies, epipen Rx  09/2019  PLAN: DM: Last A1c very good, on Metformin, Actos, CBGs in the 130s, recheck A1c on RTC HTN: BP today slightly elevated, at home consistently 130.  Continue losartan, metoprolol, check a BMP. High cholesterol: Well-controlled per last FLP, on Lipitor/Zetia DJD: Recuperating from total knee replacement.  Seems to be doing well Preventive care: Flu shot today, encouraged a Covid booster RTC 4 months, CPX   This visit occurred during the SARS-CoV-2 public health emergency.  Safety protocols were in place, including screening questions prior to the visit, additional usage of staff PPE, and extensive cleaning of exam room while observing appropriate contact time as indicated for disinfecting solutions.

## 2020-05-11 NOTE — Patient Instructions (Addendum)
Per our records you are due for an eye exam. Please contact your eye doctor to schedule an appointment. Please have them send copies of your office visit notes to [place]. Our fax number is [phone].   Check the  blood pressure BP GOAL is between 110/65 and  135/85. If it is consistently higher or lower, let me know   GO TO THE LAB : Get the blood work     GO TO THE FRONT DESK, PLEASE SCHEDULE YOUR APPOINTMENTS Come back for checkup in 4 - 5 months

## 2020-05-11 NOTE — Progress Notes (Signed)
Pre visit review using our clinic review tool, if applicable. No additional management support is needed unless otherwise documented below in the visit note. 

## 2020-05-12 NOTE — Assessment & Plan Note (Signed)
DM: Last A1c very good, on Metformin, Actos, CBGs in the 130s, recheck A1c on RTC HTN: BP today slightly elevated, at home consistently 130.  Continue losartan, metoprolol, check a BMP. High cholesterol: Well-controlled per last FLP, on Lipitor/Zetia DJD: Recuperating from total knee replacement.  Seems to be doing well Preventive care: Flu shot today, encouraged a Covid booster RTC 4 months, CPX

## 2020-05-14 DIAGNOSIS — M25661 Stiffness of right knee, not elsewhere classified: Secondary | ICD-10-CM | POA: Diagnosis not present

## 2020-05-14 DIAGNOSIS — Z96651 Presence of right artificial knee joint: Secondary | ICD-10-CM | POA: Diagnosis not present

## 2020-05-14 DIAGNOSIS — M6281 Muscle weakness (generalized): Secondary | ICD-10-CM | POA: Diagnosis not present

## 2020-05-17 DIAGNOSIS — M6281 Muscle weakness (generalized): Secondary | ICD-10-CM | POA: Diagnosis not present

## 2020-05-17 DIAGNOSIS — Z96651 Presence of right artificial knee joint: Secondary | ICD-10-CM | POA: Diagnosis not present

## 2020-05-17 DIAGNOSIS — M25661 Stiffness of right knee, not elsewhere classified: Secondary | ICD-10-CM | POA: Diagnosis not present

## 2020-05-18 DIAGNOSIS — R809 Proteinuria, unspecified: Secondary | ICD-10-CM | POA: Diagnosis not present

## 2020-05-18 DIAGNOSIS — E0500 Thyrotoxicosis with diffuse goiter without thyrotoxic crisis or storm: Secondary | ICD-10-CM | POA: Diagnosis not present

## 2020-05-18 DIAGNOSIS — E785 Hyperlipidemia, unspecified: Secondary | ICD-10-CM | POA: Diagnosis not present

## 2020-05-18 DIAGNOSIS — N042 Nephrotic syndrome with diffuse membranous glomerulonephritis: Secondary | ICD-10-CM | POA: Diagnosis not present

## 2020-05-18 DIAGNOSIS — I639 Cerebral infarction, unspecified: Secondary | ICD-10-CM | POA: Diagnosis not present

## 2020-05-18 DIAGNOSIS — D649 Anemia, unspecified: Secondary | ICD-10-CM | POA: Diagnosis not present

## 2020-05-18 DIAGNOSIS — I2510 Atherosclerotic heart disease of native coronary artery without angina pectoris: Secondary | ICD-10-CM | POA: Diagnosis not present

## 2020-05-18 DIAGNOSIS — I129 Hypertensive chronic kidney disease with stage 1 through stage 4 chronic kidney disease, or unspecified chronic kidney disease: Secondary | ICD-10-CM | POA: Diagnosis not present

## 2020-05-18 DIAGNOSIS — N2581 Secondary hyperparathyroidism of renal origin: Secondary | ICD-10-CM | POA: Diagnosis not present

## 2020-05-18 DIAGNOSIS — E1122 Type 2 diabetes mellitus with diabetic chronic kidney disease: Secondary | ICD-10-CM | POA: Diagnosis not present

## 2020-05-18 LAB — BASIC METABOLIC PANEL
BUN: 19 (ref 4–21)
CO2: 26 — AB (ref 13–22)
Chloride: 104 (ref 99–108)
Creatinine: 1.2 (ref 0.6–1.3)
Glucose: 148
Potassium: 4 (ref 3.4–5.3)
Sodium: 138 (ref 137–147)

## 2020-05-18 LAB — COMPREHENSIVE METABOLIC PANEL
Albumin: 4 (ref 3.5–5.0)
Calcium: 9.4 (ref 8.7–10.7)
GFR calc Af Amer: 71
GFR calc non Af Amer: 62

## 2020-05-23 DIAGNOSIS — M6281 Muscle weakness (generalized): Secondary | ICD-10-CM | POA: Diagnosis not present

## 2020-05-23 DIAGNOSIS — M25661 Stiffness of right knee, not elsewhere classified: Secondary | ICD-10-CM | POA: Diagnosis not present

## 2020-05-23 DIAGNOSIS — Z96651 Presence of right artificial knee joint: Secondary | ICD-10-CM | POA: Diagnosis not present

## 2020-05-25 DIAGNOSIS — M6281 Muscle weakness (generalized): Secondary | ICD-10-CM | POA: Diagnosis not present

## 2020-05-25 DIAGNOSIS — M25661 Stiffness of right knee, not elsewhere classified: Secondary | ICD-10-CM | POA: Diagnosis not present

## 2020-05-25 DIAGNOSIS — Z96651 Presence of right artificial knee joint: Secondary | ICD-10-CM | POA: Diagnosis not present

## 2020-05-29 DIAGNOSIS — Z96651 Presence of right artificial knee joint: Secondary | ICD-10-CM | POA: Diagnosis not present

## 2020-05-29 DIAGNOSIS — M6281 Muscle weakness (generalized): Secondary | ICD-10-CM | POA: Diagnosis not present

## 2020-05-29 DIAGNOSIS — M25661 Stiffness of right knee, not elsewhere classified: Secondary | ICD-10-CM | POA: Diagnosis not present

## 2020-06-05 DIAGNOSIS — M6281 Muscle weakness (generalized): Secondary | ICD-10-CM | POA: Diagnosis not present

## 2020-06-05 DIAGNOSIS — M25661 Stiffness of right knee, not elsewhere classified: Secondary | ICD-10-CM | POA: Diagnosis not present

## 2020-06-05 DIAGNOSIS — Z96651 Presence of right artificial knee joint: Secondary | ICD-10-CM | POA: Diagnosis not present

## 2020-06-07 DIAGNOSIS — M25661 Stiffness of right knee, not elsewhere classified: Secondary | ICD-10-CM | POA: Diagnosis not present

## 2020-06-07 DIAGNOSIS — M6281 Muscle weakness (generalized): Secondary | ICD-10-CM | POA: Diagnosis not present

## 2020-06-07 DIAGNOSIS — Z96651 Presence of right artificial knee joint: Secondary | ICD-10-CM | POA: Diagnosis not present

## 2020-06-21 ENCOUNTER — Encounter: Payer: Self-pay | Admitting: Internal Medicine

## 2020-07-10 DIAGNOSIS — Z471 Aftercare following joint replacement surgery: Secondary | ICD-10-CM | POA: Diagnosis not present

## 2020-07-10 DIAGNOSIS — Z96651 Presence of right artificial knee joint: Secondary | ICD-10-CM | POA: Diagnosis not present

## 2020-07-10 DIAGNOSIS — M25661 Stiffness of right knee, not elsewhere classified: Secondary | ICD-10-CM | POA: Diagnosis not present

## 2020-08-09 DIAGNOSIS — Z471 Aftercare following joint replacement surgery: Secondary | ICD-10-CM | POA: Diagnosis not present

## 2020-08-09 DIAGNOSIS — Z96651 Presence of right artificial knee joint: Secondary | ICD-10-CM | POA: Diagnosis not present

## 2020-08-10 ENCOUNTER — Other Ambulatory Visit: Payer: Self-pay | Admitting: Internal Medicine

## 2020-08-10 DIAGNOSIS — E118 Type 2 diabetes mellitus with unspecified complications: Secondary | ICD-10-CM

## 2020-08-13 ENCOUNTER — Other Ambulatory Visit: Payer: Self-pay | Admitting: Internal Medicine

## 2020-08-15 ENCOUNTER — Other Ambulatory Visit: Payer: Self-pay | Admitting: Internal Medicine

## 2020-09-21 ENCOUNTER — Encounter: Payer: Self-pay | Admitting: Internal Medicine

## 2020-09-21 ENCOUNTER — Ambulatory Visit (INDEPENDENT_AMBULATORY_CARE_PROVIDER_SITE_OTHER): Payer: Medicare HMO | Admitting: Internal Medicine

## 2020-09-21 ENCOUNTER — Other Ambulatory Visit: Payer: Self-pay

## 2020-09-21 VITALS — BP 152/82 | HR 66 | Temp 98.2°F | Resp 18 | Ht 73.0 in | Wt 223.1 lb

## 2020-09-21 DIAGNOSIS — Z0001 Encounter for general adult medical examination with abnormal findings: Secondary | ICD-10-CM

## 2020-09-21 DIAGNOSIS — E7800 Pure hypercholesterolemia, unspecified: Secondary | ICD-10-CM | POA: Diagnosis not present

## 2020-09-21 DIAGNOSIS — Z0000 Encounter for general adult medical examination without abnormal findings: Secondary | ICD-10-CM

## 2020-09-21 DIAGNOSIS — E118 Type 2 diabetes mellitus with unspecified complications: Secondary | ICD-10-CM

## 2020-09-21 DIAGNOSIS — I10 Essential (primary) hypertension: Secondary | ICD-10-CM

## 2020-09-21 DIAGNOSIS — Z1211 Encounter for screening for malignant neoplasm of colon: Secondary | ICD-10-CM

## 2020-09-21 MED ORDER — EZETIMIBE 10 MG PO TABS: 10.0000 mg | ORAL_TABLET | Freq: Every day | ORAL | 3 refills | Status: DC

## 2020-09-21 MED ORDER — HYDROCHLOROTHIAZIDE 25 MG PO TABS: 25.0000 mg | ORAL_TABLET | Freq: Every day | ORAL | 0 refills | Status: DC

## 2020-09-21 NOTE — Progress Notes (Signed)
Subjective:    Patient ID: Calvin Serano Sr., male    DOB: 11/19/1955, 65 y.o.   MRN: [id number]  DOS:  09/21/2020 Type of visit - description: CPX In general feels well. Still having issues with the right knee. Ambulatory BPs remain somewhat elevated BP Readings from Last 3 Encounters:  09/21/20 (!) 152/82  05/11/20 (!) 163/97  01/27/20 (!) 136/75     Review of Systems  Other than above, a 14 point review of systems is negative      Past Medical History:  Diagnosis Date  . Decreased hearing    Left  . Diabetes mellitus 2009   adult onset  . Glomerulonephritis 12/2008   bx provem membranous GN, sees renal q year  . Grave's disease    reportedly, history of i nthe past u/s 6/11 slightly enlarged gland w/o nodules  . H/O: hematuria    saw urology 3/09, neg w/u  . Hyperlipemia   . Hypertension   . Secondary hyperparathyroidism, renal (HCC)    Dr. [name]  . Stroke (HCC)   . Urinary retention 01/2009   was seen at the ER and followup by urology    Past Surgical History:  Procedure Laterality Date  . right knee surgery     around 2015, 2021    Allergies as of 09/21/2020      Reactions   Bee Venom Other (See Comments)   Unknown- Pt given Epi-pen   Penicillins    Unsure if this drug was the cause of rash      Medication List       Accurate as of September 21, 2020 11:59 PM. If you have any questions, ask your nurse or doctor.        aspirin 81 MG EC tablet Take 1 tablet (81 mg total) by mouth daily.   atorvastatin 80 MG tablet Commonly known as: LIPITOR Take 1 tablet (80 mg total) by mouth daily.   EPINEPHrine 0.3 mg/0.3 mL Soaj injection Commonly known as: EpiPen 2-Pak Inject 0.3 mLs (0.3 mg total) into the muscle as needed for anaphylaxis.   ezetimibe 10 MG tablet Commonly known as: Zetia Take 1 tablet (10 mg total) by mouth daily.   freestyle lancets Check blood sugar once daily   glucose blood test strip Commonly known as: FreeStyle InsuLinx  Test Test blood sugar once daily. Dx code: E11.9   hydrochlorothiazide 25 MG tablet Commonly known as: HYDRODIURIL Take 1 tablet (25 mg total) by mouth daily. Started by: Willow Ora, MD   losartan 100 MG tablet Commonly known as: COZAAR Take 1 tablet (100 mg total) by mouth daily.   metFORMIN 850 MG tablet Commonly known as: GLUCOPHAGE Take 1 tablet (850 mg total) by mouth 2 (two) times daily with a meal.   metoprolol tartrate 25 MG tablet Commonly known as: LOPRESSOR Take 1 tablet (25 mg total) by mouth 2 (two) times daily.   nitroGLYCERIN 0.4 MG SL tablet Commonly known as: NITROSTAT Place 1 tablet (0.4 mg total) under the tongue every 5 (five) minutes as needed for chest pain.   pioglitazone 30 MG tablet Commonly known as: ACTOS Take 1 tablet (30 mg total) by mouth daily.          Objective:   Physical Exam BP (!) 152/82 (BP Location: Left Arm, Patient Position: Sitting, Cuff Size: Normal)   Pulse 66   Temp 98.2 F (36.8 C) (Oral)   Resp 18   Ht 6\' 1"  (1.854 m)   Wt  223 lb 2 oz (101.2 kg)   SpO2 97%   BMI 29.44 kg/m  General: Well developed, NAD, BMI noted Neck: No  thyromegaly  HEENT:  Normocephalic . Face symmetric, atraumatic Lungs:  CTA B Normal respiratory effort, no intercostal retractions, no accessory muscle use. Heart: RRR,  no murmur.  Abdomen:  Not distended, soft, non-tender. No rebound or rigidity.   Lower extremities: no pretibial edema bilaterally  Skin: Exposed areas without rash. Not pale. Not jaundice DRE: Normal sphincter tone, no stools, prostate normal. Neurologic:  alert & oriented X3.  Speech normal, gait appropriate for age and unassisted Strength symmetric and appropriate for age.  Psych: Cognition and judgment appear intact.  Cooperative with normal attention span and concentration.  Behavior appropriate. No anxious or depressed appearing.     Assessment      Assessment DM 2009 HTN Hyperlipidemia CV: CAD 01/2017:  had CP,  stress test show an area of fixed defect, saw cardiology, likely s/p a out-of-hospital MI at the time of CP. Rx Plavix /metoprolol  echo 02/2017 nl EF CVA 02/2018 per MRI, MRA neck head (-), echo ok; was on ASA, was rx plavix x 3 weeks  Renal: Dr [name] ---Glomerulonephritis 2010, BX proven ---Secondary hyperparathyroidism HOH L GU H/o Hematuria, urology w/u 09/2007 H/o Urinary retention 2010 BEE allergies, epipen Rx 09/2019  PLAN:  Here for CPX AJ:[id number] Metformin, Actos. HTN: Currently on losartan 100 mg, BP has been consistently above target at this office, at home in the range from 130s to 150s.  Add HCTZ, labs today and in 2 weeks.  Monitor BPs. High cholesterol: Checking labs, RF zetia, continue atorvastatin CAD: No symptoms RTC labs 2 weeks RTC checkup 4 months   In addition to CPX, his chronic medical problems were assessed. This visit occurred during the SARS-CoV-2 public health emergency.  Safety protocols were in place, including screening questions prior to the visit, additional usage of staff PPE, and extensive cleaning of exam room while observing appropriate contact time as indicated for disinfecting solutions.

## 2020-09-21 NOTE — Patient Instructions (Addendum)
Per our records you are due for an eye exam. Please contact your eye doctor to schedule an appointment. Please have them send copies of your office visit notes to [place]. Our fax number is [phone].  Call the [facility] office to set up your colonoscopy. [phone] Get a shot called Tdap at your pharmacy  Your blood pressure needs better control Add hydrochlorothiazide 25 mg 1 tablet every morning Continue checking your blood pressures at home BP GOAL is between 110/65 and  135/85.  GO TO THE LAB : Get the blood work     GO TO THE FRONT DESK, PLEASE SCHEDULE YOUR APPOINTMENTS Come back for   blood work in 2 weeks  Come back for a checkup in 4 months     Voluntades anticipadas Advance Directive  Las voluntades anticipadas son documentos legales que le permiten tomar decisiones acerca de su tratamiento mdico y atencin de la salud en caso de no poder expresarse usted mismo. Las voluntades anticipadas comunican sus deseos a familiares, amigos y mdicos. La elaboracin y la redaccin de las voluntades anticipadas deben realizarse con el transcurso del [place], en lugar de que suceda todo a la misma vez. Las voluntades anticipadas se pueden cambiar y Naval architect. Hay diferentes tipos de voluntades anticipadas, por ejemplo:  Poder notarial mdico.  Testamento vital.  Orden de no reanimar (ONR) o de no intentar la reanimacin (ONIR). Apoderado para asuntos de salud y poder notarial mdico Al apoderado para asuntos de salud tambin se le llama representante para asuntos de Freight forwarder. Esta persona es designada para tomar decisiones mdicas en representacin suya cuando usted no pueda tomar decisiones por usted mismo. Por lo general, las Camera operator piden a un amigo o familiar de confianza que acte como su apoderado y represente sus preferencias. Asegrese de tener un acuerdo con su persona de confianza para que acte como su apoderado. Es posible que un  apoderado deba tomar una decisin mdica en su nombre si no se conocen sus deseos. Un poder notarial mdico, tambin llamado poder notarial duradero para asuntos de [place], es un documento legal que nombra a su apoderado para asuntos de Freight forwarder. En funcin de las leyes de [address], es posible que el documento tambin deba estar:  [place].  Autenticado ante escribano.  Fechado.  Copiado.  Atestiguado.  Incorporado a la historia clnica del paciente. Tambin es posible que desee designar a una persona de confianza para administrar su dinero en caso de que usted no pueda hacerlo. Esto se denomina poder notarial duradero para asuntos financieros. Este es un documento legal diferente del poder notarial duradero para asuntos de salud. Usted puede elegir a su apoderado para asuntos de salud o a una persona diferente para que acte como su apoderado en los asuntos de dinero. Si no asigna un apoderado, o si se considera que el apoderado no est actuando para su bien, un tribunal podr Sales promotion account executive legal para que acte en representacin suya. El testamento vital El testamento vital es un conjunto de instrucciones en las que se establecen sus deseos acerca de la atencin mdica para el momento en que no pueda expresarlos usted mismo. Los mdicos deben conservar una copia del testamento vital en su historia clnica. Le recomendamos que entregue una copia a familiares o amigos. Con el fin de alertar a sus cuidadores en caso de Radio broadcast assistant, puede colocar una tarjeta en la billetera que indique que tiene un testamento vital y dnde Surveyor, mining. El  testamento vital se [place] si:  Tiene una enfermedad terminal.  Queda discapacitado.  No puede comunicarse ni tomar decisiones. Debe incluir las siguientes decisiones en su testamento vital:  El uso o no uso de equipos de soporte vital, como dispositivos de dilisis y Physicist, medical respiratorias (respiradores).  Si desea una ONR o [place]. Esto les indica a los  mdicos que no usen Equities trader (RCP) si se le detiene la respiracin o los latidos cardacos.  El [place] o no uso de alimentacin por sonda.  La administracin o no administracin de alimentos y lquidos.  Si desea recibir atencin de alivio (cuidados paliativos) cuando el objetivo sea la comodidad ms que la [place].  Si desea donar sus rganos y tejidos. El testamento vital no da instrucciones acerca de la distribucin del dinero ni las propiedades en caso de fallecimiento. ONR u ONIR La ONR es el pedido de no Statistician en el caso de que el corazn o la respiracin se Insurance claims handler. Si no se realiz ni se present [employer] u ONIR, el mdico intentar ayudar a cualquier paciente cuyo corazn o respiracin se haya detenido. Si planea someterse a [place], hable con su mdico sobre el cumplimiento de su ONR u ONIR en caso de que surjan problemas. Qu sucede si no tengo una voluntad anticipada? Algunos estados asignan familiares a cargo de la toma de decisiones para que acten en representacin suya si no tiene una voluntad anticipada. Cada estado tiene sus propias leyes sobre las voluntades anticipadas. Es posible que desee consultar a su mdico, su abogado o al representante estatal acerca de las leyes de su [place]. Resumen  Las voluntades anticipadas son documentos legales que le permiten tomar decisiones acerca de su tratamiento mdico y atencin de la salud en caso de no poder expresarse usted mismo.  El proceso de Event organiser y Armed forces training and education officer las voluntades anticipadas debe realizarse con el transcurso del [place]. Usted puede cambiar y Economist las voluntades anticipadas en cualquier momento.  Las voluntades anticipadas pueden incluir un poder mdico, un testamento vital y [place] orden ONR u [place]. Esta informacin no tiene Theme park manager el consejo del mdico. Asegrese de hacerle al mdico cualquier pregunta que tenga. Document Revised: 04/19/2020 Document Reviewed:  04/19/2020 Elsevier Patient Education  2021 [employer].

## 2020-09-22 ENCOUNTER — Encounter: Payer: Self-pay | Admitting: Internal Medicine

## 2020-09-22 NOTE — Assessment & Plan Note (Signed)
Here for CPX PT:[id number] Metformin, Actos. HTN: Currently on losartan 100 mg, BP has been consistently above target at this office, at home in the range from 130s to 150s.  Add HCTZ, labs today and in 2 weeks.  Monitor BPs. High cholesterol: Checking labs, RF zetia, continue atorvastatin CAD: No symptoms RTC labs 2 weeks RTC checkup 4 months

## 2020-09-22 NOTE — Assessment & Plan Note (Signed)
--  Td 2011, rec Tdap at the pharmacy -pnm shot 09-2014; prevnar6-2017 - s/p shingrix x 2 - covid vaccine x3 - had a flu shot  CCS:: Cscope 12-06 Dr Bosie Clos: 2 polyps Cscope again 08-2011, +polyp-. Failed referral before, rec to call  GI Prostate cancer screening:DRE normal, check a PSA Diet and exercise: Trying to stay active but is limited by knee stiffness.  Encourage a healthy diet. Labs:  CMP, FLP, A1c, PSA Advance directives discussed

## 2020-10-04 ENCOUNTER — Encounter: Payer: Self-pay | Admitting: Internal Medicine

## 2020-10-05 ENCOUNTER — Other Ambulatory Visit: Payer: Self-pay

## 2020-10-05 ENCOUNTER — Other Ambulatory Visit (INDEPENDENT_AMBULATORY_CARE_PROVIDER_SITE_OTHER): Payer: Medicare HMO

## 2020-10-05 DIAGNOSIS — I10 Essential (primary) hypertension: Secondary | ICD-10-CM

## 2020-10-05 LAB — BASIC METABOLIC PANEL
BUN: 23 mg/dL (ref 6–23)
CO2: 32 mEq/L (ref 19–32)
Calcium: 9.4 mg/dL (ref 8.4–10.5)
Chloride: 101 mEq/L (ref 96–112)
Creatinine, Ser: 1.21 mg/dL (ref 0.40–1.50)
GFR: 62.57 mL/min (ref >60.00)
Glucose, Bld: 131 mg/dL — ABNORMAL HIGH (ref 70–99)
Potassium: 3.7 mEq/L (ref 3.5–5.1)
Sodium: 140 mEq/L (ref 135–145)

## 2020-10-09 DIAGNOSIS — Z96651 Presence of right artificial knee joint: Secondary | ICD-10-CM | POA: Diagnosis not present

## 2020-10-09 DIAGNOSIS — M25561 Pain in right knee: Secondary | ICD-10-CM | POA: Diagnosis not present

## 2020-10-09 MED ORDER — HYDROCHLOROTHIAZIDE 25 MG PO TABS: 25.0000 mg | ORAL_TABLET | Freq: Every day | ORAL | 1 refills | Status: DC

## 2020-10-09 NOTE — Addendum Note (Signed)
Addended byConrad Bassett D on: 10/09/2020 08:47 AM   Modules accepted: Orders

## 2020-10-21 ENCOUNTER — Other Ambulatory Visit: Payer: Self-pay | Admitting: Internal Medicine

## 2020-10-25 ENCOUNTER — Encounter: Payer: Self-pay | Admitting: Internal Medicine

## 2020-11-09 DIAGNOSIS — E1122 Type 2 diabetes mellitus with diabetic chronic kidney disease: Secondary | ICD-10-CM | POA: Diagnosis not present

## 2020-11-09 DIAGNOSIS — N2581 Secondary hyperparathyroidism of renal origin: Secondary | ICD-10-CM | POA: Diagnosis not present

## 2020-11-09 DIAGNOSIS — E0500 Thyrotoxicosis with diffuse goiter without thyrotoxic crisis or storm: Secondary | ICD-10-CM | POA: Diagnosis not present

## 2020-11-09 DIAGNOSIS — D649 Anemia, unspecified: Secondary | ICD-10-CM | POA: Diagnosis not present

## 2020-11-09 DIAGNOSIS — I639 Cerebral infarction, unspecified: Secondary | ICD-10-CM | POA: Diagnosis not present

## 2020-11-09 DIAGNOSIS — I129 Hypertensive chronic kidney disease with stage 1 through stage 4 chronic kidney disease, or unspecified chronic kidney disease: Secondary | ICD-10-CM | POA: Diagnosis not present

## 2020-11-09 DIAGNOSIS — N042 Nephrotic syndrome with diffuse membranous glomerulonephritis: Secondary | ICD-10-CM | POA: Diagnosis not present

## 2020-11-09 DIAGNOSIS — I2510 Atherosclerotic heart disease of native coronary artery without angina pectoris: Secondary | ICD-10-CM | POA: Diagnosis not present

## 2020-11-09 DIAGNOSIS — E785 Hyperlipidemia, unspecified: Secondary | ICD-10-CM | POA: Diagnosis not present

## 2020-11-09 DIAGNOSIS — R809 Proteinuria, unspecified: Secondary | ICD-10-CM | POA: Diagnosis not present

## 2020-12-28 ENCOUNTER — Other Ambulatory Visit: Payer: Self-pay | Admitting: Podiatry

## 2020-12-28 ENCOUNTER — Ambulatory Visit (INDEPENDENT_AMBULATORY_CARE_PROVIDER_SITE_OTHER): Payer: Medicare HMO | Admitting: Podiatry

## 2020-12-28 ENCOUNTER — Other Ambulatory Visit: Payer: Self-pay

## 2020-12-28 ENCOUNTER — Encounter: Payer: Self-pay | Admitting: Podiatry

## 2020-12-28 DIAGNOSIS — B351 Tinea unguium: Secondary | ICD-10-CM

## 2020-12-28 DIAGNOSIS — M7672 Peroneal tendinitis, left leg: Secondary | ICD-10-CM

## 2020-12-28 DIAGNOSIS — G629 Polyneuropathy, unspecified: Secondary | ICD-10-CM

## 2020-12-28 DIAGNOSIS — M79675 Pain in left toe(s): Secondary | ICD-10-CM

## 2020-12-28 MED ORDER — TRIAMCINOLONE ACETONIDE 10 MG/ML IJ SUSP
10.0000 mg | Freq: Once | INTRAMUSCULAR | Status: AC
  Administered 2020-12-28: 10 mg

## 2020-12-30 NOTE — Progress Notes (Signed)
Subjective:   Patient ID: Calvin Limbo Sr., male   DOB: 65 y.o.   MRN: [id number]   HPI Patient presents with severely thickened left hallux nail and also pain in the outside of the left over right foot and concerned about long-term blood sugar and A1c.  Patient does not smoke and tries to be active   Review of Systems  All other systems reviewed and are negative.      Objective:  Physical Exam Vitals and nursing note reviewed.  Constitutional:      Appearance: He is well-developed.  Pulmonary:     Effort: Pulmonary effort is normal.  Musculoskeletal:        General: Normal range of motion.  Skin:    General: Skin is warm.  Neurological:     Mental Status: He is alert.    Neurovascular status found to be intact with patient noted to have thickened deformed hallux nail left dystrophic also noted to have inflammation pain on the outside of the left over right foot around the fifth metatarsal base tendon extensor expansion.  Patient did not have any indication of muscle dysfunction     Assessment:  Peroneal tendinitis left along with mycotic nail infection with possible nail trauma damage     Plan:  H&P reviewed all conditions and discussed treatment options.  Organ to leave the nail alone as its not tender and treatment is going to happen and I went ahead did sterile prep and injected the peroneal complex left 3 mg Kenalog 5 mg Xylocaine with instructions on ice therapy and will be seen back if symptoms persist

## 2021-01-25 ENCOUNTER — Ambulatory Visit: Payer: Medicare HMO | Admitting: Internal Medicine

## 2021-02-06 ENCOUNTER — Other Ambulatory Visit: Payer: Self-pay | Admitting: Internal Medicine

## 2021-02-08 ENCOUNTER — Ambulatory Visit (INDEPENDENT_AMBULATORY_CARE_PROVIDER_SITE_OTHER): Payer: Medicare HMO | Admitting: Internal Medicine

## 2021-02-08 ENCOUNTER — Other Ambulatory Visit: Payer: Self-pay

## 2021-02-08 ENCOUNTER — Encounter: Payer: Self-pay | Admitting: Internal Medicine

## 2021-02-08 VITALS — BP 116/64 | HR 64 | Temp 98.6°F | Resp 16 | Ht 73.0 in | Wt 223.2 lb

## 2021-02-08 DIAGNOSIS — E118 Type 2 diabetes mellitus with unspecified complications: Secondary | ICD-10-CM | POA: Diagnosis not present

## 2021-02-08 DIAGNOSIS — E7800 Pure hypercholesterolemia, unspecified: Secondary | ICD-10-CM | POA: Diagnosis not present

## 2021-02-08 DIAGNOSIS — Z0100 Encounter for examination of eyes and vision without abnormal findings: Secondary | ICD-10-CM

## 2021-02-08 DIAGNOSIS — E119 Type 2 diabetes mellitus without complications: Secondary | ICD-10-CM

## 2021-02-08 LAB — HEMOGLOBIN A1C: Hgb A1c MFr Bld: 7.2 % — ABNORMAL HIGH (ref 4.6–6.5)

## 2021-02-08 LAB — LIPID PANEL
Cholesterol: 180 mg/dL (ref 0–200)
HDL: 51.2 mg/dL (ref >39.00)
LDL Cholesterol: 100 mg/dL — ABNORMAL HIGH (ref 0–99)
NonHDL: 128.81
Total CHOL/HDL Ratio: 4
Triglycerides: 142 mg/dL (ref 0.0–149.0)
VLDL: 28.4 mg/dL (ref 0.0–40.0)

## 2021-02-08 NOTE — Progress Notes (Signed)
Subjective:    Patient ID: Calvin Dietze Sr., male    DOB: 03-06-1956, 65 y.o.   MRN: [id number]  DOS:  02/08/2021 Type of visit - description: f/u  Today with talk about diabetes, hypertension, high cholesterol and renal disease. In general he is well. 3 weeks ago, he bumped the Rt side of his chest on a table, it hurt for a few days, now he is completely asymptomatic.  Lifestyle has improved, he retired and has more time for himself  Denies lower extremity paresthesias or edema   Review of Systems See above   Past Medical History:  Diagnosis Date   Decreased hearing    Left   Diabetes mellitus 2009   adult onset   Glomerulonephritis 12/2008   bx provem membranous GN, sees renal q year   Grave's disease    reportedly, history of i nthe past u/s 6/11 slightly enlarged gland w/o nodules   H/O: hematuria    saw urology 3/09, neg w/u   Hyperlipemia    Hypertension    Secondary hyperparathyroidism, renal (HCC)    Dr. [name]   Stroke [facility])    Urinary retention 01/2009   was seen at the ER and followup by urology    Past Surgical History:  Procedure Laterality Date   right knee surgery     around 2015, 2021    Allergies as of 02/08/2021       Reactions   Bee Venom Other (See Comments)   Unknown- Pt given Epi-pen   Penicillins    Unsure if this drug was the cause of rash        Medication List        Accurate as of February 08, 2021 11:59 PM. If you have any questions, ask your nurse or doctor.          atorvastatin 80 MG tablet Commonly known as: LIPITOR Take 1 tablet (80 mg total) by mouth daily.   freestyle lancets Check blood sugar once daily   glucose blood test strip Commonly known as: FreeStyle InsuLinx Test Test blood sugar once daily. Dx code: E11.9   hydrochlorothiazide 25 MG tablet Commonly known as: HYDRODIURIL Take 1 tablet (25 mg total) by mouth daily.   losartan 100 MG tablet Commonly known as: COZAAR Take 1 tablet (100 mg total) by  mouth daily.   metFORMIN 850 MG tablet Commonly known as: GLUCOPHAGE Take 1 tablet (850 mg total) by mouth 2 (two) times daily with a meal.   metoprolol tartrate 25 MG tablet Commonly known as: LOPRESSOR Take 1 tablet (25 mg total) by mouth 2 (two) times daily.   pioglitazone 30 MG tablet Commonly known as: ACTOS Take 1 tablet (30 mg total) by mouth daily.           Objective:   Physical Exam BP 116/64 (BP Location: Left Arm, Patient Position: Sitting, Cuff Size: Normal)   Pulse 64   Temp 98.6 F (37 C) (Oral)   Resp 16   Ht 6\' 1"  (1.854 m)   Wt 223 lb 4 oz (101.3 kg)   SpO2 96%   BMI 29.45 kg/m  General:   Well developed, NAD, BMI noted. HEENT:  Normocephalic . Face symmetric, atraumatic Lungs:  CTA B Normal respiratory effort, no intercostal retractions, no accessory muscle use. Chest wall: No TTP anywhere Heart: RRR,  no murmur.  DM foot exam: No edema, good pedal pulses, dystrophic nails throughout, pinprick examination normal Skin: Not pale. Not jaundice Neurologic:  alert & oriented X3.  Speech normal, gait appropriate for age and unassisted Psych--  Cognition and judgment appear intact.  Cooperative with normal attention span and concentration.  Behavior appropriate. No anxious or depressed appearing.      Assessment     Assessment DM 2009 HTN Hyperlipidemia CV: CAD 01/2017: had CP,  stress test show an area of fixed defect, saw cardiology, likely s/p a out-of-hospital MI at the time of CP. Rx Plavix /metoprolol  echo 02/2017 nl EF CVA 02/2018 per MRI, MRA neck head (-), echo ok; was on ASA, was rx plavix x 3 weeks  Renal: Dr [name] ---Glomerulonephritis 2010, BX proven ---Secondary hyperparathyroidism HOH L GU H/o Hematuria, urology w/u 09/2007 H/o Urinary retention 2010 BEE allergies, epipen Rx 09/2019  PLAN:  HTN: BP today is very good, reports normal ambulatory BPs, continue HCTZ, losartan, metoprolol. DM: Feet exam negative, continue  metformin, Actos, check A1c. High cholesterol: On Lipitor, checking labs Renal disease.  Saw nephrology 11/09/2020, labs were done, will get records. Chest contusion: Mild, now asymptomatic.  Observation. See AVS, recommend to see the eye doctor, call GI, get a Tdap RTC 6 months   This visit occurred during the SARS-CoV-2 public health emergency.  Safety protocols were in place, including screening questions prior to the visit, additional usage of staff PPE, and extensive cleaning of exam room while observing appropriate contact time as indicated for disinfecting solutions.

## 2021-02-08 NOTE — Patient Instructions (Addendum)
  We have placed a referral to an eye doctor for your diabetic eye exam. It is important that you start doing these once yearly to protect your vision. Please expect a call to schedule an appointment at your earliest convenience. Recommend to get at your pharmacy: TDAP (tetanus) You are overdue for your recall colonoscopy with [facility]. Please call them at your earliest convenience to schedule that, their number is [phone]. Please let us know if you need a referral. GO TO THE LAB : Get the blood work GO TO THE FRONT DESK, PLEASE SCHEDULE YOUR APPOINTMENTS Come back for a checkup in 6 months

## 2021-02-10 NOTE — Assessment & Plan Note (Signed)
HTN: BP today is very good, reports normal ambulatory BPs, continue HCTZ, losartan, metoprolol. DM: Feet exam negative, continue metformin, Actos, check A1c. High cholesterol: On Lipitor, checking labs Renal disease.  Saw nephrology 11/09/2020, labs were done, will get records. Chest contusion: Mild, now asymptomatic.  Observation. See AVS, recommend to see the eye doctor, call GI, get a Tdap RTC 6 months

## 2021-02-11 MED ORDER — EMPAGLIFLOZIN 10 MG PO TABS: 10.0000 mg | ORAL_TABLET | Freq: Every day | ORAL | 0 refills | Status: DC

## 2021-02-11 NOTE — Addendum Note (Signed)
Addended byConrad Sharon D on: 02/11/2021 04:57 PM   Modules accepted: Orders

## 2021-02-14 ENCOUNTER — Other Ambulatory Visit: Payer: Self-pay | Admitting: Internal Medicine

## 2021-03-14 ENCOUNTER — Other Ambulatory Visit (INDEPENDENT_AMBULATORY_CARE_PROVIDER_SITE_OTHER): Payer: Medicare HMO

## 2021-03-14 ENCOUNTER — Other Ambulatory Visit: Payer: Self-pay

## 2021-03-14 DIAGNOSIS — E118 Type 2 diabetes mellitus with unspecified complications: Secondary | ICD-10-CM | POA: Diagnosis not present

## 2021-03-14 LAB — BASIC METABOLIC PANEL
BUN: 25 mg/dL — ABNORMAL HIGH (ref 6–23)
CO2: 33 mEq/L — ABNORMAL HIGH (ref 19–32)
Calcium: 9.8 mg/dL (ref 8.4–10.5)
Chloride: 99 mEq/L (ref 96–112)
Creatinine, Ser: 1.24 mg/dL (ref 0.40–1.50)
GFR: 60.57 mL/min (ref >60.00)
Glucose, Bld: 114 mg/dL — ABNORMAL HIGH (ref 70–99)
Potassium: 3.7 mEq/L (ref 3.5–5.1)
Sodium: 139 mEq/L (ref 135–145)

## 2021-04-10 DIAGNOSIS — G8929 Other chronic pain: Secondary | ICD-10-CM | POA: Diagnosis not present

## 2021-04-10 DIAGNOSIS — M1990 Unspecified osteoarthritis, unspecified site: Secondary | ICD-10-CM | POA: Diagnosis not present

## 2021-04-10 DIAGNOSIS — E785 Hyperlipidemia, unspecified: Secondary | ICD-10-CM | POA: Diagnosis not present

## 2021-04-10 DIAGNOSIS — I252 Old myocardial infarction: Secondary | ICD-10-CM | POA: Diagnosis not present

## 2021-04-10 DIAGNOSIS — Z6829 Body mass index (BMI) 29.0-29.9, adult: Secondary | ICD-10-CM | POA: Diagnosis not present

## 2021-04-10 DIAGNOSIS — I10 Essential (primary) hypertension: Secondary | ICD-10-CM | POA: Diagnosis not present

## 2021-04-10 DIAGNOSIS — Z7982 Long term (current) use of aspirin: Secondary | ICD-10-CM | POA: Diagnosis not present

## 2021-04-10 DIAGNOSIS — K219 Gastro-esophageal reflux disease without esophagitis: Secondary | ICD-10-CM | POA: Diagnosis not present

## 2021-04-10 DIAGNOSIS — I25119 Atherosclerotic heart disease of native coronary artery with unspecified angina pectoris: Secondary | ICD-10-CM | POA: Diagnosis not present

## 2021-04-10 DIAGNOSIS — Z7984 Long term (current) use of oral hypoglycemic drugs: Secondary | ICD-10-CM | POA: Diagnosis not present

## 2021-04-10 DIAGNOSIS — E663 Overweight: Secondary | ICD-10-CM | POA: Diagnosis not present

## 2021-04-10 DIAGNOSIS — E1142 Type 2 diabetes mellitus with diabetic polyneuropathy: Secondary | ICD-10-CM | POA: Diagnosis not present

## 2021-04-23 DIAGNOSIS — Z96651 Presence of right artificial knee joint: Secondary | ICD-10-CM | POA: Diagnosis not present

## 2021-04-23 DIAGNOSIS — Z471 Aftercare following joint replacement surgery: Secondary | ICD-10-CM | POA: Diagnosis not present

## 2021-05-04 ENCOUNTER — Other Ambulatory Visit: Payer: Self-pay | Admitting: Internal Medicine

## 2021-06-04 DIAGNOSIS — I639 Cerebral infarction, unspecified: Secondary | ICD-10-CM | POA: Diagnosis not present

## 2021-06-04 DIAGNOSIS — N2581 Secondary hyperparathyroidism of renal origin: Secondary | ICD-10-CM | POA: Diagnosis not present

## 2021-06-04 DIAGNOSIS — E1122 Type 2 diabetes mellitus with diabetic chronic kidney disease: Secondary | ICD-10-CM | POA: Diagnosis not present

## 2021-06-04 DIAGNOSIS — E785 Hyperlipidemia, unspecified: Secondary | ICD-10-CM | POA: Diagnosis not present

## 2021-06-04 DIAGNOSIS — I129 Hypertensive chronic kidney disease with stage 1 through stage 4 chronic kidney disease, or unspecified chronic kidney disease: Secondary | ICD-10-CM | POA: Diagnosis not present

## 2021-06-04 DIAGNOSIS — D649 Anemia, unspecified: Secondary | ICD-10-CM | POA: Diagnosis not present

## 2021-06-04 DIAGNOSIS — I2510 Atherosclerotic heart disease of native coronary artery without angina pectoris: Secondary | ICD-10-CM | POA: Diagnosis not present

## 2021-06-04 DIAGNOSIS — N042 Nephrotic syndrome with diffuse membranous glomerulonephritis: Secondary | ICD-10-CM | POA: Diagnosis not present

## 2021-06-04 DIAGNOSIS — E0500 Thyrotoxicosis with diffuse goiter without thyrotoxic crisis or storm: Secondary | ICD-10-CM | POA: Diagnosis not present

## 2021-06-04 DIAGNOSIS — R809 Proteinuria, unspecified: Secondary | ICD-10-CM | POA: Diagnosis not present

## 2021-07-23 ENCOUNTER — Ambulatory Visit: Payer: Medicare HMO | Admitting: Internal Medicine

## 2021-07-23 ENCOUNTER — Ambulatory Visit (INDEPENDENT_AMBULATORY_CARE_PROVIDER_SITE_OTHER): Payer: Medicare HMO | Admitting: Internal Medicine

## 2021-07-23 ENCOUNTER — Encounter: Payer: Self-pay | Admitting: Internal Medicine

## 2021-07-23 VITALS — BP 132/68 | HR 79 | Temp 98.5°F | Resp 18 | Ht 73.0 in | Wt 221.5 lb

## 2021-07-23 DIAGNOSIS — H6690 Otitis media, unspecified, unspecified ear: Secondary | ICD-10-CM

## 2021-07-23 DIAGNOSIS — J029 Acute pharyngitis, unspecified: Secondary | ICD-10-CM | POA: Diagnosis not present

## 2021-07-23 DIAGNOSIS — E7800 Pure hypercholesterolemia, unspecified: Secondary | ICD-10-CM | POA: Diagnosis not present

## 2021-07-23 DIAGNOSIS — E118 Type 2 diabetes mellitus with unspecified complications: Secondary | ICD-10-CM

## 2021-07-23 LAB — HEMOGLOBIN A1C: Hgb A1c MFr Bld: 7.4 % — ABNORMAL HIGH (ref 4.6–6.5)

## 2021-07-23 LAB — LIPID PANEL
Cholesterol: 160 mg/dL (ref 0–200)
HDL: 54.7 mg/dL (ref >39.00)
LDL Cholesterol: 85 mg/dL (ref 0–99)
NonHDL: 105.14
Total CHOL/HDL Ratio: 3
Triglycerides: 102 mg/dL (ref 0.0–149.0)
VLDL: 20.4 mg/dL (ref 0.0–40.0)

## 2021-07-23 LAB — POCT RAPID STREP A (OFFICE): Rapid Strep A Screen: NEGATIVE

## 2021-07-23 LAB — POC COVID19 BINAXNOW: SARS Coronavirus 2 Ag: NEGATIVE

## 2021-07-23 MED ORDER — EZETIMIBE 10 MG PO TABS: 10.0000 mg | ORAL_TABLET | Freq: Every day | ORAL | Status: DC

## 2021-07-23 MED ORDER — AZITHROMYCIN 250 MG PO TABS: ORAL_TABLET | ORAL | 0 refills | Status: DC

## 2021-07-23 NOTE — Assessment & Plan Note (Signed)
URI, R OMA COVID-negative, rapid strep test negative. Plan: Zithromax for ear infection, see instructions HTN: BP today is very good, at home is getting similar readings, continue losartan, metoprolol, HCTZ.  Recent BMP very good High cholesterol, last LDL 100.  Used to be better.  Currently on Lipitor 80 mg and Zetia.  Recheck today. DM: Last A1c 7.2, Jardiance was added, unable to afford ,  on metformin, Actos.  States won't be able to take  expensive medication.  Check A1c.  Glipizide? Preventive care: Had a flu shot. COVID booster recommended once he feels better. RTC 4 months

## 2021-07-23 NOTE — Patient Instructions (Addendum)
Per our records you are due for your diabetic eye exam. Please contact your eye doctor to schedule an appointment. Please have them send copies of your office visit notes to [place]. Our fax number is [phone]. If you need a referral to an eye doctor please let us know.   You have a ear infection. Takes Zithromax, and antibiotics, for few days Tylenol as needed. Flonase 2 sprays on each side of the nose daily to help congestion. Call if not gradually better  Check the  blood pressure regularly BP GOAL is between 110/65 and  135/85. If it is consistently higher or lower, let me know.    Watch your diet closely. Proceed with a COVID booster in a couple of weeks once you feel better   GO TO THE LAB : Get the blood work     GO TO THE FRONT DESK, PLEASE SCHEDULE YOUR APPOINTMENTS Come back for   a checkup in 4 months

## 2021-07-23 NOTE — Progress Notes (Signed)
Subjective:    Patient ID: Calvin Minotti Sr., male    DOB: 01-Dec-1955, 66 y.o.   MRN: [id number]  DOS:  07/23/2021 Type of visit - description: Follow-up  Today with talk about his chronic medical problems. Also about 2 days ago developed sore throat, right ear pain, conjunctival redness injection, cough, forehead headache With the onset of symptoms, tested negative for COVID.  + Subjective fever No chest pain or difficulty breathing Some nausea, no vomiting. Some sputum with cough.   Review of Systems See above   Past Medical History:  Diagnosis Date   Decreased hearing    Left   Diabetes mellitus 2009   adult onset   Glomerulonephritis 12/2008   bx provem membranous GN, sees renal q year   Grave's disease    reportedly, history of i nthe past u/s 6/11 slightly enlarged gland w/o nodules   H/O: hematuria    saw urology 3/09, neg w/u   Hyperlipemia    Hypertension    Secondary hyperparathyroidism, renal ([place])    Dr. Justin Mend   Stroke [facility])    Urinary retention 01/2009   was seen at the ER and followup by urology    Past Surgical History:  Procedure Laterality Date   right knee surgery     around 2015, 2021    Current Outpatient Medications  Medication Instructions   atorvastatin (LIPITOR) 80 mg, Oral, Daily   empagliflozin (JARDIANCE) 10 mg, Oral, Daily before breakfast   glucose blood (FREESTYLE INSULINX TEST) test strip Test blood sugar once daily. Dx code: E11.9   hydrochlorothiazide (HYDRODIURIL) 25 mg, Oral, Daily   Lancets (FREESTYLE) lancets Check blood sugar once daily   losartan (COZAAR) 100 MG tablet TAKE 1 TABLET BY MOUTH EVERY DAY   metFORMIN (GLUCOPHAGE) 850 mg, Oral, 2 times daily with meals   metoprolol tartrate (LOPRESSOR) 25 MG tablet TAKE 1 TABLET BY MOUTH TWICE A DAY   pioglitazone (ACTOS) 30 mg, Oral, Daily       Objective:   Physical Exam BP 132/68 (BP Location: Left Arm, Patient Position: Sitting, Cuff Size: Normal)    Pulse 79    Temp  98.5 F (36.9 C) (Oral)    Resp 18    Ht 6\' 1"  (1.854 m)    Wt 221 lb 8 oz (100.5 kg)    SpO2 97%    BMI 29.22 kg/m  General:   Well developed, NAD, BMI noted. HEENT:  Normocephalic . Face symmetric, atraumatic R TM: Red, no bulge, no discharge.  TM is intact.  Canal normal L TM: Normal Throat: Moderate redness, no white patches.  No swelling, uvula midline. Nose: Moderately congested. Lungs:  CTA B Normal respiratory effort, no intercostal retractions, no accessory muscle use. Heart: RRR,  no murmur.  Lower extremities: no pretibial edema bilaterally  Skin: Not pale. Not jaundice Neurologic:  alert & oriented X3.  Speech normal, gait appropriate for age and unassisted Psych--  Cognition and judgment appear intact.  Cooperative with normal attention span and concentration.  Behavior appropriate. No anxious or depressed appearing.      Assessment    Assessment DM 2009 HTN Hyperlipidemia CV: CAD 01/2017: had CP,  stress test show an area of fixed defect, saw cardiology, likely s/p a out-of-hospital MI at the time of CP. Rx Plavix /metoprolol  echo 02/2017 nl EF CVA 02/2018 per MRI, MRA neck head (-), echo ok; was on ASA, was rx plavix x 3 weeks  Renal: Dr Justin Mend ---Glomerulonephritis 2010, BX  proven ---Secondary hyperparathyroidism HOH L GU H/o Hematuria, urology w/u 09/2007 H/o Urinary retention 2010 BEE allergies, epipen Rx 09/2019  PLAN:  URI, R OMA COVID-negative, rapid strep test negative. Plan: Zithromax for ear infection, see instructions HTN: BP today is very good, at home is getting similar readings, continue losartan, metoprolol, HCTZ.  Recent BMP very good High cholesterol, last LDL 100.  Used to be better.  Currently on Lipitor 80 mg and Zetia.  Recheck today. DM: Last A1c 7.2, Jardiance was added, unable to afford ,  on metformin, Actos.  States won't be able to take  expensive medication.  Check A1c.  Glipizide? Preventive care: Had a flu shot. COVID booster  recommended once he feels better. RTC 4 months   This visit occurred during the SARS-CoV-2 public health emergency.  Safety protocols were in place, including screening questions prior to the visit, additional usage of staff PPE, and extensive cleaning of exam room while observing appropriate contact time as indicated for disinfecting solutions.

## 2021-07-24 ENCOUNTER — Other Ambulatory Visit: Payer: Self-pay

## 2021-07-24 MED ORDER — GLIPIZIDE 5 MG PO TABS: 5.0000 mg | ORAL_TABLET | Freq: Every day | ORAL | 4 refills | Status: DC

## 2021-07-24 MED ORDER — FLUTICASONE PROPIONATE 50 MCG/ACT NA SUSP: 2.0000 | Freq: Every day | NASAL | 5 refills | Status: DC

## 2021-07-24 NOTE — Addendum Note (Signed)
Addended byConrad [place] D on: 07/24/2021 09:22 AM   Modules accepted: Orders

## 2021-07-25 DIAGNOSIS — E119 Type 2 diabetes mellitus without complications: Secondary | ICD-10-CM | POA: Diagnosis not present

## 2021-07-25 LAB — HM DIABETES EYE EXAM

## 2021-07-26 ENCOUNTER — Encounter: Payer: Self-pay | Admitting: Internal Medicine

## 2021-08-09 ENCOUNTER — Other Ambulatory Visit: Payer: Self-pay | Admitting: Internal Medicine

## 2021-08-14 ENCOUNTER — Ambulatory Visit (INDEPENDENT_AMBULATORY_CARE_PROVIDER_SITE_OTHER): Payer: Medicare HMO | Admitting: Internal Medicine

## 2021-08-14 DIAGNOSIS — R1013 Epigastric pain: Secondary | ICD-10-CM

## 2021-10-20 ENCOUNTER — Other Ambulatory Visit: Payer: Self-pay | Admitting: Internal Medicine

## 2021-11-02 ENCOUNTER — Other Ambulatory Visit: Payer: Self-pay | Admitting: Internal Medicine

## 2021-11-02 DIAGNOSIS — E118 Type 2 diabetes mellitus with unspecified complications: Secondary | ICD-10-CM

## 2021-11-06 ENCOUNTER — Other Ambulatory Visit: Payer: Self-pay | Admitting: Internal Medicine

## 2021-11-20 ENCOUNTER — Encounter: Payer: Self-pay | Admitting: Internal Medicine

## 2021-11-20 ENCOUNTER — Ambulatory Visit (INDEPENDENT_AMBULATORY_CARE_PROVIDER_SITE_OTHER): Payer: Medicare HMO | Admitting: Internal Medicine

## 2021-11-20 VITALS — BP 126/82 | HR 62 | Temp 98.2°F | Resp 16 | Ht 73.0 in | Wt 229.5 lb

## 2021-11-20 DIAGNOSIS — Z23 Encounter for immunization: Secondary | ICD-10-CM | POA: Diagnosis not present

## 2021-11-20 DIAGNOSIS — E118 Type 2 diabetes mellitus with unspecified complications: Secondary | ICD-10-CM | POA: Diagnosis not present

## 2021-11-20 DIAGNOSIS — Z125 Encounter for screening for malignant neoplasm of prostate: Secondary | ICD-10-CM

## 2021-11-20 DIAGNOSIS — I10 Essential (primary) hypertension: Secondary | ICD-10-CM | POA: Diagnosis not present

## 2021-11-20 DIAGNOSIS — E7800 Pure hypercholesterolemia, unspecified: Secondary | ICD-10-CM

## 2021-11-20 LAB — CBC WITH DIFFERENTIAL/PLATELET
Basophils Absolute: 0 10*3/uL (ref 0.0–0.1)
Basophils Relative: 0.7 % (ref 0.0–3.0)
Eosinophils Absolute: 0 10*3/uL (ref 0.0–0.7)
Eosinophils Relative: 0.7 % (ref 0.0–5.0)
HCT: 40.7 % (ref 39.0–52.0)
Hemoglobin: 13.9 g/dL (ref 13.0–17.0)
Lymphocytes Relative: 30 % (ref 12.0–46.0)
Lymphs Abs: 1.2 10*3/uL (ref 0.7–4.0)
MCHC: 34.2 g/dL (ref 30.0–36.0)
MCV: 89.4 fl (ref 78.0–100.0)
Monocytes Absolute: 0.4 10*3/uL (ref 0.1–1.0)
Monocytes Relative: 9.4 % (ref 3.0–12.0)
Neutro Abs: 2.5 10*3/uL (ref 1.4–7.7)
Neutrophils Relative %: 59.2 % (ref 43.0–77.0)
Platelets: 174 10*3/uL (ref 150.0–400.0)
RBC: 4.55 Mil/uL (ref 4.22–5.81)
RDW: 14.2 % (ref 11.5–15.5)
WBC: 4.2 10*3/uL (ref 4.0–10.5)

## 2021-11-20 LAB — COMPREHENSIVE METABOLIC PANEL
ALT: 20 U/L (ref 0–53)
AST: 18 U/L (ref 0–37)
Albumin: 4 g/dL (ref 3.5–5.2)
Alkaline Phosphatase: 94 U/L (ref 39–117)
BUN: 23 mg/dL (ref 6–23)
CO2: 30 mEq/L (ref 19–32)
Calcium: 9.4 mg/dL (ref 8.4–10.5)
Chloride: 104 mEq/L (ref 96–112)
Creatinine, Ser: 1.23 mg/dL (ref 0.40–1.50)
GFR: 60.86 mL/min (ref >60.00)
Glucose, Bld: 137 mg/dL — ABNORMAL HIGH (ref 70–99)
Potassium: 4 mEq/L (ref 3.5–5.1)
Sodium: 141 mEq/L (ref 135–145)
Total Bilirubin: 1.2 mg/dL (ref 0.2–1.2)
Total Protein: 7 g/dL (ref 6.0–8.3)

## 2021-11-20 LAB — PSA: PSA: 1.43 ng/mL (ref 0.10–4.00)

## 2021-11-20 LAB — HEMOGLOBIN A1C: Hgb A1c MFr Bld: 7.5 % — ABNORMAL HIGH (ref 4.6–6.5)

## 2021-11-20 NOTE — Progress Notes (Addendum)
Subjective:    Patient ID: Calvin Shimabukuro Sr., male    DOB: [birth year], 66 y.o.   MRN: [id number]  DOS:  11/20/2021 Type of visit - description: f/u  Since the last office visit is doing okay. Has not started glipizide. No recent ambulatory CBGs. He is trying to take a walk daily.   Review of Systems Denies chest pain or difficulty breathing.  No lower extremity edema  Past Medical History:  Diagnosis Date   Decreased hearing    Left   Diabetes mellitus 2009   adult onset   Glomerulonephritis 12/2008   bx provem membranous GN, sees renal q year   Grave's disease    reportedly, history of i nthe past u/s 6/11 slightly enlarged gland w/o nodules   H/O: hematuria    saw urology 3/09, neg w/u   Hyperlipemia    Hypertension    Secondary hyperparathyroidism, renal ([facility])    Dr. [name]   Stroke [facility])    Urinary retention 01/2009   was seen at the ER and followup by urology    Past Surgical History:  Procedure Laterality Date   right knee surgery     around 2015, 2021    Current Outpatient Medications  Medication Instructions   atorvastatin (LIPITOR) 80 MG tablet TAKE 1 TABLET BY MOUTH EVERY DAY   ezetimibe (ZETIA) 10 mg, Oral, Daily   fluticasone (FLONASE) 50 MCG/ACT nasal spray 2 sprays, Each Nare, Daily   glipiZIDE (GLUCOTROL) 5 MG tablet TAKE 1 TABLET BY MOUTH DAILY BEFORE BREAKFAST.   glucose blood (FREESTYLE INSULINX TEST) test strip Test blood sugar once daily. Dx code: E11.9   hydrochlorothiazide (HYDRODIURIL) 25 mg, Oral, Daily   Lancets (FREESTYLE) lancets Check blood sugar once daily   losartan (COZAAR) 100 MG tablet TAKE 1 TABLET BY MOUTH EVERY DAY   metFORMIN (GLUCOPHAGE) 850 mg, Oral, 2 times daily with meals   metoprolol tartrate (LOPRESSOR) 25 MG tablet TAKE 1 TABLET BY MOUTH TWICE A DAY   pioglitazone (ACTOS) 30 MG tablet TAKE 1 TABLET BY MOUTH EVERY DAY       Objective:   Physical Exam BP 126/82 (BP Location: Left Arm, Patient Position: Sitting, Cuff  Size: Normal)   Pulse 62   Temp 98.2 F (36.8 C) (Oral)   Resp 16   Ht 6\' 1"  (1.854 m)   Wt 229 lb 8 oz (104.1 kg)   SpO2 98%   BMI 30.28 kg/m  General:   Well developed, NAD, BMI noted. HEENT:  Normocephalic . Face symmetric, atraumatic Lungs:  CTA B Normal respiratory effort, no intercostal retractions, no accessory muscle use. Heart: RRR,  no murmur.  DM foot exam: No edema, good pedal pulses, pinprick examination normal.  Onychomycosis noted. Skin: Not pale. Not jaundice Neurologic:  alert & oriented X3.  Speech normal, gait appropriate for age and unassisted Psych--  Cognition and judgment appear intact.  Cooperative with normal attention span and concentration.  Behavior appropriate. No anxious or depressed appearing.      Assessment     Assessment DM 2009 HTN Hyperlipidemia CV: CAD 01/2017: had CP,  stress test show an area of fixed defect, saw cardiology, likely s/p a out-of-hospital MI at the time of CP. Rx Plavix /metoprolol  echo 02/2017 nl EF CVA 02/2018 per MRI, MRA neck head (-), echo ok; was on ASA, was rx plavix x 3 weeks  Renal: Dr 03/2018 ---Glomerulonephritis 2010, BX proven ---Secondary hyperparathyroidism HOH L GU H/o Hematuria, urology w/u 09/2007 H/o  Urinary retention 2010 BEE allergies, epipen Rx 09/2019  PLAN:  DM: On Actos, metformin.  Based on last A1c was 7.4 glipizide was rx but has not restarted yet , states --"I did not know what that was for". He is unable to get more expensive medicines. Feet exam negative. Plan: Check A1c, start glipizide HTN: Seems well controlled with losartan, metoprolol.  Check CMP and CBC.  Further advised with results  Hyperlipidemia: On Lipitor, Zetia.  Last LDL still over 70.  Recheck on return to the office. Preventive care: - PNM 20 today - DRE was done at the last physical but PSA not checked.  Will do today. RTC CPX 3 to 4 months

## 2021-11-20 NOTE — Patient Instructions (Addendum)
Start glipizide with your breakfast every day. ? ? ?Recommend to proceed with covid booster (bivalent) at your pharmacy.  ?  ? ?GO TO THE LAB : Get the blood work   ? ? ?[place], [place] ?Come back for a physical exam in 3 to 4 months ?

## 2021-11-20 NOTE — Assessment & Plan Note (Addendum)
  DM: On Actos, metformin.  Based on last A1c was 7.4 glipizide was rx but has not restarted yet , states --"I did not know what that was for". He is unable to get more expensive medicines. Feet exam negative. Plan: Check A1c, start glipizide HTN: Seems well controlled with losartan, metoprolol.  Check CMP and CBC.  Further advised with results  Hyperlipidemia: On Lipitor, Zetia.  Last LDL still over 70.  Recheck on return to the office. Preventive care: - PNM 20 today - DRE was done at the last physical but PSA not checked.  Will do today. RTC CPX 3 to 4 months

## 2021-11-22 ENCOUNTER — Other Ambulatory Visit: Payer: Self-pay | Admitting: Internal Medicine

## 2021-11-26 ENCOUNTER — Ambulatory Visit (INDEPENDENT_AMBULATORY_CARE_PROVIDER_SITE_OTHER): Payer: Medicare HMO

## 2021-11-26 VITALS — BP 175/82 | HR 62 | Temp 98.1°F | Resp 16 | Ht 73.0 in | Wt 234.2 lb

## 2021-11-26 DIAGNOSIS — Z0000 Encounter for general adult medical examination without abnormal findings: Secondary | ICD-10-CM

## 2021-11-26 DIAGNOSIS — H9190 Unspecified hearing loss, unspecified ear: Secondary | ICD-10-CM | POA: Diagnosis not present

## 2021-11-26 NOTE — Progress Notes (Signed)
Subjective:   Calvin LimboJorge Himes Sr. is a 66 y.o. male who presents for an Initial Medicare Annual Wellness Visit.  Review of Systems     Cardiac Risk Factors include: obesity (BMI >30kg/m2);advanced age (>4355men, 85>65 women);male gender;diabetes mellitus;hypertension;dyslipidemia     Objective:    Today's Vitals   11/26/21 0822  BP: (!) 175/82  Pulse: 62  Resp: 16  Temp: 98.1 F (36.7 C)  SpO2: 99%  Weight: 234 lb 3.2 oz (106.2 kg)  Height: 6\' 1"  (1.854 m)   Body mass index is 30.9 kg/m.     11/26/2021    8:27 AM 02/13/2018    3:21 PM 02/12/2018   10:12 PM  Advanced Directives  Does Patient Have a Medical Advance Directive? No No No  Would patient like information on creating a medical advance directive? No - Patient declined No - Patient declined     Current Medications (verified) Outpatient Encounter Medications as of 11/26/2021  Medication Sig   atorvastatin (LIPITOR) 80 MG tablet TAKE 1 TABLET BY MOUTH EVERY DAY   fluticasone (FLONASE) 50 MCG/ACT nasal spray Place 2 sprays into both nostrils daily.   glipiZIDE (GLUCOTROL) 5 MG tablet TAKE 1 TABLET BY MOUTH DAILY BEFORE BREAKFAST.   glucose blood (FREESTYLE INSULINX TEST) test strip Test blood sugar once daily. Dx code: E11.9   hydrochlorothiazide (HYDRODIURIL) 25 MG tablet TAKE 1 TABLET (25 MG TOTAL) BY MOUTH DAILY.   Lancets (FREESTYLE) lancets Check blood sugar once daily   losartan (COZAAR) 100 MG tablet TAKE 1 TABLET BY MOUTH EVERY DAY   metFORMIN (GLUCOPHAGE) 850 MG tablet TAKE 1 TABLET (850 MG TOTAL) BY MOUTH 2 (TWO) TIMES DAILY WITH A MEAL.   metoprolol tartrate (LOPRESSOR) 25 MG tablet TAKE 1 TABLET BY MOUTH TWICE A DAY   pioglitazone (ACTOS) 30 MG tablet TAKE 1 TABLET BY MOUTH EVERY DAY   [DISCONTINUED] ezetimibe (ZETIA) 10 MG tablet Take 1 tablet (10 mg total) by mouth daily.   No facility-administered encounter medications on file as of 11/26/2021.    Allergies (verified) Bee venom and Penicillins    History: Past Medical History:  Diagnosis Date   Decreased hearing    Left   Diabetes mellitus 2009   adult onset   Glomerulonephritis 12/2008   bx provem membranous GN, sees renal q year   Grave's disease    reportedly, history of i nthe past u/s 6/11 slightly enlarged gland w/o nodules   H/O: hematuria    saw urology 3/09, neg w/u   Hyperlipemia    Hypertension    Secondary hyperparathyroidism, renal (HCC)    Dr. Hyman HopesWebb   Stroke [facility](HCC)    Urinary retention 01/2009   was seen at the ER and followup by urology   Past Surgical History:  Procedure Laterality Date   right knee surgery     around 2015, 2021   Family History  Problem Relation Age of Onset   Diabetes Other        GM   Diabetes Maternal Grandmother    Hypertension Maternal Grandmother    Healthy Mother    Heart attack Neg Hx    Colon cancer Neg Hx    Prostate cancer Neg Hx    Social History   Socioeconomic History   Marital status: Married    Spouse name: Not on file   Number of children: 3   Years of education: Not on file   Highest education level: Not on file  Occupational History   Occupation:  welder  Tobacco Use   Smoking status: Former    Packs/day: 1.00    Types: Cigarettes    Quit date: 04/17/1989    Years since quitting: 32.6   Smokeless tobacco: Never  Substance and Sexual Activity   Alcohol use: No   Drug use: No   Sexual activity: Not on file  Other Topics Concern   Not on file  Social History Narrative   Original from [place], moved to [place] in the 80s , lives w/ wife   Social Determinants of Corporate investment banker Strain: Low Risk    Difficulty of Paying Living Expenses: Not hard at all  Food Insecurity: No Food Insecurity   Worried About Programme researcher, broadcasting/film/video in the Last Year: Never true   Barista in the Last Year: Never true  Transportation Needs: No Transportation Needs   Lack of Transportation (Medical): No   Lack of Transportation (Non-Medical): No  Physical  Activity: Sufficiently Active   Days of Exercise per Week: 7 days   Minutes of Exercise per Session: 40 min  Stress: No Stress Concern Present   Feeling of Stress : Not at all  Social Connections: Moderately Integrated   Frequency of Communication with Friends and Family: Three times a week   Frequency of Social Gatherings with Friends and Family: Once a week   Attends Religious Services: More than 4 times per year   Active Member of [employer] or Organizations: No   Attends Engineer, structural: Never   Marital Status: Married    Tobacco Counseling Counseling given: Not Answered   Clinical Intake:  Pre-visit preparation completed: Yes  Pain : No/denies pain     Nutritional Status: BMI > 30  Obese Nutritional Risks: None Diabetes: Yes CBG done?: No Did pt. bring in CBG monitor from home?: No  How often do you need to have someone help you when you read instructions, pamphlets, or other written materials from your doctor or pharmacy?: 1 - Never  Diabetic?Yes Nutrition Risk Assessment:  Has the patient had any N/V/D within the last 2 months?  No  Does the patient have any non-healing wounds?  No  Has the patient had any unintentional weight loss or weight gain?  No   Diabetes:  Is the patient diabetic?  Yes  If diabetic, was a CBG obtained today?  Yes  Did the patient bring in their glucometer from home?  No  How often do you monitor your CBG's? Once a week.   Financial Strains and Diabetes Management:  Are you having any financial strains with the device, your supplies or your medication? No .  Does the patient want to be seen by Chronic Care Management for management of their diabetes?  No  Would the patient like to be referred to a Nutritionist or for Diabetic Management?  No   Diabetic Exams:  Diabetic Eye Exam: Completed 07/25/21 Diabetic Foot Exam: Completed 02/08/21    Interpreter Needed?: No  Information entered by :: Evelynne Spiers   Activities  of Daily Living    11/26/2021    8:32 AM 02/08/2021    9:46 AM  In your present state of health, do you have any difficulty performing the following activities:  Hearing? 1 0  Vision? 0 0  Difficulty concentrating or making decisions? 0 0  Walking or climbing stairs? 0 0  Dressing or bathing? 0 0  Doing errands, shopping? 0 0  Preparing Food and eating ? N  Using the Toilet? N   In the past six months, have you accidently leaked urine? N   Do you have problems with loss of bowel control? N   Managing your Medications? N   Managing your Finances? N   Housekeeping or managing your Housekeeping? N     Patient Care Team: [name], MD as PCP - General Croitoru, Rachelle Hora, MD as PCP - Cardiology (Cardiology) Elvis Coil, MD as Consulting Physician (Nephrology) Charlott Rakes, MD as Consulting Physician (Gastroenterology) [name], MD as Consulting Physician (Orthopedic Surgery)  Indicate any recent Medical Services you may have received from other than [facility] providers in the past year (date may be approximate).     Assessment:   This is a routine wellness examination for Quamel.  Hearing/Vision screen No results found.  Dietary issues and exercise activities discussed: Current Exercise Habits: Home exercise routine, Type of exercise: walking, Time (Minutes): 40, Frequency (Times/Week): 7, Weekly Exercise (Minutes/Week): 280, Intensity: Mild   Goals Addressed   None    Depression Screen    11/26/2021    8:28 AM 07/23/2021    9:23 AM 09/21/2020    8:00 AM 09/30/2019    8:55 AM 08/20/2018   10:17 AM 11/06/2017    9:15 AM 06/20/2016    8:21 AM  PHQ 2/9 Scores  PHQ - 2 Score 0 0 0 0  0 0  Exception Documentation     Patient refusal      Fall Risk    11/26/2021    8:27 AM 07/23/2021    9:23 AM 09/21/2020    8:00 AM 09/30/2019    8:55 AM 03/19/2018    8:08 AM  Fall Risk   Falls in the past year? 0 0 0 0 No  Number falls in past yr: 0 0 0 0   Injury with Fall? 0 0 0 0    Risk for fall due to : No Fall Risks      Follow up Falls evaluation completed Falls evaluation completed  Falls evaluation completed     FALL RISK PREVENTION PERTAINING TO THE HOME:  Any stairs in or around the home? No  If so, are there any without handrails?  N/A Home free of loose throw rugs in walkways, pet beds, electrical cords, etc? Yes  Adequate lighting in your home to reduce risk of falls? Yes   ASSISTIVE DEVICES UTILIZED TO PREVENT FALLS:  Life alert? No  Use of a cane, walker or w/c? No  Grab bars in the bathroom? No  Shower chair or bench in shower? No  Elevated toilet seat or a handicapped toilet? No   TIMED UP AND GO:  Was the test performed? Yes .  Length of time to ambulate 10 feet: 9 sec.   Gait steady and fast without use of assistive device  Cognitive Function:        11/26/2021    8:34 AM  6CIT Screen  What Year? 0 points  What month? 0 points  What time? 0 points  Count back from 20 0 points  Months in reverse 4 points  Repeat phrase 0 points  Total Score 4 points    Immunizations Immunization History  Administered Date(s) Administered   Fluad Quad(high Dose 65+) 05/11/2020   Influenza Split 04/18/2011   Influenza Whole 05/07/2010   Influenza, High Dose Seasonal PF 04/08/2021   Influenza,inj,Quad PF,6+ Mos 04/08/2013, 05/26/2014, 04/03/2015, 06/20/2016, 05/01/2017   Influenza-Unspecified 04/23/2018, 04/01/2019   PFIZER Comirnaty(Gray Top)Covid-19 Tri-Sucrose Vaccine  12/28/2020   PFIZER(Purple Top)SARS-COV-2 Vaccination 08/11/2019, 09/05/2019, 07/13/2020   PNEUMOCOCCAL CONJUGATE-20 11/20/2021   Pneumococcal Conjugate-13 12/21/2015   Pneumococcal Polysaccharide-23 09/22/2014   Td 11/20/2009   Tdap 02/14/2021   Zoster Recombinat (Shingrix) 08/20/2018, 10/22/2018    TDAP status: Up to date  Flu Vaccine status: Up to date  Pneumococcal vaccine status: Up to date  Covid-19 vaccine status: Declined, Education has been provided  regarding the importance of this vaccine but patient still declined. Advised may receive this vaccine at local pharmacy or Health Dept.or vaccine clinic. Aware to provide a copy of the vaccination record if obtained from local pharmacy or Health Dept. Verbalized acceptance and understanding.  Qualifies for Shingles Vaccine? Yes   Zostavax completed No   Shingrix Completed?: Yes  Screening Tests Health Maintenance  Topic Date Due   COLONOSCOPY (Pts 45-76yrs Insurance coverage will need to be confirmed)  08/14/2016   COVID-19 Vaccine (5 - Booster for Pfizer series) 02/22/2021   INFLUENZA VACCINE  02/04/2022   FOOT EXAM  02/08/2022   HEMOGLOBIN A1C  05/23/2022   OPHTHALMOLOGY EXAM  07/25/2022   TETANUS/TDAP  02/15/2031   Pneumonia Vaccine 84+ Years old  Completed   Hepatitis C Screening  Completed   Zoster Vaccines- Shingrix  Completed   HPV VACCINES  Aged Out    Health Maintenance  Health Maintenance Due  Topic Date Due   COLONOSCOPY (Pts 45-38yrs Insurance coverage will need to be confirmed)  08/14/2016   COVID-19 Vaccine (5 - Booster for Pfizer series) 02/22/2021    Colorectal cancer screening: Referral to GI placed ordered already. Pt aware the office will call re: appt.  Lung Cancer Screening: (Low Dose CT Chest recommended if Age 90-80 years, 30 pack-year currently smoking OR have quit w/in 15years.) does not qualify.   Lung Cancer Screening Referral: N/A  Additional Screening:  Hepatitis C Screening: does qualify; Completed 03/02/15  Vision Screening: Recommended annual ophthalmology exams for early detection of glaucoma and other disorders of the eye. Is the patient up to date with their annual eye exam?  Yes  Who is the provider or what is the name of the office in which the patient attends annual eye exams? [facility] If pt is not established with a provider, would they like to be referred to a provider to establish care? No .   Dental Screening: Recommended annual  dental exams for proper oral hygiene  Community Resource Referral / Chronic Care Management: CRR required this visit?  No   CCM required this visit?  No      Plan:     I have personally reviewed and noted the following in the patient's chart:   Medical and social history Use of alcohol, tobacco or illicit drugs  Current medications and supplements including opioid prescriptions. Patient is not currently taking opioid prescriptions. Functional ability and status Nutritional status Physical activity Advanced directives List of other physicians Hospitalizations, surgeries, and ER visits in previous 12 months Vitals Screenings to include cognitive, depression, and falls Referrals and appointments  In addition, I have reviewed and discussed with patient certain preventive protocols, quality metrics, and best practice recommendations. A written personalized care plan for preventive services as well as general preventive health recommendations were provided to patient.     Salomon Mast Raford Brissett, CMA   11/26/2021   Nurse Notes: none

## 2021-11-26 NOTE — Patient Instructions (Signed)
Calvin Williams , Thank you for taking time to come for your Medicare Wellness Visit. I appreciate your ongoing commitment to your health goals. Please review the following plan we discussed and let me know if I can assist you in the future.   Screening recommendations/referrals: Colonoscopy: will schedule Recommended yearly ophthalmology/optometry visit for glaucoma screening and checkup Recommended yearly dental visit for hygiene and checkup  Vaccinations: Influenza vaccine: up to date Pneumococcal vaccine: up to date Tdap vaccine: up to date Shingles vaccine: up to date   Covid-19: Due-May obtain vaccine at your local pharmacy.   Advanced directives: no   Conditions/risks identified: see problem list   Next appointment: Follow up in one year for your annual wellness visit. 11/29/22  Preventive Care 65 Years and Older, Male Preventive care refers to lifestyle choices and visits with your health care provider that can promote health and wellness. What does preventive care include? A yearly physical exam. This is also called an annual well check. Dental exams once or twice a year. Routine eye exams. Ask your health care provider how often you should have your eyes checked. Personal lifestyle choices, including: Daily care of your teeth and gums. Regular physical activity. Eating a healthy diet. Avoiding tobacco and drug use. Limiting alcohol use. Practicing safe sex. Taking low doses of aspirin every day. Taking vitamin and mineral supplements as recommended by your health care provider. What happens during an annual well check? The services and screenings done by your health care provider during your annual well check will depend on your age, overall health, lifestyle risk factors, and family history of disease. Counseling  Your health care provider may ask you questions about your: Alcohol use. Tobacco use. Drug use. Emotional well-being. Home and relationship  well-being. Sexual activity. Eating habits. History of falls. Memory and ability to understand (cognition). Work and work Astronomer. Screening  You may have the following tests or measurements: Height, weight, and BMI. Blood pressure. Lipid and cholesterol levels. These may be checked every 5 years, or more frequently if you are over 27 years old. Skin check. Lung cancer screening. You may have this screening every year starting at age 17 if you have a 30-pack-year history of smoking and currently smoke or have quit within the past 15 years. Fecal occult blood test (FOBT) of the stool. You may have this test every year starting at age 34. Flexible sigmoidoscopy or colonoscopy. You may have a sigmoidoscopy every 5 years or a colonoscopy every 10 years starting at age 45. Prostate cancer screening. Recommendations will vary depending on your family history and other risks. Hepatitis C blood test. Hepatitis B blood test. Sexually transmitted disease (STD) testing. Diabetes screening. This is done by checking your blood sugar (glucose) after you have not eaten for a while (fasting). You may have this done every 1-3 years. Abdominal aortic aneurysm (AAA) screening. You may need this if you are a current or former smoker. Osteoporosis. You may be screened starting at age 71 if you are at high risk. Talk with your health care provider about your test results, treatment options, and if necessary, the need for more tests. Vaccines  Your health care provider may recommend certain vaccines, such as: Influenza vaccine. This is recommended every year. Tetanus, diphtheria, and acellular pertussis (Tdap, Td) vaccine. You may need a Td booster every 10 years. Zoster vaccine. You may need this after age 43. Pneumococcal 13-valent conjugate (PCV13) vaccine. One dose is recommended after age 50. Pneumococcal polysaccharide (PPSV23)  vaccine. One dose is recommended after age 57. Talk to your health care  provider about which screenings and vaccines you need and how often you need them. This information is not intended to replace advice given to you by your health care provider. Make sure you discuss any questions you have with your health care provider. Document Released: 07/20/2015 Document Revised: 03/12/2016 Document Reviewed: 04/24/2015 Elsevier Interactive Patient Education  2017 [place] Prevention in the Home Falls can cause injuries. They can happen to people of all ages. There are many things you can do to make your home safe and to help prevent falls. What can I do on the outside of my home? Regularly fix the edges of walkways and driveways and fix any cracks. Remove anything that might make you trip as you walk through a door, such as a raised step or threshold. Trim any bushes or trees on the path to your home. Use bright outdoor lighting. Clear any walking paths of anything that might make someone trip, such as rocks or tools. Regularly check to see if handrails are loose or broken. Make sure that both sides of any steps have handrails. Any raised decks and porches should have guardrails on the edges. Have any leaves, snow, or ice cleared regularly. Use sand or salt on walking paths during winter. Clean up any spills in your garage right away. This includes oil or grease spills. What can I do in the bathroom? Use night lights. Install grab bars by the toilet and in the tub and shower. Do not use towel bars as grab bars. Use non-skid mats or decals in the tub or shower. If you need to sit down in the shower, use a plastic, non-slip stool. Keep the floor dry. Clean up any water that spills on the floor as soon as it happens. Remove soap buildup in the tub or shower regularly. Attach bath mats securely with double-sided non-slip rug tape. Do not have throw rugs and other things on the floor that can make you trip. What can I do in the bedroom? Use night lights. Make  sure that you have a light by your bed that is easy to reach. Do not use any sheets or blankets that are too big for your bed. They should not hang down onto the floor. Have a firm chair that has side arms. You can use this for support while you get dressed. Do not have throw rugs and other things on the floor that can make you trip. What can I do in the kitchen? Clean up any spills right away. Avoid walking on wet floors. Keep items that you use a lot in easy-to-reach places. If you need to reach something above you, use a strong step stool that has a grab bar. Keep electrical cords out of the way. Do not use floor polish or wax that makes floors slippery. If you must use wax, use non-skid floor wax. Do not have throw rugs and other things on the floor that can make you trip. What can I do with my stairs? Do not leave any items on the stairs. Make sure that there are handrails on both sides of the stairs and use them. Fix handrails that are broken or loose. Make sure that handrails are as long as the stairways. Check any carpeting to make sure that it is firmly attached to the stairs. Fix any carpet that is loose or worn. Avoid having throw rugs at the top or bottom  of the stairs. If you do have throw rugs, attach them to the floor with carpet tape. Make sure that you have a light switch at the top of the stairs and the bottom of the stairs. If you do not have them, ask someone to add them for you. What else can I do to help prevent falls? Wear shoes that: Do not have high heels. Have rubber bottoms. Are comfortable and fit you well. Are closed at the toe. Do not wear sandals. If you use a stepladder: Make sure that it is fully opened. Do not climb a closed stepladder. Make sure that both sides of the stepladder are locked into place. Ask someone to hold it for you, if possible. Clearly mark and make sure that you can see: Any grab bars or handrails. First and last steps. Where the  edge of each step is. Use tools that help you move around (mobility aids) if they are needed. These include: Canes. Walkers. Scooters. Crutches. Turn on the lights when you go into a dark area. Replace any light bulbs as soon as they burn out. Set up your furniture so you have a clear path. Avoid moving your furniture around. If any of your floors are uneven, fix them. If there are any pets around you, be aware of where they are. Review your medicines with your doctor. Some medicines can make you feel dizzy. This can increase your chance of falling. Ask your doctor what other things that you can do to help prevent falls. This information is not intended to replace advice given to you by your health care provider. Make sure you discuss any questions you have with your health care provider. Document Released: 04/19/2009 Document Revised: 11/29/2015 Document Reviewed: 07/28/2014 Elsevier Interactive Patient Education  2017 [employer].

## 2021-12-04 DIAGNOSIS — R809 Proteinuria, unspecified: Secondary | ICD-10-CM | POA: Diagnosis not present

## 2021-12-04 DIAGNOSIS — N181 Chronic kidney disease, stage 1: Secondary | ICD-10-CM | POA: Diagnosis not present

## 2021-12-04 DIAGNOSIS — N022 Recurrent and persistent hematuria with diffuse membranous glomerulonephritis: Secondary | ICD-10-CM | POA: Diagnosis not present

## 2021-12-04 DIAGNOSIS — I129 Hypertensive chronic kidney disease with stage 1 through stage 4 chronic kidney disease, or unspecified chronic kidney disease: Secondary | ICD-10-CM | POA: Diagnosis not present

## 2021-12-04 DIAGNOSIS — E1122 Type 2 diabetes mellitus with diabetic chronic kidney disease: Secondary | ICD-10-CM | POA: Diagnosis not present

## 2021-12-05 LAB — PROTEIN / CREATININE RATIO, URINE: Creatinine, Urine: 49.3

## 2021-12-11 ENCOUNTER — Encounter: Payer: Self-pay | Admitting: Internal Medicine

## 2021-12-24 DIAGNOSIS — H90A31 Mixed conductive and sensorineural hearing loss, unilateral, right ear with restricted hearing on the contralateral side: Secondary | ICD-10-CM | POA: Diagnosis not present

## 2021-12-24 DIAGNOSIS — H9313 Tinnitus, bilateral: Secondary | ICD-10-CM | POA: Diagnosis not present

## 2021-12-24 DIAGNOSIS — Z570 Occupational exposure to noise: Secondary | ICD-10-CM | POA: Diagnosis not present

## 2021-12-24 DIAGNOSIS — H90A22 Sensorineural hearing loss, unilateral, left ear, with restricted hearing on the contralateral side: Secondary | ICD-10-CM | POA: Diagnosis not present

## 2021-12-31 DIAGNOSIS — H902 Conductive hearing loss, unspecified: Secondary | ICD-10-CM

## 2022-01-01 ENCOUNTER — Telehealth: Payer: Self-pay | Admitting: Internal Medicine

## 2022-01-01 MED ORDER — EPINEPHRINE 0.3 MG/0.3ML IJ SOAJ: 0.3000 mg | INTRAMUSCULAR | 2 refills | Status: DC | PRN

## 2022-01-01 NOTE — Telephone Encounter (Signed)
Rx sent in and left message on machine that rx has been sent into pharmacy.

## 2022-01-01 NOTE — Telephone Encounter (Signed)
Pt came in office stating that is needing a rx send for his epic pen, (pt informed that the one that he has at home has expired and is needing a new one) send to pharmacy :   [id number] [place], [place] - [address]  [address] [place] [address]  Phone:  [phone]  Fax:  [phone]. Please advise. Pt tel  [phone].

## 2022-01-01 NOTE — Telephone Encounter (Signed)
Pt aware.

## 2022-01-14 ENCOUNTER — Encounter: Payer: Self-pay | Admitting: Internal Medicine

## 2022-01-27 ENCOUNTER — Other Ambulatory Visit: Payer: Self-pay | Admitting: Internal Medicine

## 2022-02-28 ENCOUNTER — Encounter: Payer: Self-pay | Admitting: Internal Medicine

## 2022-05-23 ENCOUNTER — Other Ambulatory Visit: Payer: Self-pay | Admitting: Internal Medicine

## 2022-06-06 ENCOUNTER — Encounter: Payer: Medicare HMO | Admitting: Internal Medicine

## 2022-06-24 ENCOUNTER — Encounter: Payer: Self-pay | Admitting: Internal Medicine

## 2022-06-24 ENCOUNTER — Ambulatory Visit (INDEPENDENT_AMBULATORY_CARE_PROVIDER_SITE_OTHER): Payer: Medicare HMO | Admitting: Internal Medicine

## 2022-06-24 VITALS — BP 138/68 | HR 65 | Temp 98.0°F | Resp 16 | Ht 73.0 in | Wt 239.0 lb

## 2022-06-24 DIAGNOSIS — I10 Essential (primary) hypertension: Secondary | ICD-10-CM

## 2022-06-24 DIAGNOSIS — Z0001 Encounter for general adult medical examination with abnormal findings: Secondary | ICD-10-CM | POA: Diagnosis not present

## 2022-06-24 DIAGNOSIS — E118 Type 2 diabetes mellitus with unspecified complications: Secondary | ICD-10-CM

## 2022-06-24 DIAGNOSIS — L84 Corns and callosities: Secondary | ICD-10-CM

## 2022-06-24 DIAGNOSIS — Z0000 Encounter for general adult medical examination without abnormal findings: Secondary | ICD-10-CM

## 2022-06-24 DIAGNOSIS — B351 Tinea unguium: Secondary | ICD-10-CM

## 2022-06-24 DIAGNOSIS — E7800 Pure hypercholesterolemia, unspecified: Secondary | ICD-10-CM

## 2022-06-24 DIAGNOSIS — Z1211 Encounter for screening for malignant neoplasm of colon: Secondary | ICD-10-CM

## 2022-06-24 LAB — AST: AST: 22 U/L (ref 0–37)

## 2022-06-24 LAB — LIPID PANEL
Cholesterol: 178 mg/dL (ref 0–200)
HDL: 58.7 mg/dL (ref >39.00)
LDL Cholesterol: 96 mg/dL (ref 0–99)
NonHDL: 118.84
Total CHOL/HDL Ratio: 3
Triglycerides: 114 mg/dL (ref 0.0–149.0)
VLDL: 22.8 mg/dL (ref 0.0–40.0)

## 2022-06-24 LAB — BASIC METABOLIC PANEL
BUN: 24 mg/dL — ABNORMAL HIGH (ref 6–23)
CO2: 29 mEq/L (ref 19–32)
Calcium: 9.5 mg/dL (ref 8.4–10.5)
Chloride: 100 mEq/L (ref 96–112)
Creatinine, Ser: 1.27 mg/dL (ref 0.40–1.50)
GFR: 58.33 mL/min — ABNORMAL LOW (ref >60.00)
Glucose, Bld: 78 mg/dL (ref 70–99)
Potassium: 3.7 mEq/L (ref 3.5–5.1)
Sodium: 137 mEq/L (ref 135–145)

## 2022-06-24 LAB — HEMOGLOBIN A1C: Hgb A1c MFr Bld: 6.8 % — ABNORMAL HIGH (ref 4.6–6.5)

## 2022-06-24 LAB — ALT: ALT: 20 U/L (ref 0–53)

## 2022-06-24 MED ORDER — EZETIMIBE 10 MG PO TABS: 10.0000 mg | ORAL_TABLET | Freq: Every day | ORAL | 3 refills | Status: DC

## 2022-06-24 NOTE — Patient Instructions (Addendum)
Vaccines I recommend:  RSV vaccine  You are overdue for a colonoscopy, please call [facility] at [phone]   Referring you to podiatry.    Check the  blood pressure regularly BP GOAL is between 110/65 and  135/85. If it is consistently higher or lower, let me know     GO TO THE LAB : Get the blood work     GO TO THE FRONT DESK, PLEASE SCHEDULE YOUR APPOINTMENTS Come back for a checkup in 4 months     Do you have a "Living will" or "Health Care Power of attorney"? (Advance care planning documents)  If you already have a living will or healthcare power of attorney, is recommended you bring the copy to be scanned in your chart. The document will be available to all the doctors you see in the system.  If you don't have one, please consider create one.   Advance directives can be documented in many types of formats,  documents have names such as:  Lliving will  Durable power of attorney for healthcare (healthcare proxy or healthcare power of attorney)  Combined directives  Physician orders for life-sustaining treatment    More information at:  [email or web address]

## 2022-06-24 NOTE — Assessment & Plan Note (Signed)
--  Td 2022 -  pnm shot 09-2014;  prevnar 12-2015; PNM 20: 2023 - s/p shingrix x 2 - covid vax : UTD - RSV d/w pt - had a flu shot  CCS:  Cscope 12-06 Dr Bosie Clos:  2 polyps Cscope again 08-2011, +polyp.  All previous referrals failed, last year tried [facility],  will try  [facility] this time Prostate cancer screening: No symptoms, PSA few months ago normal. Diet and exercise:   Labs: BMP AST ALT FLP A1c Advance directives discussed

## 2022-06-24 NOTE — Assessment & Plan Note (Signed)
Here for CPX: DM: Last A1c 7.5, at the time he was just restarted on glipizide, he also takes metformin, pioglitazone.  Recheck A1c. Onychomycosis, preulcerative calluses noted on foot exam, referred to podiatry. HTN: On HCTZ, losartan, metoprolol, BP looks good, checking labs Hyperlipidemia: Last LDL 85, very close to goal, recheck today, continue atorvastatin 80 mg, apparently not taking thus Zetia RX sent. CAD, history of CVA: At some point in Plavix, in the last few years on aspirin 81, not taking it regularly, recommend to do. RTC 4 months

## 2022-06-24 NOTE — Progress Notes (Signed)
Subjective:    Patient ID: Calvin Whidby Sr., male    DOB: 03/23/1956, 66 y.o.   MRN: [id number]  DOS:  06/24/2022 Type of visit - description: cpx  Since the last office visit is doing well. Occasionally have pain on the left flank mostly when he moves around. Denies any rash in the area.  No fever or chills.  No dysuria or gross hematuria.  No nausea vomiting.  No abdominal pain.  Also denies chest pain or difficulty breathing  Review of Systems  Other than above, a 14 point review of systems is negative     Past Medical History:  Diagnosis Date   Decreased hearing    Left   Diabetes mellitus 2009   adult onset   Glomerulonephritis 12/2008   bx provem membranous GN, sees renal q year   Grave's disease    reportedly, history of i nthe past u/s 6/11 slightly enlarged gland w/o nodules   H/O: hematuria    saw urology 3/09, neg w/u   Hyperlipemia    Hypertension    Secondary hyperparathyroidism, renal (HCC)    Dr. [name]   Stroke [facility])    Urinary retention 01/2009   was seen at the ER and followup by urology    Past Surgical History:  Procedure Laterality Date   right knee surgery     around 2015, 2021   Social History   Socioeconomic History   Marital status: Married    Spouse name: Not on file   Number of children: 3   Years of education: Not on file   Highest education level: Not on file  Occupational History   Occupation: welder  Tobacco Use   Smoking status: Former    Packs/day: 1.00    Types: Cigarettes    Quit date: 04/17/1989    Years since quitting: 33.2   Smokeless tobacco: Never  Substance and Sexual Activity   Alcohol use: No   Drug use: No   Sexual activity: Not on file  Other Topics Concern   Not on file  Social History Narrative   Original from [place], moved to [place] in the 80s , lives w/ wife   Social Determinants of Health   Financial Resource Strain: Low Risk  (11/26/2021)   Overall Financial Resource Strain (CARDIA)    Difficulty of  Paying Living Expenses: Not hard at all  Food Insecurity: No Food Insecurity (11/26/2021)   Hunger Vital Sign    Worried About Running Out of Food in the Last Year: Never true    Ran Out of Food in the Last Year: Never true  Transportation Needs: No Transportation Needs (11/26/2021)   PRAPARE - Administrator, Civil Service (Medical): No    Lack of Transportation (Non-Medical): No  Physical Activity: Sufficiently Active (11/26/2021)   Exercise Vital Sign    Days of Exercise per Week: 7 days    Minutes of Exercise per Session: 40 min  Stress: No Stress Concern Present (11/26/2021)   [employer] of Occupational Health - Occupational Stress Questionnaire    Feeling of Stress : Not at all  Social Connections: Moderately Integrated (11/26/2021)   Social Connection and Isolation Panel [NHANES]    Frequency of Communication with Friends and Family: Three times a week    Frequency of Social Gatherings with Friends and Family: Once a week    Attends Religious Services: More than 4 times per year    Active Member of Clubs or Organizations: No  Attends Archivist Meetings: Never    Marital Status: Married  Human resources officer Violence: Not At Risk (11/26/2021)   Humiliation, Afraid, Rape, and Kick questionnaire    Fear of Current or Ex-Partner: No    Emotionally Abused: No    Physically Abused: No    Sexually Abused: No    Current Outpatient Medications  Medication Instructions   aspirin EC 81 mg, Oral, Daily, Swallow whole.   atorvastatin (LIPITOR) 80 MG tablet TAKE 1 TABLET BY MOUTH EVERY DAY   EPINEPHrine (EPIPEN 2-PAK) 0.3 mg, Intramuscular, As needed   ezetimibe (ZETIA) 10 mg, Oral, Daily   fluticasone (FLONASE) 50 MCG/ACT nasal spray SPRAY 2 SPRAYS INTO EACH NOSTRIL EVERY DAY   glipiZIDE (GLUCOTROL) 5 mg, Oral, Daily before breakfast   glucose blood (FREESTYLE INSULINX TEST) test strip Test blood sugar once daily. Dx code: E11.9   hydrochlorothiazide  (HYDRODIURIL) 25 mg, Oral, Daily   Lancets (FREESTYLE) lancets Check blood sugar once daily   losartan (COZAAR) 100 MG tablet TAKE 1 TABLET BY MOUTH EVERY DAY   metFORMIN (GLUCOPHAGE) 850 mg, Oral, 2 times daily with meals   metoprolol tartrate (LOPRESSOR) 25 MG tablet TAKE 1 TABLET BY MOUTH TWICE A DAY   pioglitazone (ACTOS) 30 MG tablet TAKE 1 TABLET BY MOUTH EVERY DAY       Objective:   Physical Exam BP 138/68   Pulse 65   Temp 98 F (36.7 C) (Oral)   Resp 16   Ht 6\' 1"  (1.854 m)   Wt 239 lb (108.4 kg)   SpO2 96%   BMI 31.53 kg/m  General: Well developed, NAD, BMI noted Neck: No  thyromegaly  HEENT:  Normocephalic . Face symmetric, atraumatic Lungs:  CTA B Normal respiratory effort, no intercostal retractions, no accessory muscle use. Heart: RRR,  no murmur.  Abdomen:  Not distended, soft, non-tender. No rebound or rigidity.   DM foot exam: No edema, good pedal pulses, pinprick examination normal.  Calluses at the plantar area noted, onychomycosis Skin: Exposed areas without rash. Not pale. Not jaundice Neurologic:  alert & oriented X3.  Speech normal, gait appropriate for age and unassisted Strength symmetric and appropriate for age.  Psych: Cognition and judgment appear intact.  Cooperative with normal attention span and concentration.  Behavior appropriate. No anxious or depressed appearing.     Assessment     Assessment DM 2009 HTN Hyperlipidemia CV: CAD 01/2017: had CP,  stress test show an area of fixed defect, saw cardiology, likely s/p a out-of-hospital MI at the time of CP. Rx Plavix /metoprolol  echo 02/2017 nl EF CVA 02/2018 per MRI, MRA neck head (-), echo ok; was on ASA, was rx plavix x 3 weeks  Renal: Dr Justin Mend ---Glomerulonephritis 2010, BX proven ---Secondary hyperparathyroidism HOH L GU H/o Hematuria, urology w/u 09/2007 H/o Urinary retention 2010 BEE allergies, epipen Rx 09/2019  PLAN:  Here for CPX: DM: Last A1c 7.5, at the time he was  just restarted on glipizide, he also takes metformin, pioglitazone.  Recheck A1c. Onychomycosis, preulcerative calluses noted on foot exam, referred to podiatry. HTN: On HCTZ, losartan, metoprolol, BP looks good, checking labs Hyperlipidemia: Last LDL 85, very close to goal, recheck today, continue atorvastatin 80 mg, apparently not taking thus Zetia RX sent. CAD, history of CVA: At some point in Plavix, in the last few years on aspirin 81, not taking it regularly, recommend to do. RTC 4 months  In addition to CPX, I addressed his chronic medical problems,  I spent a significant time of time helping him with medication compliance.

## 2022-07-28 DIAGNOSIS — E119 Type 2 diabetes mellitus without complications: Secondary | ICD-10-CM | POA: Diagnosis not present

## 2022-07-28 LAB — HM DIABETES EYE EXAM

## 2022-09-04 ENCOUNTER — Other Ambulatory Visit: Payer: Self-pay | Admitting: Internal Medicine

## 2022-10-09 ENCOUNTER — Encounter: Payer: Self-pay | Admitting: Internal Medicine

## 2022-10-10 ENCOUNTER — Encounter: Payer: Self-pay | Admitting: Internal Medicine

## 2022-10-10 ENCOUNTER — Ambulatory Visit (INDEPENDENT_AMBULATORY_CARE_PROVIDER_SITE_OTHER): Payer: Medicare HMO | Admitting: Internal Medicine

## 2022-10-10 VITALS — BP 136/72 | HR 59 | Temp 97.8°F | Resp 16 | Ht 73.0 in | Wt 237.5 lb

## 2022-10-10 DIAGNOSIS — I10 Essential (primary) hypertension: Secondary | ICD-10-CM | POA: Diagnosis not present

## 2022-10-10 DIAGNOSIS — E118 Type 2 diabetes mellitus with unspecified complications: Secondary | ICD-10-CM

## 2022-10-10 DIAGNOSIS — E7800 Pure hypercholesterolemia, unspecified: Secondary | ICD-10-CM

## 2022-10-10 LAB — LIPID PANEL
Cholesterol: 164 mg/dL (ref 0–200)
HDL: 56.5 mg/dL (ref >39.00)
LDL Cholesterol: 82 mg/dL (ref 0–99)
NonHDL: 107.16
Total CHOL/HDL Ratio: 3
Triglycerides: 128 mg/dL (ref 0.0–149.0)
VLDL: 25.6 mg/dL (ref 0.0–40.0)

## 2022-10-10 LAB — AST: AST: 23 U/L (ref 0–37)

## 2022-10-10 LAB — HEMOGLOBIN A1C: Hgb A1c MFr Bld: 7 % — ABNORMAL HIGH (ref 4.6–6.5)

## 2022-10-10 LAB — MICROALBUMIN / CREATININE URINE RATIO
Creatinine,U: 71 mg/dL
Microalb Creat Ratio: 25.3 mg/g (ref 0.0–30.0)
Microalb, Ur: 18 mg/dL — ABNORMAL HIGH (ref 0.0–1.9)

## 2022-10-10 LAB — ALT: ALT: 20 U/L (ref 0–53)

## 2022-10-10 NOTE — Patient Instructions (Addendum)
Vaccines I recommend: RSV vaccine  Check the  blood pressure regularly.  Write them down keep a diary BP GOAL is between 110/65 and  135/85. If it is consistently higher or lower, let me know   GO TO THE LAB : Get the blood work     GO TO THE FRONT DESK, PLEASE SCHEDULE YOUR APPOINTMENTS Come back for   a checkup in 4 to 5 months

## 2022-10-10 NOTE — Progress Notes (Unsigned)
Subjective:    Patient ID: Calvin LimboJorge Wist Sr., male    DOB: Jan 19, 1956, 67 y.o.   MRN: [id number]  DOS:  10/10/2022 Type of visit - description: Routine follow-up  Since the last office visit is doing well. We review his chronic medical problems. He check ambulatory BPs but does not recall readings. Reports good compliance with both atorvastatin and Zetia.  Review of Systems See above   Past Medical History:  Diagnosis Date   Decreased hearing    Left   Diabetes mellitus 2009   adult onset   Glomerulonephritis 12/2008   bx provem membranous GN, sees renal q year   Grave's disease    reportedly, history of i nthe past u/s 6/11 slightly enlarged gland w/o nodules   H/O: hematuria    saw urology 3/09, neg w/u   Hyperlipemia    Hypertension    Secondary hyperparathyroidism, renal    Dr. Hyman HopesWebb   Stroke    Urinary retention 01/2009   was seen at the ER and followup by urology    Past Surgical History:  Procedure Laterality Date   right knee surgery     around 2015, 2021    Current Outpatient Medications  Medication Instructions   aspirin EC 81 mg, Oral, Daily, Swallow whole.   atorvastatin (LIPITOR) 80 MG tablet TAKE 1 TABLET BY MOUTH EVERY DAY   EPINEPHrine (EPIPEN 2-PAK) 0.3 mg, Intramuscular, As needed   ezetimibe (ZETIA) 10 mg, Oral, Daily   fluticasone (FLONASE) 50 MCG/ACT nasal spray SPRAY 2 SPRAYS INTO EACH NOSTRIL EVERY DAY   glipiZIDE (GLUCOTROL) 5 mg, Oral, Daily before breakfast   glucose blood (FREESTYLE INSULINX TEST) test strip Test blood sugar once daily. Dx code: E11.9   hydrochlorothiazide (HYDRODIURIL) 25 mg, Oral, Daily   Lancets (FREESTYLE) lancets Check blood sugar once daily   losartan (COZAAR) 100 MG tablet TAKE 1 TABLET BY MOUTH EVERY DAY   metFORMIN (GLUCOPHAGE) 850 mg, Oral, 2 times daily with meals   metoprolol tartrate (LOPRESSOR) 25 MG tablet TAKE 1 TABLET BY MOUTH TWICE A DAY   pioglitazone (ACTOS) 30 MG tablet TAKE 1 TABLET BY MOUTH EVERY  DAY       Objective:   Physical Exam BP 136/72   Pulse (!) 59   Temp 97.8 F (36.6 C) (Oral)   Resp 16   Ht 6\' 1"  (1.854 m)   Wt 237 lb 8 oz (107.7 kg)   SpO2 97%   BMI 31.33 kg/m  General:   Well developed, NAD, BMI noted. HEENT:  Normocephalic . Face symmetric, atraumatic Lungs:  CTA B Normal respiratory effort, no intercostal retractions, no accessory muscle use. Heart: RRR,  no murmur.  Lower extremities: no pretibial edema bilaterally  Skin: Not pale. Not jaundice Neurologic:  alert & oriented X3.  Speech normal, gait appropriate for age and unassisted Psych--  Cognition and judgment appear intact.  Cooperative with normal attention span and concentration.  Behavior appropriate. No anxious or depressed appearing.      Assessment    Assessment DM 2009 HTN Hyperlipidemia CV: CAD:  01/2017: had CP,  stress test show an area of fixed defect, saw cardiology, likely s/p a out-of-hospital MI . Rx Plavix    CVA 02/2018 per MRI, MRA neck head (-), echo ok; was on ASA, was rx plavix x 3 weeks  Renal: Dr Hyman HopesWebb ---Glomerulonephritis 2010, BX proven ---Secondary hyperparathyroidism HOH L GU H/o Hematuria, urology w/u 09/2007 H/o Urinary retention 2010 BEE allergies, epipen Rx 09/2019  PLAN:  DM: On glipizide, metformin, pioglitazone, check A1c and micro. HTN: Occasionally check BPs, he said they are okay but does not recall the readings.  Encouraged to make a log.  Continue HCTZ, losartan, metoprolol.  Last BMP okay. Hyperlipidemia: LDL goal 70 or less.  Not at goal currently.  Reports good compliance with atorvastatin 80 mg and Zetia.  Check FLP AST ALT. 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  Pt is very happy about it. RTC 4 to 5 months

## 2022-10-11 NOTE — Assessment & Plan Note (Addendum)
DM: On glipizide, metformin, pioglitazone, check A1c and micro. HTN: Occasionally check BPs, he said they are okay but does not recall the readings.  Encouraged to make a log.  Continue HCTZ, losartan, metoprolol.  Last BMP okay. Hyperlipidemia: LDL goal 70 or less.  Not at goal currently.  Reports good compliance with atorvastatin 80 mg and Zetia.  Check FLP AST ALT. 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  Pt is very happy about it. RTC 4 to 5 months

## 2022-10-14 MED ORDER — PIOGLITAZONE HCL 45 MG PO TABS: 45.0000 mg | ORAL_TABLET | Freq: Every day | ORAL | 1 refills | Status: DC

## 2022-10-14 NOTE — Addendum Note (Signed)
Addended by: Wilford Corner on: 10/14/2022 08:30 AM   Modules accepted: Orders

## 2022-10-17 ENCOUNTER — Encounter: Payer: Self-pay | Admitting: Internal Medicine

## 2022-11-28 ENCOUNTER — Ambulatory Visit: Payer: Medicare HMO

## 2022-11-28 DIAGNOSIS — R809 Proteinuria, unspecified: Secondary | ICD-10-CM | POA: Diagnosis not present

## 2022-11-28 DIAGNOSIS — I129 Hypertensive chronic kidney disease with stage 1 through stage 4 chronic kidney disease, or unspecified chronic kidney disease: Secondary | ICD-10-CM | POA: Diagnosis not present

## 2022-12-05 ENCOUNTER — Ambulatory Visit: Payer: Medicare HMO

## 2022-12-05 DIAGNOSIS — R809 Proteinuria, unspecified: Secondary | ICD-10-CM | POA: Diagnosis not present

## 2022-12-05 DIAGNOSIS — N022 Recurrent and persistent hematuria with diffuse membranous glomerulonephritis: Secondary | ICD-10-CM | POA: Diagnosis not present

## 2022-12-05 DIAGNOSIS — E876 Hypokalemia: Secondary | ICD-10-CM | POA: Diagnosis not present

## 2022-12-05 DIAGNOSIS — N181 Chronic kidney disease, stage 1: Secondary | ICD-10-CM | POA: Diagnosis not present

## 2022-12-05 DIAGNOSIS — E1122 Type 2 diabetes mellitus with diabetic chronic kidney disease: Secondary | ICD-10-CM | POA: Diagnosis not present

## 2022-12-05 DIAGNOSIS — I129 Hypertensive chronic kidney disease with stage 1 through stage 4 chronic kidney disease, or unspecified chronic kidney disease: Secondary | ICD-10-CM | POA: Diagnosis not present

## 2022-12-12 ENCOUNTER — Ambulatory Visit (INDEPENDENT_AMBULATORY_CARE_PROVIDER_SITE_OTHER): Payer: Medicare HMO | Admitting: *Deleted

## 2022-12-12 ENCOUNTER — Other Ambulatory Visit: Payer: Self-pay | Admitting: Internal Medicine

## 2022-12-12 VITALS — BP 139/77 | HR 58 | Ht 73.0 in | Wt 238.8 lb

## 2022-12-12 DIAGNOSIS — Z0000 Encounter for general adult medical examination without abnormal findings: Secondary | ICD-10-CM | POA: Diagnosis not present

## 2022-12-12 DIAGNOSIS — E118 Type 2 diabetes mellitus with unspecified complications: Secondary | ICD-10-CM

## 2022-12-12 NOTE — Patient Instructions (Signed)
[name] , Thank you for taking time to come for your Medicare Wellness Visit. I appreciate your ongoing commitment to your health goals. Please review the following plan we discussed and let me know if I can assist you in the future.   These are the goals we discussed:  Goals   None     This is a list of the screening recommended for you and due dates:  Health Maintenance  Topic Date Due   Colon Cancer Screening  08/14/2016   COVID-19 Vaccine (6 - 2023-24 season) 12/24/2022*   Flu Shot  02/05/2023   Hemoglobin A1C  04/11/2023   Yearly kidney function blood test for diabetes  06/25/2023   Complete foot exam   06/25/2023   Eye exam for diabetics  07/29/2023   Yearly kidney health urinalysis for diabetes  10/10/2023   Medicare Annual Wellness Visit  12/12/2023   DTaP/Tdap/Td vaccine (3 - Td or Tdap) 02/15/2031   Pneumonia Vaccine  Completed   Hepatitis C Screening  Completed   Zoster (Shingles) Vaccine  Completed   HPV Vaccine  Aged Out  *Topic was postponed. The date shown is not the original due date.    Next appointment: Follow up in one year for your annual wellness visit.   Preventive Care 82 Years and Older, Male Preventive care refers to lifestyle choices and visits with your health care provider that can promote health and wellness. What does preventive care include? A yearly physical exam. This is also called an annual well check. Dental exams once or twice a year. Routine eye exams. Ask your health care provider how often you should have your eyes checked. Personal lifestyle choices, including: Daily care of your teeth and gums. Regular physical activity. Eating a healthy diet. Avoiding tobacco and drug use. Limiting alcohol use. Practicing safe sex. Taking low doses of aspirin every day. Taking vitamin and mineral supplements as recommended by your health care provider. What happens during an annual well check? The services and screenings done by your  health care provider during your annual well check will depend on your age, overall health, lifestyle risk factors, and family history of disease. Counseling  Your health care provider may ask you questions about your: Alcohol use. Tobacco use. Drug use. Emotional well-being. Home and relationship well-being. Sexual activity. Eating habits. History of falls. Memory and ability to understand (cognition). Work and work Astronomer. Screening  You may have the following tests or measurements: Height, weight, and BMI. Blood pressure. Lipid and cholesterol levels. These may be checked every 5 years, or more frequently if you are over 15 years old. Skin check. Lung cancer screening. You may have this screening every year starting at age 76 if you have a 30-pack-year history of smoking and currently smoke or have quit within the past 15 years. Fecal occult blood test (FOBT) of the stool. You may have this test every year starting at age 89. Flexible sigmoidoscopy or colonoscopy. You may have a sigmoidoscopy every 5 years or a colonoscopy every 10 years starting at age 39. Prostate cancer screening. Recommendations will vary depending on your family history and other risks. Hepatitis C blood test. Hepatitis B blood test. Sexually transmitted disease (STD) testing. Diabetes screening. This is done by checking your blood sugar (glucose) after you have not eaten for a while (fasting). You may have this done every 1-3 years. Abdominal aortic aneurysm (AAA) screening. You may need this if you are a current or former smoker. Osteoporosis. You  may be screened starting at age 26 if you are at high risk. Talk with your health care provider about your test results, treatment options, and if necessary, the need for more tests. Vaccines  Your health care provider may recommend certain vaccines, such as: Influenza vaccine. This is recommended every year. Tetanus, diphtheria, and acellular pertussis  (Tdap, Td) vaccine. You may need a Td booster every 10 years. Zoster vaccine. You may need this after age 72. Pneumococcal 13-valent conjugate (PCV13) vaccine. One dose is recommended after age 90. Pneumococcal polysaccharide (PPSV23) vaccine. One dose is recommended after age 75. Talk to your health care provider about which screenings and vaccines you need and how often you need them. This information is not intended to replace advice given to you by your health care provider. Make sure you discuss any questions you have with your health care provider. Document Released: 07/20/2015 Document Revised: 03/12/2016 Document Reviewed: 04/24/2015 Elsevier Interactive Patient Education  2017 [place] Prevention in the Home Falls can cause injuries. They can happen to people of all ages. There are many things you can do to make your home safe and to help prevent falls. What can I do on the outside of my home? Regularly fix the edges of walkways and driveways and fix any cracks. Remove anything that might make you trip as you walk through a door, such as a raised step or threshold. Trim any bushes or trees on the path to your home. Use bright outdoor lighting. Clear any walking paths of anything that might make someone trip, such as rocks or tools. Regularly check to see if handrails are loose or broken. Make sure that both sides of any steps have handrails. Any raised decks and porches should have guardrails on the edges. Have any leaves, snow, or ice cleared regularly. Use sand or salt on walking paths during winter. Clean up any spills in your garage right away. This includes oil or grease spills. What can I do in the bathroom? Use night lights. Install grab bars by the toilet and in the tub and shower. Do not use towel bars as grab bars. Use non-skid mats or decals in the tub or shower. If you need to sit down in the shower, use a plastic, non-slip stool. Keep the floor dry. Clean  up any water that spills on the floor as soon as it happens. Remove soap buildup in the tub or shower regularly. Attach bath mats securely with double-sided non-slip rug tape. Do not have throw rugs and other things on the floor that can make you trip. What can I do in the bedroom? Use night lights. Make sure that you have a light by your bed that is easy to reach. Do not use any sheets or blankets that are too big for your bed. They should not hang down onto the floor. Have a firm chair that has side arms. You can use this for support while you get dressed. Do not have throw rugs and other things on the floor that can make you trip. What can I do in the kitchen? Clean up any spills right away. Avoid walking on wet floors. Keep items that you use a lot in easy-to-reach places. If you need to reach something above you, use a strong step stool that has a grab bar. Keep electrical cords out of the way. Do not use floor polish or wax that makes floors slippery. If you must use wax, use non-skid floor wax. Do  not have throw rugs and other things on the floor that can make you trip. What can I do with my stairs? Do not leave any items on the stairs. Make sure that there are handrails on both sides of the stairs and use them. Fix handrails that are broken or loose. Make sure that handrails are as long as the stairways. Check any carpeting to make sure that it is firmly attached to the stairs. Fix any carpet that is loose or worn. Avoid having throw rugs at the top or bottom of the stairs. If you do have throw rugs, attach them to the floor with carpet tape. Make sure that you have a light switch at the top of the stairs and the bottom of the stairs. If you do not have them, ask someone to add them for you. What else can I do to help prevent falls? Wear shoes that: Do not have high heels. Have rubber bottoms. Are comfortable and fit you well. Are closed at the toe. Do not wear sandals. If you  use a stepladder: Make sure that it is fully opened. Do not climb a closed stepladder. Make sure that both sides of the stepladder are locked into place. Ask someone to hold it for you, if possible. Clearly mark and make sure that you can see: Any grab bars or handrails. First and last steps. Where the edge of each step is. Use tools that help you move around (mobility aids) if they are needed. These include: Canes. Walkers. Scooters. Crutches. Turn on the lights when you go into a dark area. Replace any light bulbs as soon as they burn out. Set up your furniture so you have a clear path. Avoid moving your furniture around. If any of your floors are uneven, fix them. If there are any pets around you, be aware of where they are. Review your medicines with your doctor. Some medicines can make you feel dizzy. This can increase your chance of falling. Ask your doctor what other things that you can do to help prevent falls. This information is not intended to replace advice given to you by your health care provider. Make sure you discuss any questions you have with your health care provider. Document Released: 04/19/2009 Document Revised: 11/29/2015 Document Reviewed: 07/28/2014 Elsevier Interactive Patient Education  2017 [employer].

## 2022-12-12 NOTE — Progress Notes (Signed)
Subjective:   Calvin Imgrund Sr. is a 67 y.o. male who presents for Medicare Annual/Subsequent preventive examination.  Review of Systems     Cardiac Risk Factors include: advanced age (>45men, >23 women);obesity (BMI >30kg/m2);hypertension;diabetes mellitus;dyslipidemia;male gender     Objective:    Today's Vitals   12/12/22 0823  BP: 139/77  Pulse: (!) 58  Weight: 238 lb 12.8 oz (108.3 kg)  Height: 6\' 1"  (1.854 m)   Body mass index is 31.51 kg/m.     12/12/2022    8:26 AM 11/26/2021    8:27 AM 02/13/2018    3:21 PM 02/12/2018   10:12 PM  Advanced Directives  Does Patient Have a Medical Advance Directive? No No No No  Would patient like information on creating a medical advance directive? No - Patient declined No - Patient declined No - Patient declined     Current Medications (verified) Outpatient Encounter Medications as of 12/12/2022  Medication Sig   aspirin EC 81 MG tablet Take 81 mg by mouth daily. Swallow whole.   atorvastatin (LIPITOR) 80 MG tablet TAKE 1 TABLET BY MOUTH EVERY DAY   EPINEPHrine (EPIPEN 2-PAK) 0.3 mg/0.3 mL IJ SOAJ injection Inject 0.3 mg into the muscle as needed for anaphylaxis. (Patient not taking: Reported on 06/24/2022)   ezetimibe (ZETIA) 10 MG tablet Take 1 tablet (10 mg total) by mouth daily.   fluticasone (FLONASE) 50 MCG/ACT nasal spray SPRAY 2 SPRAYS INTO EACH NOSTRIL EVERY DAY (Patient not taking: Reported on 06/24/2022)   glipiZIDE (GLUCOTROL) 5 MG tablet Take 1 tablet (5 mg total) by mouth daily before breakfast. (Patient not taking: Reported on 12/12/2022)   glucose blood (FREESTYLE INSULINX TEST) test strip Test blood sugar once daily. Dx code: E11.9   hydrochlorothiazide (HYDRODIURIL) 25 MG tablet TAKE 1 TABLET BY MOUTH EVERY DAY   Lancets (FREESTYLE) lancets Check blood sugar once daily   losartan (COZAAR) 100 MG tablet TAKE 1 TABLET BY MOUTH EVERY DAY   metFORMIN (GLUCOPHAGE) 850 MG tablet TAKE 1 TABLET (850 MG TOTAL) BY MOUTH 2 (TWO)  TIMES DAILY WITH A MEAL.   metoprolol tartrate (LOPRESSOR) 25 MG tablet TAKE 1 TABLET BY MOUTH TWICE A DAY   pioglitazone (ACTOS) 45 MG tablet Take 1 tablet (45 mg total) by mouth daily.   No facility-administered encounter medications on file as of 12/12/2022.    Allergies (verified) Bee venom and Penicillins   History: Past Medical History:  Diagnosis Date   Decreased hearing    Left   Diabetes mellitus 2009   adult onset   Glomerulonephritis 12/2008   bx provem membranous GN, sees renal q year   Grave's disease    reportedly, history of i nthe past u/s 6/11 slightly enlarged gland w/o nodules   H/O: hematuria    saw urology 3/09, neg w/u   Hyperlipemia    Hypertension    Secondary hyperparathyroidism, renal (HCC)    Dr. [name]   Stroke [facility])    Urinary retention 01/2009   was seen at the ER and followup by urology   Past Surgical History:  Procedure Laterality Date   right knee surgery     around 2015, 2021   Family History  Problem Relation Age of Onset   Diabetes Other        GM   Diabetes Maternal Grandmother    Hypertension Maternal Grandmother    Healthy Mother    Heart attack Neg Hx    Colon cancer Neg Hx  Prostate cancer Neg Hx    Social History   Socioeconomic History   Marital status: Married    Spouse name: Not on file   Number of children: 3   Years of education: Not on file   Highest education level: Not on file  Occupational History   Occupation: welder  Tobacco Use   Smoking status: Former    Packs/day: 1    Types: Cigarettes    Quit date: 04/17/1989    Years since quitting: 33.6   Smokeless tobacco: Never  Substance and Sexual Activity   Alcohol use: No   Drug use: No   Sexual activity: Not on file  Other Topics Concern   Not on file  Social History Narrative   Original from [place], moved to [place] in the 80s , lives w/ wife   Social Determinants of Health   Financial Resource Strain: Low Risk  (11/26/2021)   Overall Financial  Resource Strain (CARDIA)    Difficulty of Paying Living Expenses: Not hard at all  Food Insecurity: No Food Insecurity (12/12/2022)   Hunger Vital Sign    Worried About Running Out of Food in the Last Year: Never true    Ran Out of Food in the Last Year: Never true  Transportation Needs: No Transportation Needs (12/12/2022)   PRAPARE - Administrator, Civil Service (Medical): No    Lack of Transportation (Non-Medical): No  Physical Activity: Sufficiently Active (11/26/2021)   Exercise Vital Sign    Days of Exercise per Week: 7 days    Minutes of Exercise per Session: 40 min  Stress: No Stress Concern Present (11/26/2021)   [employer] of Occupational Health - Occupational Stress Questionnaire    Feeling of Stress : Not at all  Social Connections: Moderately Integrated (11/26/2021)   Social Connection and Isolation Panel [NHANES]    Frequency of Communication with Friends and Family: Three times a week    Frequency of Social Gatherings with Friends and Family: Once a week    Attends Religious Services: More than 4 times per year    Active Member of [employer] or Organizations: No    Attends Engineer, structural: Never    Marital Status: Married    Tobacco Counseling Counseling given: Not Answered   Clinical Intake:  Pre-visit preparation completed: Yes  Pain : No/denies pain     BMI - recorded: 31.51 Nutritional Status: BMI > 30  Obese Nutritional Risks: None Diabetes: Yes CBG done?: No Did pt. bring in CBG monitor from home?: No  How often do you need to have someone help you when you read instructions, pamphlets, or other written materials from your doctor or pharmacy?: 1 - Never  Activities of Daily Living    12/12/2022    8:28 AM  In your present state of health, do you have any difficulty performing the following activities:  Hearing? 1  Comment hearing loss in left ear  Vision? 0  Difficulty concentrating or making decisions? 0  Walking or  climbing stairs? 0  Dressing or bathing? 0  Doing errands, shopping? 0  Preparing Food and eating ? N  Using the Toilet? N  In the past six months, have you accidently leaked urine? N  Do you have problems with loss of bowel control? N  Managing your Medications? N  Managing your Finances? N  Housekeeping or managing your Housekeeping? N    Patient Care Team: [name], MD as PCP - [facility] [place], [place],  MD as PCP - Cardiology (Cardiology) Elvis Coil, MD as Consulting Physician (Nephrology) Charlott Rakes, MD as Consulting Physician (Gastroenterology) [name], MD as Consulting Physician (Orthopedic Surgery) Daisy Lazar, DO (Optometry)  Indicate any recent Medical Services you may have received from other than [facility] providers in the past year (date may be approximate).     Assessment:   This is a routine wellness examination for Kayvan.  Hearing/Vision screen No results found.  Dietary issues and exercise activities discussed: Current Exercise Habits: Home exercise routine, Type of exercise: walking, Time (Minutes): 25, Frequency (Times/Week): 7, Weekly Exercise (Minutes/Week): 175, Intensity: Mild, Exercise limited by: None identified   Goals Addressed   None    Depression Screen    12/12/2022    8:28 AM 10/10/2022    8:39 AM 06/24/2022    9:18 AM 11/26/2021    8:28 AM 07/23/2021    9:23 AM 09/21/2020    8:00 AM 09/30/2019    8:55 AM  PHQ 2/9 Scores  PHQ - 2 Score 0 0 0 0 0 0 0    Fall Risk    12/12/2022    8:26 AM 10/10/2022    8:39 AM 06/24/2022    9:17 AM 11/26/2021    8:27 AM 07/23/2021    9:23 AM  Fall Risk   Falls in the past year? 0 0 0 0 0  Number falls in past yr: 0 0 0 0 0  Injury with Fall? 0 0 0 0 0  Risk for fall due to : No Fall Risks   No Fall Risks   Follow up Falls evaluation completed Falls evaluation completed Falls evaluation completed Falls evaluation completed Falls evaluation completed    FALL RISK PREVENTION PERTAINING TO THE  HOME:  Any stairs in or around the home? No  Home free of loose throw rugs in walkways, pet beds, electrical cords, etc? Yes  Adequate lighting in your home to reduce risk of falls? Yes   ASSISTIVE DEVICES UTILIZED TO PREVENT FALLS:  Life alert? No  Use of a cane, walker or w/c? No  Grab bars in the bathroom? Yes  Shower chair or bench in shower? No  Elevated toilet seat or a handicapped toilet? No   TIMED UP AND GO:  Was the test performed? Yes .  Length of time to ambulate 10 feet: 6 sec.   Gait steady and fast without use of assistive device  Cognitive Function:        12/12/2022    8:34 AM 11/26/2021    8:34 AM  6CIT Screen  What Year? 0 points 0 points  What month? 0 points 0 points  What time? 0 points 0 points  Count back from 20 0 points 0 points  Months in reverse 2 points 4 points  Repeat phrase 0 points 0 points  Total Score 2 points 4 points    Immunizations Immunization History  Administered Date(s) Administered   Fluad Quad(high Dose 65+) 05/11/2020, 03/24/2022   Influenza Split 04/18/2011   Influenza Whole 05/07/2010   Influenza, High Dose Seasonal PF 04/08/2021   Influenza,inj,Quad PF,6+ Mos 04/08/2013, 05/26/2014, 04/03/2015, 06/20/2016, 05/01/2017   Influenza-Unspecified 04/23/2018, 04/01/2019   PFIZER Comirnaty(Gray Top)Covid-19 Tri-Sucrose Vaccine 12/28/2020   PFIZER(Purple Top)SARS-COV-2 Vaccination 08/11/2019, 09/05/2019, 07/13/2020   PNEUMOCOCCAL CONJUGATE-20 11/20/2021   Pfizer Covid-19 Vaccine Bivalent Booster 72yrs & up 04/09/2022   Pneumococcal Conjugate-13 12/21/2015   Pneumococcal Polysaccharide-23 09/22/2014   Td 11/20/2009   Tdap 02/14/2021   Zoster Recombinat (Shingrix) 08/20/2018,  10/22/2018    TDAP status: Up to date  Flu Vaccine status: Up to date  Pneumococcal vaccine status: Up to date  Covid-19 vaccine status: Information provided on how to obtain vaccines.   Qualifies for Shingles Vaccine? Yes   Zostavax completed  No   Shingrix Completed?: Yes  Screening Tests Health Maintenance  Topic Date Due   Colonoscopy  08/14/2016   Medicare Annual Wellness (AWV)  11/27/2022   COVID-19 Vaccine (6 - 2023-24 season) 12/24/2022 (Originally 06/04/2022)   INFLUENZA VACCINE  02/05/2023   HEMOGLOBIN A1C  04/11/2023   Diabetic kidney evaluation - eGFR measurement  06/25/2023   FOOT EXAM  06/25/2023   OPHTHALMOLOGY EXAM  07/29/2023   Diabetic kidney evaluation - Urine ACR  10/10/2023   DTaP/Tdap/Td (3 - Td or Tdap) 02/15/2031   Pneumonia Vaccine 10+ Years old  Completed   Hepatitis C Screening  Completed   Zoster Vaccines- Shingrix  Completed   HPV VACCINES  Aged Out    Health Maintenance  Health Maintenance Due  Topic Date Due   Colonoscopy  08/14/2016   Medicare Annual Wellness (AWV)  11/27/2022    Colorectal cancer screening: Referral to GI placed 06/24/22. Pt aware the office will call re: appt.  Lung Cancer Screening: (Low Dose CT Chest recommended if Age 66-80 years, 30 pack-year currently smoking OR have quit w/in 15years.) does not qualify.    Additional Screening:  Hepatitis C Screening: does qualify; Completed 03/02/15  Vision Screening: Recommended annual ophthalmology exams for early detection of glaucoma and other disorders of the eye. Is the patient up to date with their annual eye exam?  Yes  Who is the provider or what is the name of the office in which the patient attends annual eye exams? [facility] If pt is not established with a provider, would they like to be referred to a provider to establish care? No .   Dental Screening: Recommended annual dental exams for proper oral hygiene  Community Resource Referral / Chronic Care Management: CRR required this visit?  No   CCM required this visit?  No      Plan:     I have personally reviewed and noted the following in the patient's chart:   Medical and social history Use of alcohol, tobacco or illicit drugs  Current  medications and supplements including opioid prescriptions. Patient is not currently taking opioid prescriptions. Functional ability and status Nutritional status Physical activity Advanced directives List of other physicians Hospitalizations, surgeries, and ER visits in previous 12 months Vitals Screenings to include cognitive, depression, and falls Referrals and appointments  In addition, I have reviewed and discussed with patient certain preventive protocols, quality metrics, and best practice recommendations. A written personalized care plan for preventive services as well as general preventive health recommendations were provided to patient.     Donne Anon, [place]   12/12/2022   Nurse Notes: None

## 2023-01-14 ENCOUNTER — Telehealth: Payer: Self-pay

## 2023-01-14 NOTE — Progress Notes (Signed)
[facility] Customer service manager [facility]) Quality Pharmacy Team [facility]   01/14/2023  Calvin Vannote Sr. [birth year] [id number]  Reason for referral: Medication Adherence  Referral source:  [employer] payor data  Current insurance: Monia Pouch  Reason for call: Atorvastatin prescription is expired and patient was outreached to discuss compliance as medication has not been filled since last year. I called patient but was unable to reach him; left a message (via interpreter) asking to return my call.   Outreach:  Unsuccessful telephone call attempt #3 to patient.   HIPAA compliant voicemail left requesting a return call  Plan:  -I will make another outreach attempt to patient in 7-10.  Thank you for allowing [facility] pharmacy to be a part of this patient's care. Cephus Shelling, PharmD Clinical Pharmacist [facility] Cell: [phone]

## 2023-01-23 DIAGNOSIS — K635 Polyp of colon: Secondary | ICD-10-CM | POA: Diagnosis not present

## 2023-01-23 DIAGNOSIS — Z09 Encounter for follow-up examination after completed treatment for conditions other than malignant neoplasm: Secondary | ICD-10-CM | POA: Diagnosis not present

## 2023-01-23 DIAGNOSIS — K648 Other hemorrhoids: Secondary | ICD-10-CM | POA: Diagnosis not present

## 2023-01-23 DIAGNOSIS — Z86010 Personal history of colonic polyps: Secondary | ICD-10-CM | POA: Diagnosis not present

## 2023-01-23 DIAGNOSIS — K5140 Inflammatory polyps of colon without complications: Secondary | ICD-10-CM | POA: Diagnosis not present

## 2023-01-27 DIAGNOSIS — K5140 Inflammatory polyps of colon without complications: Secondary | ICD-10-CM | POA: Diagnosis not present

## 2023-01-27 DIAGNOSIS — K635 Polyp of colon: Secondary | ICD-10-CM | POA: Diagnosis not present

## 2023-02-19 ENCOUNTER — Encounter (INDEPENDENT_AMBULATORY_CARE_PROVIDER_SITE_OTHER): Payer: Self-pay

## 2023-02-25 ENCOUNTER — Encounter: Payer: Self-pay | Admitting: Internal Medicine

## 2023-03-13 ENCOUNTER — Encounter: Payer: Self-pay | Admitting: Internal Medicine

## 2023-03-13 ENCOUNTER — Ambulatory Visit (INDEPENDENT_AMBULATORY_CARE_PROVIDER_SITE_OTHER): Payer: Medicare HMO | Admitting: Internal Medicine

## 2023-03-13 VITALS — BP 122/82 | HR 56 | Temp 98.4°F | Resp 16 | Ht 73.0 in | Wt 232.5 lb

## 2023-03-13 DIAGNOSIS — E7800 Pure hypercholesterolemia, unspecified: Secondary | ICD-10-CM

## 2023-03-13 DIAGNOSIS — E118 Type 2 diabetes mellitus with unspecified complications: Secondary | ICD-10-CM | POA: Diagnosis not present

## 2023-03-13 DIAGNOSIS — Z7984 Long term (current) use of oral hypoglycemic drugs: Secondary | ICD-10-CM

## 2023-03-13 DIAGNOSIS — I10 Essential (primary) hypertension: Secondary | ICD-10-CM | POA: Diagnosis not present

## 2023-03-13 LAB — CBC WITH DIFFERENTIAL/PLATELET
Basophils Absolute: 0 10*3/uL (ref 0.0–0.1)
Basophils Relative: 0.6 % (ref 0.0–3.0)
Eosinophils Absolute: 0.1 10*3/uL (ref 0.0–0.7)
Eosinophils Relative: 1.7 % (ref 0.0–5.0)
HCT: 43.1 % (ref 39.0–52.0)
Hemoglobin: 14.3 g/dL (ref 13.0–17.0)
Lymphocytes Relative: 25.2 % (ref 12.0–46.0)
Lymphs Abs: 1.1 10*3/uL (ref 0.7–4.0)
MCHC: 33.3 g/dL (ref 30.0–36.0)
MCV: 89.8 fl (ref 78.0–100.0)
Monocytes Absolute: 0.4 10*3/uL (ref 0.1–1.0)
Monocytes Relative: 8.1 % (ref 3.0–12.0)
Neutro Abs: 2.9 10*3/uL (ref 1.4–7.7)
Neutrophils Relative %: 64.4 % (ref 43.0–77.0)
Platelets: 189 10*3/uL (ref 150.0–400.0)
RBC: 4.79 Mil/uL (ref 4.22–5.81)
RDW: 14.8 % (ref 11.5–15.5)
WBC: 4.5 10*3/uL (ref 4.0–10.5)

## 2023-03-13 LAB — BASIC METABOLIC PANEL
BUN: 21 mg/dL (ref 6–23)
CO2: 32 meq/L (ref 19–32)
Calcium: 9.5 mg/dL (ref 8.4–10.5)
Chloride: 99 meq/L (ref 96–112)
Creatinine, Ser: 1.32 mg/dL (ref 0.40–1.50)
GFR: 55.41 mL/min — ABNORMAL LOW (ref >60.00)
Glucose, Bld: 114 mg/dL — ABNORMAL HIGH (ref 70–99)
Potassium: 4 meq/L (ref 3.5–5.1)
Sodium: 137 meq/L (ref 135–145)

## 2023-03-13 LAB — HEMOGLOBIN A1C: Hgb A1c MFr Bld: 8.2 % — ABNORMAL HIGH (ref 4.6–6.5)

## 2023-03-13 NOTE — Assessment & Plan Note (Signed)
DM:  Last A1c 7.0  thus actos increased to 45 mg daily.  Subsequently saw nephrology, amb CBGs were 40, 50 with symptoms, they rec to stop glipizide but continue metformin and Actos. Now CBGs in the 100s. Feet exam negative. Plan: Check A1c.  Further advised with results HTN: KCl was added by nephrology a few months ago.  Reports normal ambulatory BPs.  Plan: Continue Losartan, metoprolol, HCTZ, KCl.  Check BMP High cholesterol: On atorvastatin 80 and Zetia.  Last LDL 82.  LDL goal: Less than 70.  For now we will continue with high-dose statins and Zetia. Vaccine advised provided. RTC 06/2023 for CPX

## 2023-03-13 NOTE — Progress Notes (Signed)
Subjective:    Patient ID: Calvin Weinmann Sr., male    DOB: 05/16/1956, 67 y.o.   MRN: [id number]  DOS:  03/13/2023 Type of visit - description: Follow-up  Chronic medical problems addressed. Feeling well. Saw nephrology, notes reviewed.  Was advised to stop glipizide due to low sugars.  They added KCl. Reports normal ambulatory BPs. Reports no lower extremity paresthesias  Review of Systems See above   Past Medical History:  Diagnosis Date   Decreased hearing    Left   Diabetes mellitus 2009   adult onset   Glomerulonephritis 12/2008   bx provem membranous GN, sees renal q year   Grave's disease    reportedly, history of i nthe past u/s 6/11 slightly enlarged gland w/o nodules   H/O: hematuria    saw urology 3/09, neg w/u   Hyperlipemia    Hypertension    Secondary hyperparathyroidism, renal (HCC)    Dr. [name]   Stroke [facility])    Urinary retention 01/2009   was seen at the ER and followup by urology    Past Surgical History:  Procedure Laterality Date   right knee surgery     around 2015, 2021    Current Outpatient Medications  Medication Instructions   aspirin EC 81 mg, Oral, Daily, Swallow whole.   atorvastatin (LIPITOR) 80 MG tablet TAKE 1 TABLET BY MOUTH EVERY DAY   EPINEPHrine (EPIPEN 2-PAK) 0.3 mg, Intramuscular, As needed   ezetimibe (ZETIA) 10 mg, Oral, Daily   fluticasone (FLONASE) 50 MCG/ACT nasal spray SPRAY 2 SPRAYS INTO EACH NOSTRIL EVERY DAY   glucose blood (FREESTYLE INSULINX TEST) test strip Test blood sugar once daily. Dx code: E11.9   hydrochlorothiazide (HYDRODIURIL) 25 mg, Oral, Daily   Lancets (FREESTYLE) lancets Check blood sugar once daily   losartan (COZAAR) 100 MG tablet TAKE 1 TABLET BY MOUTH EVERY DAY   metFORMIN (GLUCOPHAGE) 850 mg, Oral, 2 times daily with meals   metoprolol tartrate (LOPRESSOR) 25 mg, Oral, 2 times daily   pioglitazone (ACTOS) 45 mg, Oral, Daily   potassium chloride (MICRO-K) 10 MEQ CR capsule 10 mEq, Oral, Daily        Objective:   Physical Exam BP 122/82   Pulse (!) 56   Temp 98.4 F (36.9 C) (Oral)   Resp 16   Ht 6\' 1"  (1.854 m)   Wt 232 lb 8 oz (105.5 kg)   SpO2 96%   BMI 30.67 kg/m  General:   Well developed, NAD, BMI noted. HEENT:  Normocephalic . Face symmetric, atraumatic Lungs:  CTA B Normal respiratory effort, no intercostal retractions, no accessory muscle use. Heart: RRR,  no murmur.  DM foot exam: No edema, good pedal pulses, pinprick examination normal Skin: Not pale. Not jaundice Neurologic:  alert & oriented X3.  Speech normal, gait appropriate for age and unassisted Psych--  Cognition and judgment appear intact.  Cooperative with normal attention span and concentration.  Behavior appropriate. No anxious or depressed appearing.      Assessment     Assessment DM 2009 HTN Hyperlipidemia CV: CAD:  01/2017: had CP,  stress test show an area of fixed defect, saw cardiology, likely s/p a out-of-hospital MI . Rx Plavix    CVA 02/2018 per MRI, MRA neck head (-), echo ok; was on ASA, was rx plavix x 3 weeks  Renal: Dr [name] ---Glomerulonephritis 2010, BX proven ---Secondary hyperparathyroidism HOH L GU H/o Hematuria, urology w/u 09/2007 H/o Urinary retention 2010 BEE allergies, epipen Rx 09/2019  PLAN:  DM:  Last A1c 7.0  thus actos increased to 45 mg daily.  Subsequently saw nephrology, amb CBGs were 40, 50 with symptoms, they rec to stop glipizide but continue metformin and Actos. Now CBGs in the 100s. Feet exam negative. Plan: Check A1c.  Further advised with results HTN: KCl was added by nephrology a few months ago.  Reports normal ambulatory BPs.  Plan: Continue Losartan, metoprolol, HCTZ, KCl.  Check BMP High cholesterol: On atorvastatin 80 and Zetia.  Last LDL 82.  LDL goal: Less than 70.  For now we will continue with high-dose statins and Zetia. Vaccine advised provided. RTC 06/2023 for CPX

## 2023-03-13 NOTE — Patient Instructions (Addendum)
Vaccines I recommend: Covid booster-new this fall Flu shot this fall RSV vaccine   Check the  blood pressure regularly Blood pressure goal:  between 110/65 and  135/85. If it is consistently higher or lower, let me know    Diabetes, blood sugar goals: - early in AM fasting  ( blood sugar goal 80-130) - 2 hours after a meal (blood sugar goal less than 180)    GO TO THE LAB : Get the blood work     GO TO THE FRONT DESK, PLEASE SCHEDULE YOUR APPOINTMENTS Come back for   for a physical exam by December

## 2023-03-17 ENCOUNTER — Other Ambulatory Visit: Payer: Self-pay | Admitting: Internal Medicine

## 2023-03-17 DIAGNOSIS — E118 Type 2 diabetes mellitus with unspecified complications: Secondary | ICD-10-CM

## 2023-03-18 ENCOUNTER — Telehealth: Payer: Self-pay

## 2023-03-18 NOTE — Progress Notes (Signed)
   Care Guide Note  03/18/2023 Name: Calvin Demary Sr. MRN: [id number] DOB: 08-04-1955  Referred by: [name], MD Reason for referral : Care Coordination (Outreach to schedule with Pharm d )   Calvin Afifi Sr. is a 67 y.o. year old male who is a primary care patient of [name], MD. Calvin Limbo Sr. was referred to the pharmacist for assistance related to DM.    Successful contact was made with the patient to discuss pharmacy services including being ready for the pharmacist to call at least 5 minutes before the scheduled appointment time, to have medication bottles and any blood sugar or blood pressure readings ready for review. The patient agreed to meet with the pharmacist via with the pharmacist via telephone visit on (date/time).  03/20/2023  Penne Lash, RMA Care Guide [facility]  [place], [place] [address] Direct Dial: [phone] [email or web address]

## 2023-03-19 ENCOUNTER — Other Ambulatory Visit: Payer: Self-pay | Admitting: Internal Medicine

## 2023-03-20 ENCOUNTER — Other Ambulatory Visit: Payer: Self-pay | Admitting: Internal Medicine

## 2023-03-20 ENCOUNTER — Telehealth: Payer: Self-pay | Admitting: Pharmacist

## 2023-03-20 ENCOUNTER — Encounter: Payer: Medicare HMO | Admitting: Pharmacist

## 2023-03-20 DIAGNOSIS — E118 Type 2 diabetes mellitus with unspecified complications: Secondary | ICD-10-CM

## 2023-03-20 MED ORDER — EZETIMIBE 10 MG PO TABS: 10.0000 mg | ORAL_TABLET | Freq: Every day | ORAL | 1 refills | Status: DC

## 2023-03-20 MED ORDER — LOSARTAN POTASSIUM 100 MG PO TABS: 100.0000 mg | ORAL_TABLET | Freq: Every day | ORAL | 1 refills | Status: DC

## 2023-03-20 MED ORDER — ATORVASTATIN CALCIUM 80 MG PO TABS: 80.0000 mg | ORAL_TABLET | Freq: Every day | ORAL | 1 refills | Status: DC

## 2023-03-20 NOTE — Progress Notes (Signed)
This encounter was created in error - please disregard.

## 2023-03-20 NOTE — Progress Notes (Deleted)
03/20/2023 Name: Calvin Proffitt Sr. MRN: [id number] DOB: October 07, 1955  No chief complaint on file.   Calvin Schlesser Sr. is a 67 y.o. year old male who presented for a telephone visit.   They were referred to the pharmacist by their PCP for assistance in managing diabetes, medication access, and complex medication management.    Subjective:  Care Team: Primary Care Provider: [name], MD ; Next Scheduled Visit: *** {careteamprovider:27366}  Medication Access/Adherence  Current Pharmacy:  [id number] [place], [place] - [address] [place] [address] Phone: [phone] Fax: [phone]   Patient reports affordability concerns with their medications: {YES/NO:21197} Patient reports access/transportation concerns to their pharmacy: {YES/NO:21197} Patient reports adherence concerns with their medications:  {YES/NO:21197} ***   {Pharmacy S/O Choices:26420}   Objective:  Lab Results  Component Value Date   HGBA1C 8.2 (H) 03/13/2023    Lab Results  Component Value Date   CREATININE 1.32 03/13/2023   BUN 21 03/13/2023   NA 137 03/13/2023   K 4.0 03/13/2023   CL 99 03/13/2023   CO2 32 03/13/2023    Lab Results  Component Value Date   CHOL 164 10/10/2022   HDL 56.50 10/10/2022   LDLCALC 82 10/10/2022   LDLDIRECT 118.7 12/27/2010   TRIG 128.0 10/10/2022   CHOLHDL 3 10/10/2022    Medications Reviewed Today   Medications were not reviewed in this encounter       Assessment/Plan:   {Pharmacy A/P Choices:26421}  Follow Up Plan: ***  ***

## 2023-03-20 NOTE — Telephone Encounter (Signed)
Unsuccessful attempt to reach patient with Spanish interpreter. Patient was referred to Clinical Pharmacist Practitioner for diabetes and medication management.  Spanish interpreter LM on VM with CB# for my direct line [phone] or main office number [phone].  If patient does not return call in the next week will try to outreach again.   Noted that the following medications on patient;s med list need updated Rx's sent to his pharmacy. Updated prescription sent electronically.   Meds ordered this encounter  Medications   atorvastatin (LIPITOR) 80 MG tablet    Sig: Take 1 tablet (80 mg total) by mouth daily. To lower cholesterol and prevent worsening heart disease.    Dispense:  90 tablet    Refill:  1   ezetimibe (ZETIA) 10 MG tablet    Sig: Take 1 tablet (10 mg total) by mouth daily. To lower cholesterol and prevent worsening heart disease    Dispense:  90 tablet    Refill:  1   losartan (COZAAR) 100 MG tablet    Sig: Take 1 tablet (100 mg total) by mouth daily. For blood pressure and kidney protection.    Dispense:  90 tablet    Refill:  1

## 2023-04-16 ENCOUNTER — Other Ambulatory Visit: Payer: Self-pay | Admitting: Internal Medicine

## 2023-05-26 DIAGNOSIS — H43813 Vitreous degeneration, bilateral: Secondary | ICD-10-CM | POA: Diagnosis not present

## 2023-05-26 LAB — HM DIABETES EYE EXAM

## 2023-05-27 ENCOUNTER — Encounter: Payer: Self-pay | Admitting: Internal Medicine

## 2023-05-27 DIAGNOSIS — H43813 Vitreous degeneration, bilateral: Secondary | ICD-10-CM | POA: Insufficient documentation

## 2023-06-10 DIAGNOSIS — Z008 Encounter for other general examination: Secondary | ICD-10-CM | POA: Diagnosis not present

## 2023-07-13 ENCOUNTER — Encounter: Payer: Medicare HMO | Admitting: Internal Medicine

## 2023-07-30 DIAGNOSIS — E119 Type 2 diabetes mellitus without complications: Secondary | ICD-10-CM | POA: Diagnosis not present

## 2023-07-30 LAB — HM DIABETES EYE EXAM

## 2023-08-28 ENCOUNTER — Encounter: Payer: Medicare HMO | Admitting: Internal Medicine

## 2023-08-29 DIAGNOSIS — S299XXA Unspecified injury of thorax, initial encounter: Secondary | ICD-10-CM | POA: Diagnosis not present

## 2023-08-29 DIAGNOSIS — S4991XA Unspecified injury of right shoulder and upper arm, initial encounter: Secondary | ICD-10-CM | POA: Diagnosis not present

## 2023-08-29 DIAGNOSIS — W19XXXA Unspecified fall, initial encounter: Secondary | ICD-10-CM | POA: Diagnosis not present

## 2023-08-29 DIAGNOSIS — S4990XA Unspecified injury of shoulder and upper arm, unspecified arm, initial encounter: Secondary | ICD-10-CM | POA: Diagnosis not present

## 2023-09-02 DIAGNOSIS — M898X1 Other specified disorders of bone, shoulder: Secondary | ICD-10-CM | POA: Diagnosis not present

## 2023-09-23 ENCOUNTER — Other Ambulatory Visit: Payer: Self-pay | Admitting: Internal Medicine

## 2023-11-19 ENCOUNTER — Telehealth: Payer: Self-pay | Admitting: Internal Medicine

## 2023-11-19 NOTE — Telephone Encounter (Signed)
 Copied from CRM [id number]. Topic: Medicare AWV >> Nov 19, 2023  9:45 AM Juliana Ocean wrote: Reason for CRM: LVM 11/19/2023 to schedule AWV. Please schedule Virtual or Telehealth visits ONLY  Rosalee Collins; Care Guide Ambulatory Clinical Support [facility] Direct Dial: [phone]

## 2023-12-15 ENCOUNTER — Encounter: Payer: Self-pay | Admitting: Internal Medicine

## 2023-12-15 ENCOUNTER — Ambulatory Visit: Payer: Medicare HMO | Admitting: Internal Medicine

## 2023-12-15 VITALS — BP 116/76 | HR 55 | Temp 98.1°F | Resp 16 | Ht 73.0 in | Wt 226.0 lb

## 2023-12-15 DIAGNOSIS — E7800 Pure hypercholesterolemia, unspecified: Secondary | ICD-10-CM | POA: Diagnosis not present

## 2023-12-15 DIAGNOSIS — Z7984 Long term (current) use of oral hypoglycemic drugs: Secondary | ICD-10-CM

## 2023-12-15 DIAGNOSIS — E118 Type 2 diabetes mellitus with unspecified complications: Secondary | ICD-10-CM | POA: Diagnosis not present

## 2023-12-15 DIAGNOSIS — I10 Essential (primary) hypertension: Secondary | ICD-10-CM

## 2023-12-15 DIAGNOSIS — Z0000 Encounter for general adult medical examination without abnormal findings: Secondary | ICD-10-CM | POA: Diagnosis not present

## 2023-12-15 DIAGNOSIS — E1165 Type 2 diabetes mellitus with hyperglycemia: Secondary | ICD-10-CM | POA: Diagnosis not present

## 2023-12-15 DIAGNOSIS — Z0001 Encounter for general adult medical examination with abnormal findings: Secondary | ICD-10-CM

## 2023-12-15 MED ORDER — METFORMIN HCL 850 MG PO TABS: 850.0000 mg | ORAL_TABLET | Freq: Every day | ORAL | Status: DC

## 2023-12-15 NOTE — Progress Notes (Unsigned)
 Subjective:    Patient ID: Calvin Sproles Sr., male    DOB: [birth year], 68 y.o.   MRN: [id number]  DOS:  12/15/2023 Type of visit - description: CPX  Here for CPX Chronic medical problems addressed. Ambulatory BPs never below 07/07/1928. No recent ambulatory CBGs. Good compliance with medication. Denies any lower extremity paresthesias  Review of Systems See above   Past Medical History:  Diagnosis Date   Decreased hearing    Left   Diabetes mellitus 2009   adult onset   Glomerulonephritis 12/2008   bx provem membranous GN, sees renal q year   Grave's disease    reportedly, history of i nthe past u/s 6/11 slightly enlarged gland w/o nodules   H/O: hematuria    saw urology 3/09, neg w/u   Hyperlipemia    Hypertension    Secondary hyperparathyroidism, renal (HCC)    Dr. Arlis Lakes   Stroke [facility])    Urinary retention 01/2009   was seen at the ER and followup by urology    Past Surgical History:  Procedure Laterality Date   right knee surgery     around 2015, 2021    Current Outpatient Medications  Medication Instructions   aspirin  EC 81 mg, Daily   atorvastatin  (LIPITOR ) 80 MG tablet TAKE 1TAB BY MOUTH DAILY TO LOWER CHOLESTEROL &PREVENT WORSENING HEART DISEASE   EPINEPHrine  (EPIPEN  2-PAK) 0.3 mg, Intramuscular, As needed   ezetimibe  (ZETIA ) 10 mg, Oral, Daily, To lower cholesterol and prevent worsening heart disease   fluticasone  (FLONASE ) 50 MCG/ACT nasal spray SPRAY 2 SPRAYS INTO EACH NOSTRIL EVERY DAY   glipiZIDE  (GLUCOTROL ) 5 mg, Oral, Daily before breakfast   glucose blood (FREESTYLE INSULINX TEST) test strip Test blood sugar once daily. Dx code: E11.9   hydrochlorothiazide  (HYDRODIURIL ) 25 mg, Oral, Daily   Lancets (FREESTYLE) lancets Check blood sugar once daily   losartan  (COZAAR ) 100 mg, Oral, Daily, For blood pressure and kidney protection.   metFORMIN  (GLUCOPHAGE ) 850 mg, Oral, 2 times daily with meals   metoprolol  tartrate (LOPRESSOR ) 25 mg, Oral, 2 times  daily   pioglitazone  (ACTOS ) 45 mg, Oral, Daily   potassium chloride (MICRO-K) 10 MEQ CR capsule 10 mEq, Daily       Objective:   Physical Exam BP 116/76   Pulse (!) 55   Temp 98.1 F (36.7 C) (Oral)   Resp 16   Ht 6\' 1"  (1.854 m)   Wt 226 lb (102.5 kg)   SpO2 95%   BMI 29.82 kg/m  General: Well developed, NAD, BMI noted Neck: No  thyromegaly  HEENT:  Normocephalic . Face symmetric, atraumatic Lungs:  CTA B Normal respiratory effort, no intercostal retractions, no accessory muscle use. Heart: RRR,  no murmur.  Abdomen:  Not distended, soft, non-tender. No rebound or rigidity.   DM foot exam: No edema, onychomycosis throughout, good pedal pulses, pinprick examination WNL flu shot every fall COVID Skin: Exposed areas without rash. Not pale. Not jaundice Neurologic:  alert & oriented X3.  Speech normal, gait appropriate for age and unassisted Strength symmetric and appropriate for age.  Psych: Cognition and judgment appear intact.  Cooperative with normal attention span and concentration.  Behavior appropriate. No anxious or depressed appearing.     Assessment    Assessment DM 2009 HTN Hyperlipidemia CV: CAD:  01/2017: had CP,  stress test show an area of fixed defect, saw cardiology, likely s/p a out-of-hospital MI . Rx Plavix     CVA 02/2018 per MRI, MRA neck head (-),  echo ok; was on ASA, was rx plavix  x 3 weeks  Renal: Dr Arlis Lakes ---Glomerulonephritis 2010, BX proven ---Secondary hyperparathyroidism HOH L GU H/o Hematuria, urology w/u 09/2007 H/o Urinary retention 2010 BEE allergies, epipen  Rx 09/2019  PLAN:  Here for CPX.   -Td 2022 -  pnm shot 09-2014;  prevnar 12-2015; PNM 20: 2023 - s/p shingrix  x 2 - Vaccines I recommend: Flu shot every fall, COVID booster from 03/2023.   RSV is a consideration CCS:  Cscope 12-06 Dr Honey Lusty:  2 polyps Cscope again 08-2011, +polyp.   C-scope 01/23/2023 Dr. Honey Lusty see pathology report, + polyps.  We were advised next  in 10 years  Prostate cancer screening: No symptoms, check PSA. Diet and exercise: Counseled. Labs: CMP CBC A1c micro TSH PSA   DM: Last A1c increased to 8.2.   -On metformin , pioglitazone .   - Was off glipizide  due to hypoglycemia, Jardiance  previously was very expensive  - was referred to our clinical pharmacist  but I don't see that happened - Metformin  decreased to 1 a day per renal - Foot exam negative, encouraged to check CBGs at least daily, declining a CGM. - Plan: Check A1c, micro.  Further advised for results HTN: Reports normal ambulatory BPs, continue losartan , metoprolol , KCl, HCTZ.  Checking CMP, CBC. CAD: Asymptomatic Glomerulonephritis, history of, will get last nephrology note. RTC 3 months

## 2023-12-15 NOTE — Patient Instructions (Addendum)
 Vaccines are recommended Flu shot every fall A COVID-vaccine if not done by 03/2023 RSV is a consideration    Diabetes: You can check your sugars at different times, at least 1 time a day.: - early in AM fasting  ( blood sugar goal 70-130) - 2 hours after a meal (blood sugar goal less than 180)   Check the  blood pressure regularly Blood pressure goal:  between 110/65 and  135/85. If it is consistently higher or lower, let me know   GO TO THE LAB :  Get the blood work   Your results will be posted on MyChart with my comments  Next office visit for a checkup in 3 months Please make an appointment before you leave today

## 2023-12-16 ENCOUNTER — Encounter: Payer: Self-pay | Admitting: Internal Medicine

## 2023-12-16 ENCOUNTER — Telehealth: Payer: Self-pay

## 2023-12-16 LAB — CBC WITH DIFFERENTIAL/PLATELET
Absolute Lymphocytes: 1290 {cells}/uL (ref 850–3900)
Absolute Monocytes: 499 {cells}/uL (ref 200–950)
Basophils Absolute: 42 {cells}/uL (ref 0–200)
Basophils Relative: 0.8 %
Eosinophils Absolute: 42 {cells}/uL (ref 15–500)
Eosinophils Relative: 0.8 %
HCT: 43.5 % (ref 38.5–50.0)
Hemoglobin: 14.4 g/dL (ref 13.2–17.1)
MCH: 30.1 pg (ref 27.0–33.0)
MCHC: 33.1 g/dL (ref 32.0–36.0)
MCV: 90.8 fL (ref 80.0–100.0)
MPV: 9.4 fL (ref 7.5–12.5)
Monocytes Relative: 9.6 %
Neutro Abs: 3328 {cells}/uL (ref 1500–7800)
Neutrophils Relative %: 64 %
Platelets: 205 10*3/uL (ref 140–400)
RBC: 4.79 10*6/uL (ref 4.20–5.80)
RDW: 13.1 % (ref 11.0–15.0)
Total Lymphocyte: 24.8 %
WBC: 5.2 10*3/uL (ref 3.8–10.8)

## 2023-12-16 LAB — COMPREHENSIVE METABOLIC PANEL WITH GFR
AG Ratio: 1.6 (calc) (ref 1.0–2.5)
ALT: 19 U/L (ref 9–46)
AST: 21 U/L (ref 10–35)
Albumin: 4.2 g/dL (ref 3.6–5.1)
Alkaline phosphatase (APISO): 117 U/L (ref 35–144)
BUN: 19 mg/dL (ref 7–25)
CO2: 30 mmol/L (ref 20–32)
Calcium: 9.5 mg/dL (ref 8.6–10.3)
Chloride: 101 mmol/L (ref 98–110)
Creat: 1.26 mg/dL (ref 0.70–1.35)
Globulin: 2.7 g/dL (ref 1.9–3.7)
Glucose, Bld: 81 mg/dL (ref 65–99)
Potassium: 4.1 mmol/L (ref 3.5–5.3)
Sodium: 138 mmol/L (ref 135–146)
Total Bilirubin: 1 mg/dL (ref 0.2–1.2)
Total Protein: 6.9 g/dL (ref 6.1–8.1)
eGFR: 62 mL/min/{1.73_m2} (ref >=60)

## 2023-12-16 LAB — MICROALBUMIN / CREATININE URINE RATIO
Creatinine, Urine: 82 mg/dL (ref 20–320)
Microalb Creat Ratio: 173 mg/g{creat} — ABNORMAL HIGH (ref <30)
Microalb, Ur: 14.2 mg/dL

## 2023-12-16 LAB — TSH: TSH: 2.77 m[IU]/L (ref 0.40–4.50)

## 2023-12-16 LAB — HEMOGLOBIN A1C
Hgb A1c MFr Bld: 7.6 % — ABNORMAL HIGH (ref <5.7)
Mean Plasma Glucose: 171 mg/dL
eAG (mmol/L): 9.5 mmol/L

## 2023-12-16 LAB — PSA: PSA: 1.17 ng/mL (ref <=4.00)

## 2023-12-16 NOTE — Telephone Encounter (Signed)
 Received nephrology notes and labs from [facility]- Pt has not been seen since 11/2022. Notes placed in PCP blue folder.

## 2023-12-16 NOTE — Assessment & Plan Note (Signed)
 Here for CPX. -Td 2022 -  pnm shot 09-2014;  prevnar 12-2015; PNM 20: 2023 - s/p shingrix  x 2 - Vaccines I recommend: Flu shot every fall, COVID booster from 03/2023.   RSV is a consideration CCS:  Cscope 12-06 Dr Honey Lusty:  2 polyps Cscope again 08-2011, +polyp.   C-scope 01/23/2023 Dr. Honey Lusty . + polyps.  See pathology report, we were advised--- next in 10 years Prostate cancer screening: No symptoms, check PSA. Diet and exercise: Counseled. Labs: CMP CBC A1c micro TSH PSA

## 2023-12-16 NOTE — Assessment & Plan Note (Signed)
 Here for CPX. Other issues addressed today DM: Last A1c increased to 8.2.   -On metformin , pioglitazone .   - Was off glipizide  due to hypoglycemia, Jardiance  previously was very expensive  - was referred to our clinical pharmacist  but I don't see that happened - Metformin  decreased to 1 a day per renal - Foot exam negative, encouraged to check CBGs at least daily, declining a CGM. - Plan: Check A1c, micro.  Further advised for results HTN: Reports normal ambulatory BPs, continue losartan , metoprolol , KCl, HCTZ.  Checking CMP, CBC. CAD: Asymptomatic Glomerulonephritis, history of, will get last nephrology note. RTC 3 months

## 2023-12-18 ENCOUNTER — Ambulatory Visit: Payer: Self-pay | Admitting: Internal Medicine

## 2023-12-18 MED ORDER — DAPAGLIFLOZIN PROPANEDIOL 5 MG PO TABS: 5.0000 mg | ORAL_TABLET | Freq: Every day | ORAL | 1 refills | Status: DC

## 2023-12-18 NOTE — Telephone Encounter (Signed)
 Rx sent.

## 2023-12-18 NOTE — Telephone Encounter (Signed)
 Note from nephrology 12/05/2022 reviewed. They noted that Calvin Williams was a good option for him. Glipizide  was stopped due to hypoglycemia.  Labs from the last visit reviewed. Normal kidney function. + Microalbumin A1c 7.6. Plan:  Add Farxiga 5 mg.  Once daily.  60-month supply Left a very detailed message on his phone regards adding Farxiga, if he develops any genital rash he needs to let me know, watch for hypoglycemia which is unlikely.  Follow-up in 3 months.

## 2024-01-09 ENCOUNTER — Other Ambulatory Visit: Payer: Self-pay | Admitting: Internal Medicine

## 2024-03-02 ENCOUNTER — Emergency Department (HOSPITAL_COMMUNITY)
Admission: EM | Admit: 2024-03-02 | Discharge: 2024-03-03 | Disposition: A | Discharge status: Home / Self Care | Attending: Emergency Medicine | Admitting: Emergency Medicine

## 2024-03-02 ENCOUNTER — Ambulatory Visit (INDEPENDENT_AMBULATORY_CARE_PROVIDER_SITE_OTHER): Admitting: Family

## 2024-03-02 ENCOUNTER — Encounter (HOSPITAL_COMMUNITY): Payer: Self-pay

## 2024-03-02 ENCOUNTER — Other Ambulatory Visit: Payer: Self-pay

## 2024-03-02 VITALS — BP 133/78 | HR 51 | Temp 98.8°F | Resp 16 | Ht 73.0 in | Wt 226.0 lb

## 2024-03-02 DIAGNOSIS — E871 Hypo-osmolality and hyponatremia: Secondary | ICD-10-CM | POA: Diagnosis not present

## 2024-03-02 DIAGNOSIS — Z7982 Long term (current) use of aspirin: Secondary | ICD-10-CM | POA: Insufficient documentation

## 2024-03-02 DIAGNOSIS — E119 Type 2 diabetes mellitus without complications: Secondary | ICD-10-CM | POA: Insufficient documentation

## 2024-03-02 DIAGNOSIS — Z8673 Personal history of transient ischemic attack (TIA), and cerebral infarction without residual deficits: Secondary | ICD-10-CM | POA: Insufficient documentation

## 2024-03-02 DIAGNOSIS — M542 Cervicalgia: Secondary | ICD-10-CM | POA: Insufficient documentation

## 2024-03-02 DIAGNOSIS — Z7984 Long term (current) use of oral hypoglycemic drugs: Secondary | ICD-10-CM | POA: Diagnosis not present

## 2024-03-02 DIAGNOSIS — N289 Disorder of kidney and ureter, unspecified: Secondary | ICD-10-CM | POA: Diagnosis not present

## 2024-03-02 DIAGNOSIS — Z79899 Other long term (current) drug therapy: Secondary | ICD-10-CM | POA: Diagnosis not present

## 2024-03-02 DIAGNOSIS — I10 Essential (primary) hypertension: Secondary | ICD-10-CM | POA: Insufficient documentation

## 2024-03-02 DIAGNOSIS — I6782 Cerebral ischemia: Secondary | ICD-10-CM | POA: Diagnosis not present

## 2024-03-02 LAB — BASIC METABOLIC PANEL WITH GFR
Anion gap: 10 (ref 5–15)
BUN: 22 mg/dL (ref 8–23)
CO2: 24 mmol/L (ref 22–32)
Calcium: 9.1 mg/dL (ref 8.9–10.3)
Chloride: 100 mmol/L (ref 98–111)
Creatinine, Ser: 1.27 mg/dL — ABNORMAL HIGH (ref 0.61–1.24)
GFR, Estimated: >60 mL/min (ref >60)
Glucose, Bld: 186 mg/dL — ABNORMAL HIGH (ref 70–99)
Potassium: 3.7 mmol/L (ref 3.5–5.1)
Sodium: 134 mmol/L — ABNORMAL LOW (ref 135–145)

## 2024-03-02 LAB — CBC WITH DIFFERENTIAL/PLATELET
Abs Immature Granulocytes: 0.02 K/uL (ref 0.00–0.07)
Basophils Absolute: 0 K/uL (ref 0.0–0.1)
Basophils Relative: 1 %
Eosinophils Absolute: 0 K/uL (ref 0.0–0.5)
Eosinophils Relative: 1 %
HCT: 41.3 % (ref 39.0–52.0)
Hemoglobin: 14.1 g/dL (ref 13.0–17.0)
Immature Granulocytes: 0 %
Lymphocytes Relative: 16 %
Lymphs Abs: 1 K/uL (ref 0.7–4.0)
MCH: 30.9 pg (ref 26.0–34.0)
MCHC: 34.1 g/dL (ref 30.0–36.0)
MCV: 90.6 fL (ref 80.0–100.0)
Monocytes Absolute: 0.5 K/uL (ref 0.1–1.0)
Monocytes Relative: 9 %
Neutro Abs: 4.5 K/uL (ref 1.7–7.7)
Neutrophils Relative %: 73 %
Platelets: 161 K/uL (ref 150–400)
RBC: 4.56 MIL/uL (ref 4.22–5.81)
RDW: 14.4 % (ref 11.5–15.5)
WBC: 6 K/uL (ref 4.0–10.5)
nRBC: 0 % (ref 0.0–0.2)

## 2024-03-02 MED ORDER — OXYCODONE HCL 5 MG PO TABS
5.0000 mg | ORAL_TABLET | Freq: Once | ORAL | Status: AC
  Administered 2024-03-02: 5 mg via ORAL
  Filled 2024-03-02: qty 1

## 2024-03-02 MED ORDER — MELOXICAM 7.5 MG PO TABS: 7.5000 mg | ORAL_TABLET | Freq: Every day | ORAL | 0 refills | Status: DC

## 2024-03-02 NOTE — ED Triage Notes (Signed)
 Patient went to primary about his neck pain that started on Monday.  Was prescribed meloxicam  today and was told to notify his PCP if pain does not get better on the course of meloxicam .  Patient came in reporting wanting and xray.  Patient also complains of right ear pain.

## 2024-03-02 NOTE — ED Provider Notes (Signed)
 [place] EMERGENCY DEPARTMENT AT [facility] Provider Note   CSN: [id number] Arrival date & time: 03/02/24  1519     Patient presents with: Neck Pain   Calvin Zimmers Sr. is a 68 y.o. male.   The history is provided by the patient and the spouse. A language interpreter was used [place] [phone]).  Neck Pain  He has history of hypertension, diabetes, hyperlipidemia, Graves' disease, stroke, glomerulonephritis, secondary hyper parathyroidism and comes in complaining of pain in his anterior neck radiating up to the head both occipital region and preauricular region.  This started about 2 days ago and has been constant.  He did see his primary care provider ordered meloxicam  without any relief.  He also received a dose of hydrocodone -acetaminophen  at triage which only gave slight relief.  He states that he had similar symptoms about 6 years ago and was diagnosed with a stroke at that time.  Review of his records shows that he had some speech problems then which are not present now.  He denies any weakness or numbness.  He denies fever or chills.  He does endorse a sore throat and slight difficulty swallowing.    Prior to Admission medications   Medication Sig Start Date End Date Taking? Authorizing Provider  aspirin  EC 81 MG tablet Take 81 mg by mouth daily. Swallow whole.    [provider]  atorvastatin  (LIPITOR ) 80 MG tablet TAKE 1TAB BY MOUTH DAILY TO LOWER CHOLESTEROL &PREVENT WORSENING HEART DISEASE 09/23/23   Amon Aloysius BRAVO, MD  EPINEPHrine  0.3 mg/0.3 mL IJ SOAJ injection Inject 0.3 mg into the muscle as needed for anaphylaxis. 01/11/24   Paz, Jose E, MD  fluticasone  (FLONASE ) 50 MCG/ACT nasal spray Place 2 sprays into both nostrils daily. 01/11/24   Amon Aloysius BRAVO, MD  glucose blood (FREESTYLE INSULINX TEST) test strip Test blood sugar once daily. Dx code: E11.9 06/20/16   Amon Aloysius BRAVO, MD  hydrochlorothiazide  (HYDRODIURIL ) 25 MG tablet TAKE 1 TABLET (25 MG TOTAL) BY MOUTH  DAILY. 09/23/23   Amon Aloysius BRAVO, MD  Lancets (FREESTYLE) lancets Check blood sugar once daily 06/20/16   Paz, Jose E, MD  losartan  (COZAAR ) 100 MG tablet TAKE 1 TABLET (100 MG TOTAL) BY MOUTH DAILY. FOR BLOOD PRESSURE AND KIDNEY PROTECTION. 09/23/23   Amon Aloysius BRAVO, MD  meloxicam  (MOBIC ) 7.5 MG tablet Take 1 tablet (7.5 mg total) by mouth daily. 03/02/24   O'Sullivan, Melissa, NP  metFORMIN  (GLUCOPHAGE ) 850 MG tablet Take 1 tablet (850 mg total) by mouth daily with breakfast. 12/15/23   Amon Aloysius BRAVO, MD  metoprolol  tartrate (LOPRESSOR ) 25 MG tablet Take 1 tablet (25 mg total) by mouth 2 (two) times daily. 03/20/23   Amon Aloysius BRAVO, MD  pioglitazone  (ACTOS ) 45 MG tablet Take 1 tablet (45 mg total) by mouth daily. 04/16/23   Paz, Jose E, MD  potassium chloride (MICRO-K) 10 MEQ CR capsule Take 10 mEq by mouth daily. 03/07/23   [provider]    Allergies: Bee venom and Penicillins    Review of Systems  Musculoskeletal:  Positive for neck pain.  All other systems reviewed and are negative.   Updated Vital Signs BP (!) 171/95 (BP Location: Right Arm)   Pulse 63   Temp 98.2 F (36.8 C)   Resp 18   Ht 6' 1 (1.854 m)   Wt 104.3 kg   SpO2 99%   BMI 30.34 kg/m   Physical Exam Vitals and nursing note reviewed.  68 year old male, resting comfortably and in no acute distress. Vital signs are significant for elevated blood pressure. Oxygen saturation is 99%, which is normal. Head is normocephalic and atraumatic. PERRLA, EOMI. Oropharynx is clear.  There is no pooling of secretions.  Phonation is normal.  There is mild tenderness to palpation in the preauricular area bilaterally.  Mild exophthalmos is present. Neck is mildly tender in the submandibular area bilaterally but no appreciable swelling.  There is no tenderness over the cervical spine.  There is no stridor. Back is nontender and there is no CVA tenderness. Lungs are clear without rales, wheezes, or rhonchi. Chest is nontender. Heart  has regular rate and rhythm without murmur. Abdomen is soft, flat, nontender without masses or hepatosplenomegaly and peristalsis is normoactive. Extremities have no cyanosis or edema, full range of motion is present. Skin is warm and dry without rash. Neurologic: Awake and alert.  Speech is normal.  Cranial nerves are intact.  Strength is 5/5 in all 4 extremities.  Sensation is normal throughout.  (all labs ordered are listed, but only abnormal results are displayed) Labs Reviewed  BASIC METABOLIC PANEL WITH GFR - Abnormal; Notable for the following components:      Result Value   Sodium 134 (*)    Glucose, Bld 186 (*)    Creatinine, Ser 1.27 (*)    All other components within normal limits  GROUP A STREP BY PCR  CBC WITH DIFFERENTIAL/PLATELET  SEDIMENTATION RATE    Radiology: MR BRAIN WO CONTRAST Result Date: 03/03/2024 CLINICAL DATA:  Initial evaluation for acute neuro deficit, stroke suspected. EXAM: MRI HEAD WITHOUT CONTRAST TECHNIQUE: Multiplanar, multiecho pulse sequences of the brain and surrounding structures were obtained without intravenous contrast. COMPARISON:  Prior study from 02/13/2018. FINDINGS: Brain: Cerebral volume within normal limits. Mild T2/FLAIR signal abnormality noted involving the periventricular and deep white matter, consistent with chronic small vessel ischemic disease, mild for age. Few small remote lacunar infarcts noted about the right basal ganglia and left thalamus. No abnormal foci of restricted diffusion to suggest acute or subacute ischemia. Gray-white matter differentiation maintained. No areas of chronic cortical infarction. No acute or chronic intracranial blood products. No mass lesion, midline shift or mass effect no hydrocephalus or extra-axial fluid collection. Pituitary gland within normal limits. Vascular: Major intracranial vascular flow voids are maintained. Skull and upper cervical spine: Craniocervical junction within normal limits. Bone marrow  signal intensity overall within normal limits. No scalp soft tissue abnormality. Sinuses/Orbits: Globes and orbital soft tissues within normal limits. Scattered mucosal thickening present about the ethmoidal air cells. Paranasal sinuses are otherwise largely clear. No significant mastoid effusion. Other: None. IMPRESSION: 1. No acute intracranial abnormality. 2. Mild chronic microvascular ischemic disease with a few small remote lacunar infarcts about the right basal ganglia and left thalamus. Electronically Signed   By: Morene Hoard M.D.   On: 03/03/2024 02:50     Procedures   Medications Ordered in the ED  oxyCODONE  (Oxy IR/ROXICODONE ) immediate release tablet 5 mg (5 mg Oral Given 03/02/24 1644)  ibuprofen  (ADVIL ) tablet 400 mg (400 mg Oral Given 03/03/24 0314)  acetaminophen  (TYLENOL ) tablet 650 mg (650 mg Oral Given 03/03/24 0314)  methocarbamol  (ROBAXIN ) tablet 500 mg (500 mg Oral Given 03/03/24 0314)                                    Medical Decision Making Amount and/or Complexity  of Data Reviewed Labs: ordered. Radiology: ordered.  Risk OTC drugs. Prescription drug management.   Neck and head pain of uncertain cause, no obvious source noted on physical exam.  However, given his history of stroke with similar symptoms I feel I need to get an MRI scan to rule out stroke.  Also can need to consider polymyalgia rheumatica.  Consider streptococcal infection-strep PCR ordered.  I have reviewed his laboratory tests, and my interpretation is borderline hyponatremia which is not felt to be clinically significant, stable mild renal insufficiency, normal CBC.  I reviewed his past records showing hospitalization on 02/12/2018 for stroke with presenting complaint of neck pain and slurred speech.  Strep PCR is negative-no evidence of streptococcal disease.  Sedimentation rate is normal-no evidence of polymyalgia rheumatica.  MRI of the brain shows no acute intracranial abnormality but  evidence of remote lacunar infarcts.  I have independently viewed the images, and agree with radiologist's interpretation.  Patient still is complaining of his pain.  I am ordering a therapeutic trial of ibuprofen , acetaminophen , methocarbamol .  He had good relief of pain with above-noted treatment.  I am discharging him with a prescription for tizanidine  and advised him to use topical ice and heat, use over-the-counter ibuprofen  and acetaminophen .  Return if symptoms are worsening.     Final diagnoses:  Neck pain  Hyponatremia  Renal insufficiency    ED Discharge Orders          Ordered    tizanidine  (ZANAFLEX ) 2 MG capsule  3 times daily PRN        03/03/24 0401               Raford Lenis, MD 03/03/24 [phone]

## 2024-03-02 NOTE — ED Triage Notes (Signed)
 Denies blurred vision, numbness, tingling.

## 2024-03-02 NOTE — Assessment & Plan Note (Addendum)
 New. Has normal renal function. Will give short course of meloxicam . Advised pt to let me know if his pain worsens or if it fails to improve. Otalgia likely referred pain from neck. No sign of OM. Just TM scarring.

## 2024-03-02 NOTE — Progress Notes (Signed)
 Subjective:     Patient ID: Calvin Rundle Sr., male    DOB: 1955/11/03, 68 y.o.   MRN: [id number]  Chief Complaint  Patient presents with   Neck Pain    Patient complains of neck pain for about 3 days    Neck Pain     Discussed the use of AI scribe software for clinical note transcription with the patient, who gave verbal consent to proceed.  History of Present Illness  Calvin Achillies Buehl. is a 68 year old male who presents with neck pain radiating to the ear.  He experiences neck pain radiating to the ear, beginning two days ago, possibly related to his sleeping position. Pain occurs with head movements, especially when tilting back or towards the shoulder. No prior neck issues or use of over-the-counter medications. No radiation of pain to the arms. He has difficulty hearing in the affected ear. He works part-time as a Civil engineer, contracting and is concerned about his ability to work due to the neck pain.     Health Maintenance Due  Topic Date Due   Medicare Annual Wellness (AWV)  12/12/2023   INFLUENZA VACCINE  02/05/2024    Past Medical History:  Diagnosis Date   Decreased hearing    Left   Diabetes mellitus 2009   adult onset   Glomerulonephritis 12/2008   bx provem membranous GN, sees renal q year   Grave's disease    reportedly, history of i nthe past u/s 6/11 slightly enlarged gland w/o nodules   H/O: hematuria    saw urology 3/09, neg w/u   Hyperlipemia    Hypertension    Secondary hyperparathyroidism, renal (HCC)    Dr. Douglass   Stroke [facility])    Urinary retention 01/2009   was seen at the ER and followup by urology    Past Surgical History:  Procedure Laterality Date   right knee surgery     around 2015, 2021    Family History  Problem Relation Age of Onset   Diabetes Other        GM   Diabetes Maternal Grandmother    Hypertension Maternal Grandmother    Healthy Mother    Heart attack Neg Hx    Colon cancer Neg Hx    Prostate cancer Neg Hx      Social History   Socioeconomic History   Marital status: Married    Spouse name: Not on file   Number of children: 3   Years of education: Not on file   Highest education level: Not on file  Occupational History   Occupation: Psychologist, occupational- works part time  Tobacco Use   Smoking status: Former    Current packs/day: 0.00    Types: Cigarettes    Quit date: 04/17/1989    Years since quitting: 34.8   Smokeless tobacco: Never  Substance and Sexual Activity   Alcohol use: No   Drug use: No   Sexual activity: Not on file  Other Topics Concern   Not on file  Social History Narrative   Original from [place] , moved to [place] in the 80s , lives w/ wife   Social Drivers of Corporate investment banker Strain: Low Risk  (11/26/2021)   Overall Financial Resource Strain (CARDIA)    Difficulty of Paying Living Expenses: Not hard at all  Food Insecurity: No Food Insecurity (12/12/2022)   Hunger Vital Sign    Worried About Running Out of Food in the Last Year: Never true  Ran Out of Food in the Last Year: Never true  Transportation Needs: No Transportation Needs (12/12/2022)   PRAPARE - Administrator, Civil Service (Medical): No    Lack of Transportation (Non-Medical): No  Physical Activity: Sufficiently Active (11/26/2021)   Exercise Vital Sign    Days of Exercise per Week: 7 days    Minutes of Exercise per Session: 40 min  Stress: No Stress Concern Present (11/26/2021)   [employer] of Occupational Health - Occupational Stress Questionnaire    Feeling of Stress : Not at all  Social Connections: Moderately Integrated (11/26/2021)   Social Connection and Isolation Panel    Frequency of Communication with Friends and Family: Three times a week    Frequency of Social Gatherings with Friends and Family: Once a week    Attends Religious Services: More than 4 times per year    Active Member of [employer] or Organizations: No    Attends Banker Meetings: Never    Marital  Status: Married  Catering manager Violence: Not At Risk (11/26/2021)   Humiliation, Afraid, Rape, and Kick questionnaire    Fear of Current or Ex-Partner: No    Emotionally Abused: No    Physically Abused: No    Sexually Abused: No    Outpatient Medications Prior to Visit  Medication Sig Dispense Refill   aspirin  EC 81 MG tablet Take 81 mg by mouth daily. Swallow whole.     atorvastatin  (LIPITOR ) 80 MG tablet TAKE 1TAB BY MOUTH DAILY TO LOWER CHOLESTEROL &PREVENT WORSENING HEART DISEASE 90 tablet 1   EPINEPHrine  0.3 mg/0.3 mL IJ SOAJ injection Inject 0.3 mg into the muscle as needed for anaphylaxis. 2 each 2   fluticasone  (FLONASE ) 50 MCG/ACT nasal spray Place 2 sprays into both nostrils daily. 48 mL 1   glucose blood (FREESTYLE INSULINX TEST) test strip Test blood sugar once daily. Dx code: E11.9 100 each 12   hydrochlorothiazide  (HYDRODIURIL ) 25 MG tablet TAKE 1 TABLET (25 MG TOTAL) BY MOUTH DAILY. 90 tablet 1   Lancets (FREESTYLE) lancets Check blood sugar once daily 100 each 12   losartan  (COZAAR ) 100 MG tablet TAKE 1 TABLET (100 MG TOTAL) BY MOUTH DAILY. FOR BLOOD PRESSURE AND KIDNEY PROTECTION. 90 tablet 1   metFORMIN  (GLUCOPHAGE ) 850 MG tablet Take 1 tablet (850 mg total) by mouth daily with breakfast.     metoprolol  tartrate (LOPRESSOR ) 25 MG tablet Take 1 tablet (25 mg total) by mouth 2 (two) times daily. 180 tablet 1   pioglitazone  (ACTOS ) 45 MG tablet Take 1 tablet (45 mg total) by mouth daily. 90 tablet 1   potassium chloride (MICRO-K) 10 MEQ CR capsule Take 10 mEq by mouth daily.     dapagliflozin  propanediol (FARXIGA ) 5 MG TABS tablet Take 1 tablet (5 mg total) by mouth daily before breakfast. 90 tablet 1   ezetimibe  (ZETIA ) 10 MG tablet TAKE 1 TABLET (10 MG TOTAL) BY MOUTH DAILY. TO LOWER CHOLESTEROL AND PREVENT WORSENING HEART DISEASE 90 tablet 1   No facility-administered medications prior to visit.    Allergies  Allergen Reactions   Bee Venom Other (See Comments)     Unknown- Pt given Epi-pen   Penicillins     Unsure if this drug was the cause of rash    Review of Systems  Musculoskeletal:  Positive for neck pain.       Objective:    Physical Exam Constitutional:      Appearance: Normal appearance.  HENT:  Right Ear: Ear canal normal. Tympanic membrane is scarred. Tympanic membrane is not erythematous.     Left Ear: Tympanic membrane and ear canal normal.  Cardiovascular:     Rate and Rhythm: Normal rate.  Pulmonary:     Effort: Pulmonary effort is normal.  Musculoskeletal:     Cervical back: Normal. No swelling or tenderness.     Comments: Increased pain with neck extension  Neurological:     Mental Status: He is alert.      BP 133/78 (BP Location: Right Arm, Patient Position: Sitting, Cuff Size: Normal)   Pulse (!) 51   Temp 98.8 F (37.1 C) (Oral)   Resp 16   Ht 6' 1 (1.854 m)   Wt 226 lb (102.5 kg)   SpO2 100%   BMI 29.82 kg/m  Wt Readings from Last 3 Encounters:  03/02/24 226 lb (102.5 kg)  12/15/23 226 lb (102.5 kg)  03/13/23 232 lb 8 oz (105.5 kg)       Assessment & Plan:   Problem List Items Addressed This Visit       Unprioritized   Neck pain - Primary   New. Has normal renal function. Will give short course of meloxicam . Advised pt to let me know if his pain worsens or if it fails to improve. Otalgia likely referred pain from neck. No sign of OM. Just TM scarring.       Relevant Medications   meloxicam  (MOBIC ) 7.5 MG tablet    I have discontinued Calvin Farabee Sr.'s ezetimibe  and dapagliflozin  propanediol. I am also having him start on meloxicam . Additionally, I am having him maintain his glucose blood, freestyle, aspirin  EC, potassium chloride, metoprolol  tartrate, pioglitazone , hydrochlorothiazide , losartan , atorvastatin , metFORMIN , EPINEPHrine , and fluticasone .  Meds ordered this encounter  Medications   meloxicam  (MOBIC ) 7.5 MG tablet    Sig: Take 1 tablet (7.5 mg total) by mouth daily.     Dispense:  10 tablet    Refill:  0    Supervising Provider:   DOMENICA BLACKBIRD A [[id number]]

## 2024-03-02 NOTE — ED Provider Notes (Incomplete)
 [place] EMERGENCY DEPARTMENT AT [facility] Provider Note   CSN: [id number] Arrival date & time: 03/02/24  1519     Patient presents with: Neck Pain   Calvin Littler Sr. is a 68 y.o. male.  {Add pertinent medical, surgical, social history, OB history to YEP:67052} The history is provided by the patient and the spouse. A language interpreter was used.  Neck Pain  He has history of hypertension, diabetes, hyperlipidemia, Graves' disease, stroke, glomerulonephritis, secondary hyper parathyroidism and comes in complaining of pain in his anterior neck radiating up to the head both occipital region and preauricular region.  This started about 2 days ago and has been constant.  He did see his primary care provider ordered meloxicam  without any relief.  He also received a dose of hydrocodone -acetaminophen  at triage which only gave slight relief.  He states that he had similar symptoms about 6 years ago and was diagnosed with a stroke at that time.  Review of his records shows that he had some speech problems then which are not present now.  He denies any weakness or numbness.  He denies fever or chills.  He does endorse a sore throat and slight difficulty swallowing.    Prior to Admission medications   Medication Sig Start Date End Date Taking? Authorizing Provider  aspirin  EC 81 MG tablet Take 81 mg by mouth daily. Swallow whole.    [provider]  atorvastatin  (LIPITOR ) 80 MG tablet TAKE 1TAB BY MOUTH DAILY TO LOWER CHOLESTEROL &PREVENT WORSENING HEART DISEASE 09/23/23   Amon Aloysius BRAVO, MD  EPINEPHrine  0.3 mg/0.3 mL IJ SOAJ injection Inject 0.3 mg into the muscle as needed for anaphylaxis. 01/11/24   Amon Aloysius BRAVO, MD  fluticasone  (FLONASE ) 50 MCG/ACT nasal spray Place 2 sprays into both nostrils daily. 01/11/24   Amon Aloysius BRAVO, MD  glucose blood (FREESTYLE INSULINX TEST) test strip Test blood sugar once daily. Dx code: E11.9 06/20/16   Amon Aloysius BRAVO, MD  hydrochlorothiazide   (HYDRODIURIL ) 25 MG tablet TAKE 1 TABLET (25 MG TOTAL) BY MOUTH DAILY. 09/23/23   Amon Aloysius BRAVO, MD  Lancets (FREESTYLE) lancets Check blood sugar once daily 06/20/16   Amon Aloysius BRAVO, MD  losartan  (COZAAR ) 100 MG tablet TAKE 1 TABLET (100 MG TOTAL) BY MOUTH DAILY. FOR BLOOD PRESSURE AND KIDNEY PROTECTION. 09/23/23   Amon Aloysius BRAVO, MD  meloxicam  (MOBIC ) 7.5 MG tablet Take 1 tablet (7.5 mg total) by mouth daily. 03/02/24   O'Sullivan, Melissa, NP  metFORMIN  (GLUCOPHAGE ) 850 MG tablet Take 1 tablet (850 mg total) by mouth daily with breakfast. 12/15/23   Paz, Jose E, MD  metoprolol  tartrate (LOPRESSOR ) 25 MG tablet Take 1 tablet (25 mg total) by mouth 2 (two) times daily. 03/20/23   Amon Aloysius BRAVO, MD  pioglitazone  (ACTOS ) 45 MG tablet Take 1 tablet (45 mg total) by mouth daily. 04/16/23   Paz, Jose E, MD  potassium chloride (MICRO-K) 10 MEQ CR capsule Take 10 mEq by mouth daily. 03/07/23   [provider]    Allergies: Bee venom and Penicillins    Review of Systems  Musculoskeletal:  Positive for neck pain.  All other systems reviewed and are negative.   Updated Vital Signs BP (!) 171/95 (BP Location: Right Arm)   Pulse 63   Temp 98.2 F (36.8 C)   Resp 18   Ht 6' 1 (1.854 m)   Wt 104.3 kg   SpO2 99%   BMI 30.34 kg/m   Physical Exam  Vitals and nursing note reviewed.   68 year old male, resting comfortably and in no acute distress. Vital signs are significant for elevated blood pressure. Oxygen saturation is 99%, which is normal. Head is normocephalic and atraumatic. PERRLA, EOMI. Oropharynx is clear.  There is mild tenderness to palpation in the preauricular area bilaterally.  Mild exophthalmos is present. Neck is mildly tender in the submandibular area bilaterally but no appreciable swelling.  There is no tenderness over the cervical spine. Back is nontender and there is no CVA tenderness. Lungs are clear without rales, wheezes, or rhonchi. Chest is nontender. Heart has regular rate  and rhythm without murmur. Abdomen is soft, flat, nontender without masses or hepatosplenomegaly and peristalsis is normoactive. Extremities have no cyanosis or edema, full range of motion is present. Skin is warm and dry without rash. Neurologic: Mental status is normal, cranial nerves are intact, there are no motor or sensory deficits.  (all labs ordered are listed, but only abnormal results are displayed) Labs Reviewed  BASIC METABOLIC PANEL WITH GFR - Abnormal; Notable for the following components:      Result Value   Sodium 134 (*)    Glucose, Bld 186 (*)    Creatinine, Ser 1.27 (*)    All other components within normal limits  CBC WITH DIFFERENTIAL/PLATELET    EKG: None  Radiology: No results found.  {Document cardiac monitor, telemetry assessment procedure when appropriate:32947} Procedures   Medications Ordered in the ED  oxyCODONE  (Oxy IR/ROXICODONE ) immediate release tablet 5 mg (5 mg Oral Given 03/02/24 1644)      {Click here for ABCD2, HEART and other calculators REFRESH Note before signing:1}                              Medical Decision Making  ***  {Document critical care time when appropriate  Document review of labs and clinical decision tools ie CHADS2VASC2, etc  Document your independent review of radiology images and any outside records  Document your discussion with family members, caretakers and with consultants  Document social determinants of health affecting pt's care  Document your decision making why or why not admission, treatments were needed:32947:::1}   Final diagnoses:  None    ED Discharge Orders     None

## 2024-03-02 NOTE — ED Notes (Signed)
 ED Provider at bedside.

## 2024-03-02 NOTE — ED Provider Triage Note (Signed)
 Emergency Medicine Provider Triage Evaluation Note  Calvin Osborn Sr. , a 68 y.o. male  was evaluated in triage.  Pt complains of neck pain, ear pain.  Denies trauma/injury.  Patient was seen by primary care earlier today and prescribed meloxicam  however pain has still persisted.  Denies fever, chills, chest pain, shortness of breath.  Review of Systems  Positive: Neck pain, right ear pain Negative: Fever, chills, chest pain, shortness of breath, abdominal pain, nausea, vomiting  Physical Exam  BP (!) 148/82 (BP Location: Right Arm)   Pulse 66   Temp 98.4 F (36.9 C) (Oral)   Resp 16   Ht 6' 1 (1.854 m)   Wt 104.3 kg   SpO2 100%   BMI 30.34 kg/m  Gen:   Awake, no distress   Resp:  Normal effort  MSK:   Moves extremities without difficulty  Other:  Right tympanic membrane appears scarred and slightly erythematous, reduced range of motion of neck when looking left, right, up, and down, neck tender to palpation on the right side  Medical Decision Making  Medically screening exam initiated at 4:33 PM.  Appropriate orders placed.  Calvin Patee Sr. was informed that the remainder of the evaluation will be completed by another provider, this initial triage assessment does not replace that evaluation, and the importance of remaining in the ED until their evaluation is complete.  Orders: CBC, BMP, oxycodone  for pain relief   Janetta Terrall FALCON, PA-C 03/02/24 1643

## 2024-03-03 ENCOUNTER — Emergency Department (HOSPITAL_COMMUNITY)

## 2024-03-03 DIAGNOSIS — I6782 Cerebral ischemia: Secondary | ICD-10-CM | POA: Diagnosis not present

## 2024-03-03 DIAGNOSIS — Z8673 Personal history of transient ischemic attack (TIA), and cerebral infarction without residual deficits: Secondary | ICD-10-CM | POA: Diagnosis not present

## 2024-03-03 LAB — GROUP A STREP BY PCR: Group A Strep by PCR: NOT DETECTED

## 2024-03-03 LAB — SEDIMENTATION RATE: Sed Rate: 6 mm/h (ref 0–16)

## 2024-03-03 MED ORDER — METHOCARBAMOL 500 MG PO TABS
500.0000 mg | ORAL_TABLET | Freq: Once | ORAL | Status: AC
  Administered 2024-03-03: 500 mg via ORAL
  Filled 2024-03-03: qty 1

## 2024-03-03 MED ORDER — IBUPROFEN 400 MG PO TABS
400.0000 mg | ORAL_TABLET | Freq: Once | ORAL | Status: AC
  Administered 2024-03-03: 400 mg via ORAL
  Filled 2024-03-03: qty 1

## 2024-03-03 MED ORDER — TIZANIDINE HCL 2 MG PO CAPS: 2.0000 mg | ORAL_CAPSULE | Freq: Three times a day (TID) | ORAL | 0 refills | Status: AC | PRN

## 2024-03-03 MED ORDER — ACETAMINOPHEN 325 MG PO TABS
650.0000 mg | ORAL_TABLET | Freq: Once | ORAL | Status: AC
  Administered 2024-03-03: 650 mg via ORAL
  Filled 2024-03-03: qty 2

## 2024-03-03 NOTE — Discharge Instructions (Signed)
 Intente aplicar hielo o calor segn sea necesario.  Tome acetaminofn o ibuprofeno segn sea necesario para Engineer, materials. Tambin puede agregar tizanidina, un relajante muscular. Todos estos medicamentos actan en conjunto.

## 2024-03-18 ENCOUNTER — Ambulatory Visit: Admitting: Internal Medicine

## 2024-04-22 ENCOUNTER — Ambulatory Visit: Admitting: Internal Medicine

## 2024-06-08 ENCOUNTER — Other Ambulatory Visit: Payer: Self-pay | Admitting: Internal Medicine

## 2024-06-08 DIAGNOSIS — E118 Type 2 diabetes mellitus with unspecified complications: Secondary | ICD-10-CM

## 2024-06-08 MED ORDER — METOPROLOL TARTRATE 25 MG PO TABS: 25.0000 mg | ORAL_TABLET | Freq: Two times a day (BID) | ORAL | 1 refills | Status: AC

## 2024-06-08 MED ORDER — METFORMIN HCL 850 MG PO TABS: 850.0000 mg | ORAL_TABLET | Freq: Two times a day (BID) | ORAL | 1 refills | Status: AC

## 2024-06-08 NOTE — Addendum Note (Signed)
 Addended by: ESTELLE GILLIS D on: 06/08/2024 12:54 PM   Modules accepted: Orders

## 2024-07-13 ENCOUNTER — Other Ambulatory Visit: Payer: Self-pay | Admitting: Internal Medicine

## 2024-08-19 ENCOUNTER — Ambulatory Visit: Admitting: Internal Medicine

## 2024-09-07 ENCOUNTER — Ambulatory Visit: Admitting: Internal Medicine
